# Patient Record
Sex: Male | Born: 1945 | Race: White | Hispanic: No | Smoking: Former smoker
Health system: Southern US, Community
[De-identification: ages and names within clinical notes are randomized; demographics above are authoritative.]

## PROBLEM LIST (undated history)

## (undated) DIAGNOSIS — L570 Actinic keratosis: Secondary | ICD-10-CM

## (undated) DIAGNOSIS — H269 Unspecified cataract: Secondary | ICD-10-CM

## (undated) DIAGNOSIS — H409 Unspecified glaucoma: Secondary | ICD-10-CM

## (undated) DIAGNOSIS — G4731 Primary central sleep apnea: Secondary | ICD-10-CM

## (undated) DIAGNOSIS — G4733 Obstructive sleep apnea (adult) (pediatric): Secondary | ICD-10-CM

## (undated) DIAGNOSIS — I10 Essential (primary) hypertension: Secondary | ICD-10-CM

## (undated) DIAGNOSIS — E079 Disorder of thyroid, unspecified: Secondary | ICD-10-CM

## (undated) DIAGNOSIS — M1000 Idiopathic gout, unspecified site: Secondary | ICD-10-CM

## (undated) DIAGNOSIS — M1990 Unspecified osteoarthritis, unspecified site: Secondary | ICD-10-CM

## (undated) DIAGNOSIS — T7840XA Allergy, unspecified, initial encounter: Secondary | ICD-10-CM

## (undated) DIAGNOSIS — G4730 Sleep apnea, unspecified: Secondary | ICD-10-CM

## (undated) DIAGNOSIS — C801 Malignant (primary) neoplasm, unspecified: Secondary | ICD-10-CM

## (undated) DIAGNOSIS — F32A Depression, unspecified: Secondary | ICD-10-CM

## (undated) DIAGNOSIS — Z974 Presence of external hearing-aid: Secondary | ICD-10-CM

## (undated) HISTORY — DX: Depression, unspecified: F32.A

## (undated) HISTORY — DX: Essential (primary) hypertension: I10

## (undated) HISTORY — DX: Malignant (primary) neoplasm, unspecified: C80.1

## (undated) HISTORY — PX: VASECTOMY: SHX75

## (undated) HISTORY — PX: MASTOIDECTOMY: SHX711

## (undated) HISTORY — DX: Unspecified cataract: H26.9

## (undated) HISTORY — DX: Allergy, unspecified, initial encounter: T78.40XA

## (undated) HISTORY — DX: Disorder of thyroid, unspecified: E07.9

## (undated) HISTORY — DX: Unspecified osteoarthritis, unspecified site: M19.90

## (undated) HISTORY — PX: OTHER SURGICAL HISTORY: SHX169

## (undated) HISTORY — DX: Actinic keratosis: L57.0

## (undated) HISTORY — DX: Unspecified glaucoma: H40.9

## (undated) HISTORY — PX: COSMETIC SURGERY: SHX468

---

## 2001-09-12 ENCOUNTER — Encounter: Payer: Self-pay | Admitting: Neurosurgery

## 2001-09-12 ENCOUNTER — Encounter: Admission: RE | Admit: 2001-09-12 | Discharge: 2001-09-12 | Payer: Self-pay | Admitting: Neurosurgery

## 2001-09-26 ENCOUNTER — Encounter: Admission: RE | Admit: 2001-09-26 | Discharge: 2001-09-26 | Payer: Self-pay | Admitting: Neurosurgery

## 2001-09-26 ENCOUNTER — Encounter: Payer: Self-pay | Admitting: Neurosurgery

## 2001-10-10 ENCOUNTER — Encounter: Admission: RE | Admit: 2001-10-10 | Discharge: 2001-10-10 | Payer: Self-pay | Admitting: Neurosurgery

## 2001-10-10 ENCOUNTER — Encounter: Payer: Self-pay | Admitting: Neurosurgery

## 2003-10-09 ENCOUNTER — Ambulatory Visit (HOSPITAL_BASED_OUTPATIENT_CLINIC_OR_DEPARTMENT_OTHER): Admission: RE | Admit: 2003-10-09 | Discharge: 2003-10-09 | Payer: Self-pay | Admitting: Family Medicine

## 2004-08-25 ENCOUNTER — Ambulatory Visit: Payer: Self-pay | Admitting: Unknown Physician Specialty

## 2008-09-02 ENCOUNTER — Ambulatory Visit: Payer: Self-pay

## 2009-12-21 ENCOUNTER — Ambulatory Visit: Payer: Self-pay | Admitting: Unknown Physician Specialty

## 2009-12-22 LAB — PATHOLOGY REPORT

## 2011-08-08 ENCOUNTER — Other Ambulatory Visit: Payer: Self-pay | Admitting: Family Medicine

## 2011-08-08 ENCOUNTER — Other Ambulatory Visit (INDEPENDENT_AMBULATORY_CARE_PROVIDER_SITE_OTHER): Payer: BC Managed Care – PPO

## 2011-08-08 DIAGNOSIS — Z0000 Encounter for general adult medical examination without abnormal findings: Secondary | ICD-10-CM

## 2011-08-08 DIAGNOSIS — Z136 Encounter for screening for cardiovascular disorders: Secondary | ICD-10-CM

## 2011-08-08 LAB — BASIC METABOLIC PANEL
BUN: 22 mg/dL (ref 6–23)
CO2: 33 mEq/L — ABNORMAL HIGH (ref 19–32)
Calcium: 9.3 mg/dL (ref 8.4–10.5)
Chloride: 103 mEq/L (ref 96–112)
Creatinine, Ser: 0.8 mg/dL (ref 0.4–1.5)
GFR: 103.19 mL/min (ref >60.00)
Glucose, Bld: 92 mg/dL (ref 70–99)
Potassium: 4.5 mEq/L (ref 3.5–5.1)
Sodium: 140 mEq/L (ref 135–145)

## 2011-08-08 LAB — LIPID PANEL
Cholesterol: 183 mg/dL (ref 0–200)
HDL: 38.8 mg/dL — ABNORMAL LOW (ref >39.00)
LDL Cholesterol: 120 mg/dL — ABNORMAL HIGH (ref 0–99)
Total CHOL/HDL Ratio: 5
Triglycerides: 119 mg/dL (ref 0.0–149.0)
VLDL: 23.8 mg/dL (ref 0.0–40.0)

## 2011-08-15 ENCOUNTER — Encounter: Payer: Self-pay | Admitting: Family Medicine

## 2011-08-15 ENCOUNTER — Ambulatory Visit (INDEPENDENT_AMBULATORY_CARE_PROVIDER_SITE_OTHER): Payer: BC Managed Care – PPO | Admitting: Family Medicine

## 2011-08-15 VITALS — BP 110/72 | HR 60 | Temp 98.4°F | Ht 69.75 in | Wt 194.0 lb

## 2011-08-15 DIAGNOSIS — Z0000 Encounter for general adult medical examination without abnormal findings: Secondary | ICD-10-CM

## 2011-08-15 DIAGNOSIS — E291 Testicular hypofunction: Secondary | ICD-10-CM

## 2011-08-15 DIAGNOSIS — E349 Endocrine disorder, unspecified: Secondary | ICD-10-CM | POA: Insufficient documentation

## 2011-08-15 DIAGNOSIS — I10 Essential (primary) hypertension: Secondary | ICD-10-CM

## 2011-08-15 NOTE — Progress Notes (Signed)
Subjective:    Patient ID: Austin Knapp, male    DOB: [birth year], 66 y.o.   MRN: [id number]  HPI  66 yo pt here to establish care and for CPX.  1.  HTN- Taking Prinizide-HCTZ 10-12.5 for past 4 or 5 years. 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 Has lost 30 pounds in last 3 years with diet and exercise. Has noticed at times that he is a little dizzy when he stands from a seated position. Has not taken his medication today and BP is 110/72.  Lab Results  Component Value Date   CREATININE 0.8 08/08/2011    2.  Low T- has been on Androgel for 3 years. Sex drive and energy have improved. Has not had labs drawn in over a year. No FH or personal h/o Prostate CA. Denies any symptoms of difficulty urinating.  Well man- UTD on all prevention, including Tdap which he thinks he had within the last 10 years.  Patient Active Problem List  Diagnoses  . Hypertension  . Low testosterone   Past Medical History  Diagnosis Date  . Hypertension   . Low testosterone    Past Surgical History  Procedure Date  . Vasectomy   . Mastoidectomy    History  Substance Use Topics  . Smoking status: Former Games developer  . Smokeless tobacco: Not on file  . Alcohol Use: Not on file   Family History  Problem Relation Age of Onset  . Macular degeneration Mother   . Cancer Father    Allergies  Allergen Reactions  . Clindamycin/Lincomycin Rash   Current outpatient prescriptions:aspirin 81 MG tablet, Take 81 mg by mouth daily., Disp: , Rfl: ;  calcium carbonate (OS-CAL) 600 MG TABS, Take 600 mg by mouth 2 (two) times daily with a meal., Disp: , Rfl: ;  fluticasone (FLONASE) 50 MCG/ACT nasal spray, One spray each nostril daily, Disp: , Rfl: ;  lisinopril-hydrochlorothiazide (PRINZIDE,ZESTORETIC) 10-12.5 MG per tablet, Take 1 tablet by mouth daily., Disp: , Rfl:  Multiple Vitamin (MULTIVITAMIN) tablet, Take 1 tablet by mouth daily., Disp: , Rfl: ;  Multiple Vitamins-Minerals (EYE  VITAMINS) CAPS, Take as directed, Disp: , Rfl: ;  Testosterone (ANDROGEL PUMP) 20.25 MG/ACT (1.62%) GEL, Use 2 pumps daily, Disp: , Rfl: ;  Zinc 50 MG CAPS, Take one by mouth daily, Disp: , Rfl:    The PMH, PSH, Social History, Family History, Medications, and allergies have been reviewed in [facility], and have been updated if relevant.   Review of Systems See HPI Patient reports no  vision/ hearing changes,anorexia, weight change, fever ,adenopathy, persistant / recurrent hoarseness, swallowing issues, chest pain, edema,persistant / recurrent cough, hemoptysis, dyspnea(rest, exertional, paroxysmal nocturnal), gastrointestinal  bleeding (melena, rectal bleeding), abdominal pain, excessive heart burn, GU symptoms(dysuria, hematuria, pyuria, voiding/incontinence  Issues) syncope, focal weakness, severe memory loss, concerning skin lesions, depression, anxiety, abnormal bruising/bleeding, major joint swelling.       Objective:   Physical Exam BP 110/72  Pulse 60  Temp(Src) 98.4 F (36.9 C) (Oral)  Ht 5' 9.75" (1.772 m)  Wt 194 lb (87.998 kg)  BMI 28.04 kg/m2 General:  overweght male in NAD Eyes:  PERRL Ears:  External ear exam shows no significant lesions or deformities.  Otoscopic examination reveals clear canals, tympanic membranes are intact bilaterally without bulging, retraction, inflammation or discharge. Hearing is grossly normal bilaterally. Nose:  External nasal examination shows no deformity or inflammation. Nasal mucosa are pink and moist without lesions or  exudates. Mouth:  Oral mucosa and oropharynx without lesions or exudates.  Teeth in good repair. Neck:  no carotid bruit or thyromegaly no cervical or supraclavicular lymphadenopathy  Lungs:  Normal respiratory effort, chest expands symmetrically. Lungs are clear to auscultation, no crackles or wheezes. Heart:  Normal rate and regular rhythm. S1 and S2 normal without gallop, murmur, click, rub or other extra sounds. Abdomen:  Bowel  sounds positive,abdomen soft and non-tender without masses, organomegaly or hernias noted. Pulses:  R and L posterior tibial pulses are full and equal bilaterally  Extremities:  no edema      Assessment & Plan:   1. Low testosterone  Recheck labs, continue current dose of androgel. CBC with Differential, PSA, Testosterone  2. Hypertension  Stable off meds. Will hold meds and advised to check BP at home daily for 2 weeks and call with a log. Likely can d/c now that he has lost weight and changed his diet. The patient indicates understanding of these issues and agrees with the plan.    3. Visit for well man health check  Reviewed preventive care protocols, scheduled due services, and updated immunizations Discussed nutrition, exercise, diet, and healthy lifestyle.

## 2011-08-15 NOTE — Patient Instructions (Signed)
It was wonderful to meet you. Please stop taking your blood pressure medication- call me in 2 weeks with an update of your daily blood pressure readings. We will call you with your lab results later this week. Please say hello to your daughter for me.

## 2011-08-16 ENCOUNTER — Other Ambulatory Visit (INDEPENDENT_AMBULATORY_CARE_PROVIDER_SITE_OTHER): Payer: BC Managed Care – PPO

## 2011-08-16 DIAGNOSIS — E291 Testicular hypofunction: Secondary | ICD-10-CM

## 2011-08-16 LAB — CBC WITH DIFFERENTIAL/PLATELET
Eosinophils Absolute: 0.3 10*3/uL (ref 0.0–0.7)
HCT: 48.5 % (ref 39.0–52.0)
Lymphs Abs: 1.6 10*3/uL (ref 0.7–4.0)
MCHC: 33.4 g/dL (ref 30.0–36.0)
MCV: 95.9 fl (ref 78.0–100.0)
Monocytes Absolute: 0.6 10*3/uL (ref 0.1–1.0)
Neutrophils Relative %: 60.3 % (ref 43.0–77.0)
Platelets: 166 10*3/uL (ref 150.0–400.0)

## 2011-08-16 LAB — PSA: PSA: 0.75 ng/mL (ref 0.10–4.00)

## 2011-08-16 LAB — TESTOSTERONE: Testosterone: 290.65 ng/dL — ABNORMAL LOW (ref 350.00–890.00)

## 2011-08-17 ENCOUNTER — Other Ambulatory Visit: Payer: Self-pay | Admitting: Family Medicine

## 2011-08-17 MED ORDER — TESTOSTERONE 40.5 MG/2.5GM (1.62%) TD GEL: 2.0000 "application " | Freq: Every day | TRANSDERMAL | Status: DC

## 2011-08-22 ENCOUNTER — Other Ambulatory Visit: Payer: Self-pay | Admitting: Family Medicine

## 2011-08-22 ENCOUNTER — Telehealth: Payer: Self-pay | Admitting: *Deleted

## 2011-08-22 MED ORDER — TESTOSTERONE 40.5 MG/2.5GM (1.62%) TD GEL: 4.0000 "application " | Freq: Every day | TRANSDERMAL | Status: DC

## 2011-08-22 NOTE — Telephone Encounter (Signed)
Would you mind calling his pharmacy to see what would be cheapest for him--4 pumps of current rx or different dose? Thanks!

## 2011-08-22 NOTE — Telephone Encounter (Signed)
Pharmacist says he can't answer that question, asks what you may be thinking about prescribing in place of the androgel.

## 2011-08-22 NOTE — Telephone Encounter (Signed)
It looks like other preparations are more expensive so I just increased from 2 pumps to 4 pumps daily. Please make sure we have a follow up lab visit scheduled for him in 8 to 12 weeks.

## 2011-08-22 NOTE — Telephone Encounter (Signed)
It looks like the script on 3/6 printed, and I dont know what happened to it, but pt says that is the same dose that he has been on- 1.62 % at two pumps daily- and he needs the dose increased.  Please advise.

## 2011-08-22 NOTE — Telephone Encounter (Signed)
I think we already sent in the higher dose. Can you verify this for me? Thanks.

## 2011-08-22 NOTE — Telephone Encounter (Signed)
Pt would like to increase his testosterone dose, since his las lab result was low.  Uses [facility] [address].

## 2011-08-22 NOTE — Telephone Encounter (Signed)
Script called to [facility], advised patient.  Follow up lab appt scheduled.  Quantity called in as 6 boxes, since that is what pharmacist said pt would need for a 3 month supply, one refill called in as well.

## 2011-08-26 ENCOUNTER — Other Ambulatory Visit: Payer: Self-pay | Admitting: Family Medicine

## 2011-08-26 MED ORDER — HYDROCHLOROTHIAZIDE 12.5 MG PO CAPS: 12.5000 mg | ORAL_CAPSULE | Freq: Every day | ORAL | Status: DC

## 2011-08-26 NOTE — Telephone Encounter (Signed)
Pt dropped off list of BP readings, ranging 120s-130s/70s-80s. Will d/c HCTZ-lisinopril combo and send in new rx for HCTZ for swelling.

## 2011-08-29 ENCOUNTER — Encounter: Payer: Self-pay | Admitting: Family Medicine

## 2011-11-03 ENCOUNTER — Other Ambulatory Visit: Payer: Self-pay | Admitting: *Deleted

## 2011-11-03 MED ORDER — TESTOSTERONE 40.5 MG/2.5GM (1.62%) TD GEL: 4.0000 "application " | Freq: Every day | TRANSDERMAL | Status: DC

## 2011-11-03 NOTE — Telephone Encounter (Signed)
Per pharmacy, this was not a refill request, it was a request for a prior auth, pharmacy will fax form again.

## 2011-11-04 ENCOUNTER — Telehealth: Payer: Self-pay | Admitting: *Deleted

## 2011-11-04 NOTE — Telephone Encounter (Signed)
Prior Austin Knapp is needed for androgel, form is on your desk.

## 2011-11-04 NOTE — Telephone Encounter (Signed)
In my desk.

## 2011-11-04 NOTE — Telephone Encounter (Signed)
[facility]

## 2011-11-08 NOTE — Telephone Encounter (Signed)
Prior auth given for androgel, advised pharmacy.  Approval letter placed on doctor's desk for signature and scanning. 

## 2011-11-16 ENCOUNTER — Ambulatory Visit (INDEPENDENT_AMBULATORY_CARE_PROVIDER_SITE_OTHER): Payer: BC Managed Care – PPO | Admitting: Family Medicine

## 2011-11-16 ENCOUNTER — Encounter: Payer: Self-pay | Admitting: Family Medicine

## 2011-11-16 VITALS — BP 118/80 | HR 64 | Temp 97.6°F | Wt 203.0 lb

## 2011-11-16 DIAGNOSIS — R590 Localized enlarged lymph nodes: Secondary | ICD-10-CM

## 2011-11-16 DIAGNOSIS — R599 Enlarged lymph nodes, unspecified: Secondary | ICD-10-CM

## 2011-11-16 NOTE — Progress Notes (Signed)
Subjective:    Patient ID: Austin Knapp, male    DOB: 11-May-1946, 66 y.o.   MRN: [id number]  HPI  66 yo here for swollen lymph node in neck x 1 month.  He feels it is getting larger.  Non tender.  Not warm. No fevers, chills. No cough.  Non smoker. No difficulty swallowing or recent infections.  Patient Active Problem List  Diagnoses  . Hypertension  . Low testosterone  . Visit for well man health check  . Lymphadenopathy of left cervical region   Past Medical History  Diagnosis Date  . Hypertension   . Low testosterone    Past Surgical History  Procedure Date  . Vasectomy   . Mastoidectomy    History  Substance Use Topics  . Smoking status: Former Games developer  . Smokeless tobacco: Not on file  . Alcohol Use: Not on file   Family History  Problem Relation Age of Onset  . Macular degeneration Mother   . Cancer Father    Allergies  Allergen Reactions  . Clindamycin/Lincomycin Rash   Current Outpatient Prescriptions on File Prior to Visit  Medication Sig Dispense Refill  . aspirin 81 MG tablet Take 81 mg by mouth daily.      . calcium carbonate (OS-CAL) 600 MG TABS Take 600 mg by mouth 2 (two) times daily with a meal.      . fluticasone (FLONASE) 50 MCG/ACT nasal spray One spray each nostril daily      . hydrochlorothiazide (MICROZIDE) 12.5 MG capsule Take 1 capsule (12.5 mg total) by mouth daily.  30 capsule  3  . Multiple Vitamin (MULTIVITAMIN) tablet Take 1 tablet by mouth daily.      . Multiple Vitamins-Minerals (EYE VITAMINS) CAPS Take as directed      . Testosterone (ANDROGEL) 40.5 MG/2.5GM (1.62%) GEL Place 4 application onto the skin daily.  2.5 g  3  . Zinc 50 MG CAPS Take one by mouth daily      . DISCONTD: lisinopril-hydrochlorothiazide (PRINZIDE,ZESTORETIC) 10-12.5 MG per tablet Take 1 tablet by mouth daily.       The PMH, PSH, Social History, Family History, Medications, and allergies have been reviewed in [facility], and have been updated if  relevant.    Review of Systems See HPI    Objective:   Physical Exam BP 118/80  Pulse 64  Temp 97.6 F (36.4 C)  Wt 203 lb (92.08 kg) General:  overweght male in NAD Eyes:  PERRL Ears:  External ear exam shows no significant lesions or deformities.  Otoscopic examination reveals clear canals, tympanic membranes are intact bilaterally without bulging, retraction, inflammation or discharge. Hearing is grossly normal bilaterally. Nose:  External nasal examination shows no deformity or inflammation. Nasal mucosa are pink and moist without lesions or exudates. Mouth:  Oral mucosa and oropharynx without lesions or exudates.  Teeth in good repair. Neck:  Right non matted large cervical node ( approx 2 cm), no other palpable lymphadenopathy Lungs:  Normal respiratory effort, chest expands symmetrically. Lungs are clear to auscultation, no crackles or wheezes. Heart:  Normal rate and regular rhythm. S1 and S2 normal without gallop, murmur, click, rub or other extra sounds.     Assessment & Plan:   1. Lymphadenopathy of left cervical region  Ambulatory referral to ENT   New- given duration of symptoms, will refer to ENT for immediate biopsy of node rather than blood work and xray.  If we can get an appointment with ENT immediately, will  order lab work and xray today. The patient indicates understanding of these issues and agrees with the plan.

## 2011-11-16 NOTE — Patient Instructions (Signed)
Please stop by to see Austin Knapp on your way out. 

## 2011-11-17 ENCOUNTER — Ambulatory Visit: Payer: BC Managed Care – PPO | Admitting: Family Medicine

## 2011-11-21 ENCOUNTER — Other Ambulatory Visit (INDEPENDENT_AMBULATORY_CARE_PROVIDER_SITE_OTHER): Payer: BC Managed Care – PPO

## 2011-11-21 ENCOUNTER — Other Ambulatory Visit: Payer: BC Managed Care – PPO

## 2011-11-21 DIAGNOSIS — E291 Testicular hypofunction: Secondary | ICD-10-CM

## 2011-11-21 LAB — CBC WITH DIFFERENTIAL/PLATELET
Basophils Absolute: 0 10*3/uL (ref 0.0–0.1)
Eosinophils Relative: 3.4 % (ref 0.0–5.0)
Monocytes Relative: 9.4 % (ref 3.0–12.0)
Neutrophils Relative %: 60.2 % (ref 43.0–77.0)
Platelets: 165 10*3/uL (ref 150.0–400.0)
RDW: 13.4 % (ref 11.5–14.6)
WBC: 6.9 10*3/uL (ref 4.5–10.5)

## 2011-11-21 LAB — HEPATIC FUNCTION PANEL
ALT: 29 U/L (ref 0–53)
AST: 23 U/L (ref 0–37)
Bilirubin, Direct: 0 mg/dL (ref 0.0–0.3)
Total Bilirubin: 0.3 mg/dL (ref 0.3–1.2)

## 2011-11-21 LAB — TESTOSTERONE: Testosterone: 219.65 ng/dL — ABNORMAL LOW (ref 350.00–890.00)

## 2011-11-22 ENCOUNTER — Ambulatory Visit: Payer: Self-pay | Admitting: Otolaryngology

## 2011-11-22 LAB — CREATININE, SERUM
Creatinine: 0.93 mg/dL (ref 0.60–1.30)
EGFR (African American): >60
EGFR (Non-African Amer.): >60

## 2011-11-23 ENCOUNTER — Encounter: Payer: Self-pay | Admitting: Family Medicine

## 2011-12-01 ENCOUNTER — Ambulatory Visit: Payer: Self-pay | Admitting: Anesthesiology

## 2011-12-01 DIAGNOSIS — Z01810 Encounter for preprocedural cardiovascular examination: Secondary | ICD-10-CM

## 2011-12-01 LAB — ELECTROLYTE PANEL
Anion Gap: 4 — ABNORMAL LOW (ref 7–16)
Chloride: 102 mmol/L (ref 98–107)
Co2: 33 mmol/L — ABNORMAL HIGH (ref 21–32)
Potassium: 4.6 mmol/L (ref 3.5–5.1)
Sodium: 139 mmol/L (ref 136–145)

## 2011-12-02 ENCOUNTER — Ambulatory Visit: Payer: Self-pay | Admitting: Otolaryngology

## 2011-12-07 ENCOUNTER — Ambulatory Visit: Payer: Self-pay | Admitting: Radiation Oncology

## 2011-12-07 LAB — PATHOLOGY REPORT

## 2011-12-12 ENCOUNTER — Ambulatory Visit: Payer: Self-pay | Admitting: Radiation Oncology

## 2011-12-29 LAB — COMPREHENSIVE METABOLIC PANEL
Albumin: 3.9 g/dL (ref 3.4–5.0)
Anion Gap: 9 (ref 7–16)
Bilirubin,Total: 0.7 mg/dL (ref 0.2–1.0)
Calcium, Total: 9 mg/dL (ref 8.5–10.1)
Chloride: 102 mmol/L (ref 98–107)
Co2: 29 mmol/L (ref 21–32)
EGFR (African American): >60
EGFR (Non-African Amer.): >60
Osmolality: 281 (ref 275–301)
Potassium: 4.4 mmol/L (ref 3.5–5.1)
Sodium: 140 mmol/L (ref 136–145)

## 2011-12-29 LAB — CBC CANCER CENTER
Basophil #: 0 x10 3/mm (ref 0.0–0.1)
Basophil %: 0.7 %
Eosinophil #: 0.2 x10 3/mm (ref 0.0–0.7)
HCT: 50.8 % (ref 40.0–52.0)
Lymphocyte %: 26.8 %
MCH: 31.3 pg (ref 26.0–34.0)
MCHC: 33.3 g/dL (ref 32.0–36.0)
Monocyte #: 0.6 x10 3/mm (ref 0.2–1.0)
Neutrophil #: 3.9 x10 3/mm (ref 1.4–6.5)
Neutrophil %: 60 %
Platelet: 181 x10 3/mm (ref 150–440)
RDW: 13.6 % (ref 11.5–14.5)

## 2012-01-05 LAB — CBC CANCER CENTER
Basophil %: 0.5 %
Eosinophil #: 0.1 x10 3/mm (ref 0.0–0.7)
Eosinophil %: 1.6 %
HCT: 49.5 % (ref 40.0–52.0)
HGB: 16.9 g/dL (ref 13.0–18.0)
Lymphocyte %: 20.5 %
MCH: 32.1 pg (ref 26.0–34.0)
MCV: 94 fL (ref 80–100)
Monocyte #: 0.6 x10 3/mm (ref 0.2–1.0)
Monocyte %: 8.3 %
Neutrophil #: 5.3 x10 3/mm (ref 1.4–6.5)
Neutrophil %: 69.1 %
Platelet: 167 x10 3/mm (ref 150–440)
RBC: 5.27 10*6/uL (ref 4.40–5.90)
WBC: 7.7 x10 3/mm (ref 3.8–10.6)

## 2012-01-12 ENCOUNTER — Ambulatory Visit: Payer: Self-pay | Admitting: Radiation Oncology

## 2012-01-12 LAB — CBC CANCER CENTER
Basophil %: 0.9 %
Eosinophil #: 0.1 x10 3/mm (ref 0.0–0.7)
HGB: 15.8 g/dL (ref 13.0–18.0)
Lymphocyte %: 22.9 %
MCHC: 33.7 g/dL (ref 32.0–36.0)
MCV: 95 fL (ref 80–100)
Monocyte #: 0.5 x10 3/mm (ref 0.2–1.0)
Neutrophil %: 64 %
RBC: 4.95 10*6/uL (ref 4.40–5.90)
WBC: 4.5 x10 3/mm (ref 3.8–10.6)

## 2012-01-14 ENCOUNTER — Other Ambulatory Visit: Payer: Self-pay | Admitting: Family Medicine

## 2012-01-19 LAB — CBC CANCER CENTER
Basophil %: 1.2 %
Eosinophil %: 3.7 %
HCT: 44.7 % (ref 40.0–52.0)
HGB: 15.3 g/dL (ref 13.0–18.0)
Lymphocyte %: 27.7 %
MCHC: 34.2 g/dL (ref 32.0–36.0)
Monocyte %: 12.9 %
Neutrophil %: 54.5 %
Platelet: 149 x10 3/mm — ABNORMAL LOW (ref 150–440)
RBC: 4.68 10*6/uL (ref 4.40–5.90)
WBC: 4.6 x10 3/mm (ref 3.8–10.6)

## 2012-01-26 LAB — CBC CANCER CENTER
Basophil %: 0.6 %
Eosinophil %: 1.7 %
HCT: 49.1 % (ref 40.0–52.0)
HGB: 16.2 g/dL (ref 13.0–18.0)
Lymphocyte #: 1.2 x10 3/mm (ref 1.0–3.6)
MCH: 31.3 pg (ref 26.0–34.0)
MCHC: 33 g/dL (ref 32.0–36.0)
MCV: 95 fL (ref 80–100)
Monocyte #: 0.6 x10 3/mm (ref 0.2–1.0)
Neutrophil #: 2.5 x10 3/mm (ref 1.4–6.5)
RBC: 5.17 10*6/uL (ref 4.40–5.90)
WBC: 4.4 x10 3/mm (ref 3.8–10.6)

## 2012-01-26 LAB — COMPREHENSIVE METABOLIC PANEL
Anion Gap: 7 (ref 7–16)
Bilirubin,Total: 0.6 mg/dL (ref 0.2–1.0)
Chloride: 100 mmol/L (ref 98–107)
Co2: 31 mmol/L (ref 21–32)
EGFR (African American): >60
Potassium: 4.3 mmol/L (ref 3.5–5.1)
SGOT(AST): 17 U/L (ref 15–37)
SGPT (ALT): 24 U/L (ref 12–78)

## 2012-02-02 LAB — CBC CANCER CENTER
Basophil #: 0 x10 3/mm (ref 0.0–0.1)
Eosinophil #: 0.1 x10 3/mm (ref 0.0–0.7)
Eosinophil %: 1.1 %
Lymphocyte %: 19.4 %
MCH: 31.4 pg (ref 26.0–34.0)
Monocyte %: 11.4 %
Neutrophil %: 67.6 %
Platelet: 155 x10 3/mm (ref 150–440)
RBC: 5.18 10*6/uL (ref 4.40–5.90)
RDW: 13.4 % (ref 11.5–14.5)
WBC: 4.6 x10 3/mm (ref 3.8–10.6)

## 2012-02-09 LAB — CBC CANCER CENTER
Basophil %: 0.9 %
Eosinophil %: 2.1 %
HCT: 44.3 % (ref 40.0–52.0)
HGB: 14.7 g/dL (ref 13.0–18.0)
Lymphocyte #: 0.6 x10 3/mm — ABNORMAL LOW (ref 1.0–3.6)
Lymphocyte %: 17.5 %
MCV: 95 fL (ref 80–100)
Monocyte %: 12.1 %
Neutrophil #: 2.5 x10 3/mm (ref 1.4–6.5)
RBC: 4.67 10*6/uL (ref 4.40–5.90)
RDW: 14.1 % (ref 11.5–14.5)
WBC: 3.6 x10 3/mm — ABNORMAL LOW (ref 3.8–10.6)

## 2012-02-12 ENCOUNTER — Ambulatory Visit: Payer: Self-pay | Admitting: Radiation Oncology

## 2012-02-16 LAB — CBC CANCER CENTER
Basophil %: 0.9 %
Eosinophil #: 0.1 x10 3/mm (ref 0.0–0.7)
Eosinophil %: 3.1 %
HCT: 41.2 % (ref 40.0–52.0)
HGB: 13.7 g/dL (ref 13.0–18.0)
Lymphocyte #: 0.6 x10 3/mm — ABNORMAL LOW (ref 1.0–3.6)
Lymphocyte %: 20.6 %
MCHC: 33.2 g/dL (ref 32.0–36.0)
MCV: 96 fL (ref 80–100)
Neutrophil #: 1.7 x10 3/mm (ref 1.4–6.5)
RBC: 4.27 10*6/uL — ABNORMAL LOW (ref 4.40–5.90)
RDW: 14.7 % — ABNORMAL HIGH (ref 11.5–14.5)

## 2012-03-08 LAB — CBC CANCER CENTER
Basophil #: 0 x10 3/mm (ref 0.0–0.1)
Basophil %: 1 %
Eosinophil #: 0.1 x10 3/mm (ref 0.0–0.7)
Eosinophil %: 1.3 %
HCT: 42.5 % (ref 40.0–52.0)
Lymphocyte #: 0.8 x10 3/mm — ABNORMAL LOW (ref 1.0–3.6)
MCH: 31.8 pg (ref 26.0–34.0)
MCV: 96 fL (ref 80–100)
Monocyte %: 16.7 %
Neutrophil #: 2.2 x10 3/mm (ref 1.4–6.5)
Neutrophil %: 58.9 %
RBC: 4.45 10*6/uL (ref 4.40–5.90)
WBC: 3.8 x10 3/mm (ref 3.8–10.6)

## 2012-03-08 LAB — COMPREHENSIVE METABOLIC PANEL
BUN: 17 mg/dL (ref 7–18)
Calcium, Total: 9.7 mg/dL (ref 8.5–10.1)
Co2: 30 mmol/L (ref 21–32)
EGFR (African American): >60
Glucose: 90 mg/dL (ref 65–99)
SGOT(AST): 21 U/L (ref 15–37)

## 2012-03-13 ENCOUNTER — Ambulatory Visit: Payer: Self-pay | Admitting: Radiation Oncology

## 2012-04-05 ENCOUNTER — Ambulatory Visit: Payer: Self-pay | Admitting: Otolaryngology

## 2012-05-08 ENCOUNTER — Other Ambulatory Visit: Payer: Self-pay | Admitting: Family Medicine

## 2012-05-08 ENCOUNTER — Telehealth: Payer: Self-pay | Admitting: *Deleted

## 2012-05-08 NOTE — Telephone Encounter (Signed)
Medicine called to [facility]. 

## 2012-05-08 NOTE — Telephone Encounter (Signed)
He is due for follow up labs in December (every 6 months).  Ok to refill 3 month supply but he needs to make sure he has lab appointment next month.  I know he has been through a lot lately- please let him know that I have been thinking about him.

## 2012-05-08 NOTE — Telephone Encounter (Signed)
Pt's androgel was called to [facility] this morning for 75 grams.  Pt's daughter states this is only a 15 day supply and is asking if quantity can be changed to 150 grams, or preferably wants a 3 month supply sent back to [facility].  Please advise.

## 2012-05-08 NOTE — Telephone Encounter (Signed)
Advised patient's daughter, she will change quantity at pharmacy and make sure that patient schedules lab appt for next month.

## 2012-06-11 ENCOUNTER — Ambulatory Visit (INDEPENDENT_AMBULATORY_CARE_PROVIDER_SITE_OTHER): Payer: BC Managed Care – PPO | Admitting: Family Medicine

## 2012-06-11 ENCOUNTER — Encounter: Payer: Self-pay | Admitting: Family Medicine

## 2012-06-11 VITALS — BP 134/82 | HR 69 | Temp 98.2°F | Ht 69.75 in | Wt 196.2 lb

## 2012-06-11 DIAGNOSIS — R1909 Other intra-abdominal and pelvic swelling, mass and lump: Secondary | ICD-10-CM

## 2012-06-11 DIAGNOSIS — R1900 Intra-abdominal and pelvic swelling, mass and lump, unspecified site: Secondary | ICD-10-CM

## 2012-06-11 NOTE — Progress Notes (Signed)
Subjective:    Patient ID: Austin Knapp, male    DOB: 12-30-1945, 66 y.o.   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 Hypertension  . Low testosterone  . Visit for well man health check  . Lymphadenopathy of left cervical region   Past Medical History  Diagnosis Date  . Hypertension   . Low testosterone    Past Surgical History  Procedure Date  . Vasectomy   . Mastoidectomy    History  Substance Use Topics  . Smoking status: Former Games developer  . Smokeless tobacco: Not on file  . Alcohol Use: Yes     Comment: rare   Family History  Problem Relation Age of Onset  . Macular degeneration Mother   . Cancer Father    Allergies  Allergen Reactions  . Clindamycin/Lincomycin Rash   Current Outpatient Prescriptions on File Prior to Visit  Medication Sig Dispense Refill  . ANDROGEL PUMP 20.25 MG/ACT (1.62%) GEL APPLY 4 PUMP DEPRESSIONS ONCE DAILY  75 g  0  . aspirin 81 MG tablet Take 81 mg by mouth daily.      . calcium carbonate (OS-CAL) 600 MG TABS Take 600 mg by mouth 2 (two) times daily with a meal.      . DOXAZOSIN MESYLATE PO Take 1 tablet by mouth at bedtime.      . fluticasone (FLONASE) 50 MCG/ACT nasal spray One spray each nostril daily      . hydrochlorothiazide (MICROZIDE) 12.5 MG capsule take 1 capsule by mouth once daily  30 capsule  11  . Multiple Vitamin  (MULTIVITAMIN) tablet Take 1 tablet by mouth daily.      . Zinc 50 MG CAPS Take one by mouth daily      . [DISCONTINUED] lisinopril-hydrochlorothiazide (PRINZIDE,ZESTORETIC) 10-12.5 MG per tablet Take 1 tablet by mouth daily.          Review of Systems Review of Systems  Constitutional: Negative for fever, appetite change, fatigue and unexpected weight change.  Eyes: Negative for pain and visual disturbance.  Respiratory: Negative for cough and shortness of breath.   Cardiovascular: Negative for cp or palpitations    Gastrointestinal: Negative for nausea, diarrhea and constipation.  Genitourinary: Negative for urgency and frequency.  Skin: Negative for pallor or rash   Neurological: Negative for weakness, light-headedness, numbness and headaches.  Hematological: Negative for adenopathy. Does not bruise/bleed easily.  Psychiatric/Behavioral: Negative for dysphoric mood. The patient is not nervous/anxious.         Objective:   Physical Exam  Constitutional: He appears well-developed and well-nourished. No distress.  Eyes: Conjunctivae normal and EOM are normal. Pupils are equal, round, and reactive to light. Right eye exhibits no discharge. Left eye exhibits no discharge. No scleral icterus.  Neck: Normal range of motion. Neck supple. No JVD present.       Some fullness of the neck at area of recent cancer treatment   Cardiovascular: Normal rate and regular rhythm.   Pulmonary/Chest: Effort normal and breath sounds normal.  Abdominal: Soft. Bowel sounds are normal. There is no tenderness. There is no rebound and no guarding. Hernia confirmed negative in the right inguinal area and confirmed negative in the left inguinal area.       Fullness in R groin area above testicle - is reducible and only tender on deep palpation  Unremarkable testicular exam   Genitourinary: Testes normal and penis normal.  Musculoskeletal: He exhibits no edema.  Lymphadenopathy:       Right: No inguinal  adenopathy present.       Left: No inguinal adenopathy present.  Skin: Skin is warm and dry. No rash noted. No erythema.  Psychiatric: He has a normal mood and affect.          Assessment & Plan:

## 2012-06-11 NOTE — Assessment & Plan Note (Signed)
On R side acutely after stooping/ crouching yesterday- acute in onset and now improved  Suspect hernia  Does not appear to be incarcerated  Ref to gen surgeon for further eval

## 2012-06-11 NOTE — Patient Instructions (Addendum)
We will do surgeon referral at check out  Do not strain at all  If pain suddenly worsens or lump gets bigger - alert [place] and go to the ER

## 2012-08-02 ENCOUNTER — Ambulatory Visit: Payer: Self-pay | Admitting: Oncology

## 2012-08-08 ENCOUNTER — Ambulatory Visit: Payer: Self-pay | Admitting: General Surgery

## 2012-08-08 LAB — POTASSIUM: Potassium: 3.7 mmol/L (ref 3.5–5.1)

## 2012-08-11 HISTORY — PX: HERNIA REPAIR: SHX51

## 2012-08-17 ENCOUNTER — Ambulatory Visit: Payer: Self-pay | Admitting: General Surgery

## 2012-09-27 ENCOUNTER — Other Ambulatory Visit: Payer: Self-pay | Admitting: Family Medicine

## 2012-10-29 ENCOUNTER — Other Ambulatory Visit: Payer: Self-pay | Admitting: Family Medicine

## 2012-10-29 DIAGNOSIS — Z136 Encounter for screening for cardiovascular disorders: Secondary | ICD-10-CM

## 2012-10-29 DIAGNOSIS — Z125 Encounter for screening for malignant neoplasm of prostate: Secondary | ICD-10-CM

## 2012-10-29 DIAGNOSIS — I10 Essential (primary) hypertension: Secondary | ICD-10-CM

## 2012-11-06 ENCOUNTER — Other Ambulatory Visit (INDEPENDENT_AMBULATORY_CARE_PROVIDER_SITE_OTHER): Payer: BC Managed Care – PPO

## 2012-11-06 DIAGNOSIS — I10 Essential (primary) hypertension: Secondary | ICD-10-CM

## 2012-11-06 DIAGNOSIS — Z125 Encounter for screening for malignant neoplasm of prostate: Secondary | ICD-10-CM

## 2012-11-06 DIAGNOSIS — Z136 Encounter for screening for cardiovascular disorders: Secondary | ICD-10-CM

## 2012-11-06 LAB — PSA, MEDICARE: PSA: 0.6 ng/ml (ref 0.10–4.00)

## 2012-11-06 LAB — COMPREHENSIVE METABOLIC PANEL
Alkaline Phosphatase: 46 U/L (ref 39–117)
BUN: 20 mg/dL (ref 6–23)
Creatinine, Ser: 0.9 mg/dL (ref 0.4–1.5)
Glucose, Bld: 82 mg/dL (ref 70–99)
Sodium: 139 mEq/L (ref 135–145)
Total Bilirubin: 0.9 mg/dL (ref 0.3–1.2)

## 2012-11-06 LAB — LIPID PANEL
Cholesterol: 162 mg/dL (ref 0–200)
HDL: 40.6 mg/dL (ref >39.00)
VLDL: 13.6 mg/dL (ref 0.0–40.0)

## 2012-11-12 ENCOUNTER — Other Ambulatory Visit: Payer: Medicare Other

## 2012-11-19 ENCOUNTER — Encounter: Payer: Self-pay | Admitting: *Deleted

## 2012-11-19 ENCOUNTER — Ambulatory Visit (INDEPENDENT_AMBULATORY_CARE_PROVIDER_SITE_OTHER): Payer: BC Managed Care – PPO | Admitting: Family Medicine

## 2012-11-19 ENCOUNTER — Encounter: Payer: Self-pay | Admitting: Family Medicine

## 2012-11-19 VITALS — BP 110/80 | HR 68 | Temp 97.9°F | Ht 69.75 in | Wt 197.0 lb

## 2012-11-19 DIAGNOSIS — E291 Testicular hypofunction: Secondary | ICD-10-CM

## 2012-11-19 DIAGNOSIS — Z0000 Encounter for general adult medical examination without abnormal findings: Secondary | ICD-10-CM

## 2012-11-19 DIAGNOSIS — C029 Malignant neoplasm of tongue, unspecified: Secondary | ICD-10-CM

## 2012-11-19 DIAGNOSIS — E349 Endocrine disorder, unspecified: Secondary | ICD-10-CM

## 2012-11-19 DIAGNOSIS — I10 Essential (primary) hypertension: Secondary | ICD-10-CM

## 2012-11-19 DIAGNOSIS — Z85810 Personal history of malignant neoplasm of tongue: Secondary | ICD-10-CM | POA: Insufficient documentation

## 2012-11-19 LAB — CBC WITH DIFFERENTIAL/PLATELET
Basophils Absolute: 0 10*3/uL (ref 0.0–0.1)
Eosinophils Absolute: 0.1 10*3/uL (ref 0.0–0.7)
Lymphocytes Relative: 17.9 % (ref 12.0–46.0)
MCHC: 33.6 g/dL (ref 30.0–36.0)
MCV: 95.4 fl (ref 78.0–100.0)
Monocytes Absolute: 0.5 10*3/uL (ref 0.1–1.0)
Neutrophils Relative %: 68.6 % (ref 43.0–77.0)
RDW: 13.6 % (ref 11.5–14.6)

## 2012-11-19 NOTE — Progress Notes (Signed)
Subjective:    Patient ID: Austin Knapp, male    DOB: 1945-10-04, 67 y.o.   MRN: [id number]  HPI  68 yo pleasant male here for Welcome to Medicare Visit.  I have personally reviewed the Medicare Annual Wellness questionnaire and have noted 1. The patient's medical and social history 2. Their use of alcohol, tobacco or illicit drugs 3. Their current medications and supplements 4. The patient's functional ability including ADL's, fall risks, home safety risks and hearing or visual             impairment. 5. Diet and physical activities 6. Evidence for depression or mood disorders  End of life wishes discussed and updated in Social History.    1.  HTN- on HCTZ 12. 5 mg daily. Denies any HA, blurred vision, LE edema or orthostasis.   Lab Results  Component Value Date   CREATININE 0.9 11/06/2012    2.  Low T- has been on Androgel for several years. Sex drive and energy have improved.  No FH or personal h/o Prostate CA. Denies any symptoms of difficulty urinating. Lab Results  Component Value Date   PSA 0.60 11/06/2012   PSA 0.75 08/16/2011   Lab Results  Component Value Date   TESTOSTERONE 219.65* 11/21/2011   3. Tongue Cancer-  Followed by [facility].  Doing well s/p chemo, radiation and excision. Last appt with [facility] in 09/2012- note reviewed.  No evidence of local or regional recurrence.  Has follow up with them next month.   S/p right inguinal hernia repair in 08/2012.  He is doing well.  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 Patient Active Problem List   Diagnosis Date Noted  . Tongue cancer 11/19/2012  . Lump in the groin 06/11/2012  . Visit for well man health check 08/15/2011  . Hypertension   . Testosterone deficiency    Past Medical History  Diagnosis Date  . Hypertension   . Low testosterone    Past Surgical History  Procedure Laterality Date  . Vasectomy    . Mastoidectomy    . Tongue excision      04/2012   History  Substance Use  Topics  . Smoking status: Former Games developer  . Smokeless tobacco: Not on file  . Alcohol Use: Yes     Comment: rare   Family History  Problem Relation Age of Onset  . Macular degeneration Mother   . Cancer Father    Allergies  Allergen Reactions  . Clindamycin/Lincomycin Rash   Current outpatient prescriptions:ANDROGEL PUMP 20.25 MG/ACT (1.62%) GEL, APPLY 4 PUMP DEPRESSIONS ONCE DAILY, Disp: 75 g, Rfl: 0;  aspirin 81 MG tablet, Take 81 mg by mouth daily., Disp: , Rfl: ;  calcium carbonate (OS-CAL) 600 MG TABS, Take 600 mg by mouth 2 (two) times daily with a meal., Disp: , Rfl: ;  DOXAZOSIN MESYLATE PO, Take 1 tablet by mouth at bedtime., Disp: , Rfl:  fluticasone (FLONASE) 50 MCG/ACT nasal spray, instill 1 spray into each nostril once daily, Disp: 16 g, Rfl: 1;  hydrochlorothiazide (MICROZIDE) 12.5 MG capsule, take 1 capsule by mouth once daily, Disp: 30 capsule, Rfl: 11;  Multiple Vitamin (MULTIVITAMIN) tablet, Take 1 tablet by mouth daily., Disp: , Rfl: ;  Zinc 50 MG CAPS, Take one by mouth daily, Disp: , Rfl:  [DISCONTINUED] lisinopril-hydrochlorothiazide (PRINZIDE,ZESTORETIC) 10-12.5 MG per tablet, Take 1 tablet by mouth daily., Disp: , Rfl:    The PMH, PSH, Social History, Family History, Medications, and allergies  have been reviewed in [facility], and have been updated if relevant.   Review of Systems See HPI Patient reports no  vision/ hearing changes,anorexia, weight change, fever ,adenopathy, persistant / recurrent hoarseness, swallowing issues, chest pain, edema,persistant / recurrent cough, hemoptysis, dyspnea(rest, exertional, paroxysmal nocturnal), gastrointestinal  bleeding (melena, rectal bleeding), abdominal pain, excessive heart burn, GU symptoms(dysuria, hematuria, pyuria, voiding/incontinence  Issues) syncope, focal weakness, severe memory loss, concerning skin lesions, depression, anxiety, abnormal bruising/bleeding, major joint swelling.       Objective:   Physical Exam BP  110/80  Pulse 68  Temp(Src) 97.9 F (36.6 C)  Ht 5' 9.75" (1.772 m)  Wt 197 lb (89.359 kg)  BMI 28.46 kg/m2 Wt Readings from Last 3 Encounters:  11/19/12 197 lb (89.359 kg)  06/11/12 196 lb 4 oz (89.018 kg)  11/16/11 203 lb (92.08 kg)    General:  overweght male in NAD Eyes:  PERRL Ears:  External ear exam shows no significant lesions or deformities.  Otoscopic examination reveals clear canals, tympanic membranes are intact bilaterally without bulging, retraction, inflammation or discharge. Hearing is grossly normal bilaterally. Nose:  External nasal examination shows no deformity or inflammation. Nasal mucosa are pink and moist without lesions or exudates. Mouth:  Oral mucosa and oropharynx without lesions or exudates.  Teeth in good repair. Neck:  no carotid bruit or thyromegaly no cervical or supraclavicular lymphadenopathy  Lungs:  Normal respiratory effort, chest expands symmetrically. Lungs are clear to auscultation, no crackles or wheezes. Heart:  Normal rate and regular rhythm. S1 and S2 normal without gallop, murmur, click, rub or other extra sounds. Abdomen:  Bowel sounds positive,abdomen soft and non-tender without masses, organomegaly or hernias noted. Pulses:  R and L posterior tibial pulses are full and equal bilaterally  Extremities:  no edema      Assessment & Plan:    1. Tongue cancer No signs of recurrence.  Followed by [facility].  2. Hypertension Well controlled on HCTZ.  3. Testosterone deficiency  - Testosterone  4 . Welcome to Medicare preventive visit The patients weight, height, BMI and visual acuity have been recorded in the chart I have made referrals, counseling and provided education to the patient based review of the above and I have provided the pt with a written personalized care plan for preventive services. EKG reassuring- sinus bradycardia. - EKG 12-Lead

## 2012-11-19 NOTE — Patient Instructions (Addendum)
Good to see you. Please give your family my love and keep me posted with Lauris Poag Grace's progress.  We will call you with your lab results.

## 2012-11-29 IMAGING — PT NM PET TUM IMG INITIAL (PI) SKULL BASE T - THIGH
1 series · 1 of 1 positions shown · non-contrast
Comparison: none

REASON FOR EXAM: possible tongue cancer w lymph node involvement
COMMENTS:

[Series 1047: results mm oncology reading · 1.31mm/px · 1 of 1 slices shown]
[im 1/1]
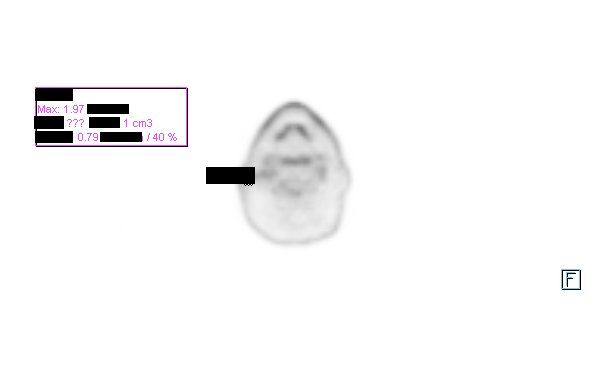

[1 of 1 positions shown; findings below may reference images not displayed]

PROCEDURE:     PET - PET/CT INIT STAG HEAD/NECK CA  - December 12, 2011  [DATE]

RESULT:

Procedure: Total body PET was performed in conjunction with a nonattenuation
CT status post left antecubital injection of 12.43 mCi of F-18 labeled
fluorodeoxyglucose. The patient's injection time is at [DATE] p.m. with an
initial scan start time [DATE] p.m. and a scan end time at [DATE] p.m. Delayed
imaging was obtained with a scan start time at [DATE] p.m. and a scan end time
at [DATE] p.m. The patient's fasting blood glucose was measured at 76 mg/dL.
FINDINGS: Head and neck: An amorphous area of intermediate hypermetabolic activity
projects within the inferior aspect of the right parotid space region. This
area demonstrates an SUV of 1.97 and projects in the region of amorphous
nodular attenuation in the subcutaneous fat.

No further regions of abnormal FDG avid hypermetabolic activity is
identified within the head or neck region, or within the chest, abdomen or
pelvis.

Appropriate biodistribution is identified in the region of the base of the
brain, heart, liver, spleen, bowel, urinary bladder and kidneys.
IMPRESSION: Indeterminate area of hypermetabolic activity within an amorphous region of
soft tissue attenuation in the inferior aspect of the right parotid space.
Clinical correlation is recommended.

## 2012-12-31 ENCOUNTER — Other Ambulatory Visit: Payer: Self-pay | Admitting: Family Medicine

## 2012-12-31 NOTE — Telephone Encounter (Signed)
androgel refill called to [facility].

## 2013-01-03 ENCOUNTER — Ambulatory Visit: Payer: Self-pay | Admitting: Otolaryngology

## 2013-01-15 ENCOUNTER — Other Ambulatory Visit: Payer: Self-pay | Admitting: Family Medicine

## 2013-01-15 ENCOUNTER — Encounter: Payer: Self-pay | Admitting: Family Medicine

## 2013-01-16 ENCOUNTER — Other Ambulatory Visit: Payer: Self-pay

## 2013-01-16 MED ORDER — TESTOSTERONE 20.25 MG/ACT (1.62%) TD GEL: 4.0000 "application " | Freq: Every day | TRANSDERMAL | Status: DC

## 2013-01-16 NOTE — Telephone Encounter (Signed)
Refill called to [facility]. 

## 2013-01-16 NOTE — Telephone Encounter (Signed)
plz phone in. 

## 2013-01-17 ENCOUNTER — Other Ambulatory Visit (INDEPENDENT_AMBULATORY_CARE_PROVIDER_SITE_OTHER): Payer: Self-pay

## 2013-02-19 ENCOUNTER — Other Ambulatory Visit: Payer: Self-pay | Admitting: *Deleted

## 2013-02-19 MED ORDER — HYDROCHLOROTHIAZIDE 12.5 MG PO CAPS: ORAL_CAPSULE | ORAL | Status: DC

## 2013-03-18 ENCOUNTER — Other Ambulatory Visit: Payer: Self-pay | Admitting: *Deleted

## 2013-03-18 MED ORDER — FLUTICASONE PROPIONATE 50 MCG/ACT NA SUSP: NASAL | Status: DC

## 2013-04-18 ENCOUNTER — Other Ambulatory Visit: Payer: Self-pay

## 2013-05-05 ENCOUNTER — Other Ambulatory Visit: Payer: Self-pay | Admitting: Family Medicine

## 2013-05-06 MED ORDER — TESTOSTERONE 20.25 MG/ACT (1.62%) TD GEL: 4.0000 "application " | Freq: Every day | TRANSDERMAL | Status: DC

## 2013-05-06 NOTE — Telephone Encounter (Signed)
Ok to phone in.

## 2013-05-06 NOTE — Telephone Encounter (Signed)
rx called into pharmacy

## 2013-05-06 NOTE — Telephone Encounter (Signed)
Ok to fill 

## 2013-08-16 ENCOUNTER — Other Ambulatory Visit: Payer: Self-pay | Admitting: Family Medicine

## 2013-08-26 ENCOUNTER — Other Ambulatory Visit: Payer: Self-pay | Admitting: *Deleted

## 2013-08-26 DIAGNOSIS — Z5181 Encounter for therapeutic drug level monitoring: Secondary | ICD-10-CM

## 2013-08-26 DIAGNOSIS — E349 Endocrine disorder, unspecified: Secondary | ICD-10-CM

## 2013-08-26 MED ORDER — TESTOSTERONE 20.25 MG/ACT (1.62%) TD GEL: 4.0000 "application " | Freq: Every day | TRANSDERMAL | Status: DC

## 2013-08-26 NOTE — Telephone Encounter (Signed)
Spoke to pt and informed him Rx has been called in to requested pharmacy; informed to RTO to have labs completed this week and pt verbally expressed understanding.

## 2013-08-26 NOTE — Telephone Encounter (Signed)
Ok to refill one time only but needs to come in for am labs (preferable as soon as we open on day this week) ASAP.  Lab orders entered.

## 2013-08-26 NOTE — Telephone Encounter (Signed)
Received refill request from pharmacy. Last filled 07/29/13, Last ov mcr cpx on 11/19/12.

## 2013-08-30 ENCOUNTER — Other Ambulatory Visit (INDEPENDENT_AMBULATORY_CARE_PROVIDER_SITE_OTHER): Payer: BC Managed Care – PPO

## 2013-08-30 ENCOUNTER — Ambulatory Visit: Payer: BC Managed Care – PPO

## 2013-08-30 DIAGNOSIS — Z5181 Encounter for therapeutic drug level monitoring: Secondary | ICD-10-CM

## 2013-08-30 DIAGNOSIS — C029 Malignant neoplasm of tongue, unspecified: Secondary | ICD-10-CM

## 2013-08-30 DIAGNOSIS — E291 Testicular hypofunction: Secondary | ICD-10-CM

## 2013-08-30 DIAGNOSIS — E349 Endocrine disorder, unspecified: Secondary | ICD-10-CM

## 2013-08-30 LAB — HEPATIC FUNCTION PANEL
ALBUMIN: 4.3 g/dL (ref 3.5–5.2)
ALT: 26 U/L (ref 0–53)
AST: 20 U/L (ref 0–37)
Alkaline Phosphatase: 51 U/L (ref 39–117)
BILIRUBIN TOTAL: 1 mg/dL (ref 0.3–1.2)
Bilirubin, Direct: 0.1 mg/dL (ref 0.0–0.3)
Total Protein: 6.5 g/dL (ref 6.0–8.3)

## 2013-08-30 LAB — CBC WITH DIFFERENTIAL/PLATELET
Basophils Absolute: 0 10*3/uL (ref 0.0–0.1)
Basophils Relative: 0.6 % (ref 0.0–3.0)
Eosinophils Absolute: 0.2 10*3/uL (ref 0.0–0.7)
Eosinophils Relative: 2.7 % (ref 0.0–5.0)
HCT: 47.8 % (ref 39.0–52.0)
Hemoglobin: 16 g/dL (ref 13.0–17.0)
LYMPHS ABS: 1.5 10*3/uL (ref 0.7–4.0)
Lymphocytes Relative: 25.8 % (ref 12.0–46.0)
MCHC: 33.6 g/dL (ref 30.0–36.0)
MCV: 95.7 fl (ref 78.0–100.0)
MONO ABS: 0.4 10*3/uL (ref 0.1–1.0)
Monocytes Relative: 7.5 % (ref 3.0–12.0)
NEUTROS PCT: 63.4 % (ref 43.0–77.0)
Neutro Abs: 3.8 10*3/uL (ref 1.4–7.7)
Platelets: 163 10*3/uL (ref 150.0–400.0)
RBC: 5 Mil/uL (ref 4.22–5.81)
RDW: 13.4 % (ref 11.5–14.6)
WBC: 5.9 10*3/uL (ref 4.5–10.5)

## 2013-08-30 LAB — TSH: TSH: 6.5 u[IU]/mL — ABNORMAL HIGH (ref 0.35–5.50)

## 2013-08-30 LAB — PSA: PSA: 0.76 ng/mL (ref 0.10–4.00)

## 2013-08-30 LAB — TESTOSTERONE: Testosterone: 401.53 ng/dL (ref 350.00–890.00)

## 2013-08-30 LAB — T4, FREE: Free T4: 0.67 ng/dL (ref 0.60–1.60)

## 2013-09-02 ENCOUNTER — Other Ambulatory Visit: Payer: Self-pay | Admitting: *Deleted

## 2013-09-02 MED ORDER — FLUTICASONE PROPIONATE 50 MCG/ACT NA SUSP: NASAL | Status: DC

## 2013-10-17 ENCOUNTER — Telehealth: Payer: Self-pay | Admitting: Family Medicine

## 2013-10-17 ENCOUNTER — Encounter: Payer: Self-pay | Admitting: Family Medicine

## 2013-10-17 ENCOUNTER — Ambulatory Visit (INDEPENDENT_AMBULATORY_CARE_PROVIDER_SITE_OTHER): Payer: BC Managed Care – PPO | Admitting: Family Medicine

## 2013-10-17 VITALS — BP 128/88 | HR 51 | Temp 98.0°F | Wt 203.5 lb

## 2013-10-17 DIAGNOSIS — I10 Essential (primary) hypertension: Secondary | ICD-10-CM

## 2013-10-17 DIAGNOSIS — E291 Testicular hypofunction: Secondary | ICD-10-CM

## 2013-10-17 DIAGNOSIS — H6120 Impacted cerumen, unspecified ear: Secondary | ICD-10-CM

## 2013-10-17 DIAGNOSIS — H918X9 Other specified hearing loss, unspecified ear: Secondary | ICD-10-CM

## 2013-10-17 DIAGNOSIS — E349 Endocrine disorder, unspecified: Secondary | ICD-10-CM

## 2013-10-17 DIAGNOSIS — R7989 Other specified abnormal findings of blood chemistry: Secondary | ICD-10-CM | POA: Insufficient documentation

## 2013-10-17 DIAGNOSIS — R946 Abnormal results of thyroid function studies: Secondary | ICD-10-CM

## 2013-10-17 MED ORDER — HYDROCHLOROTHIAZIDE 12.5 MG PO CAPS: ORAL_CAPSULE | ORAL | Status: DC

## 2013-10-17 MED ORDER — TESTOSTERONE 20.25 MG/ACT (1.62%) TD GEL: 4.0000 "application " | Freq: Every day | TRANSDERMAL | Status: DC

## 2013-10-17 NOTE — Assessment & Plan Note (Signed)
Continue current rx.

## 2013-10-17 NOTE — Progress Notes (Signed)
Subjective:    Patient ID: Austin Knapp, male    DOB: [birth year], 68 y.o.   MRN: [id number]  HPI  68 yo pleasant male here med refills.    1.  HTN- on HCTZ 12. 5 mg daily. Denies any HA, blurred vision, LE edema or orthostasis.   Lab Results  Component Value Date   CREATININE 0.9 11/06/2012    2.  Low T- has been on Androgel for several years. Sex drive and energy have improved.  No FH or personal h/o Prostate CA. Denies any symptoms of difficulty urinating. Lab Results  Component Value Date   TESTOSTERONE 401.53 08/30/2013   Lab Results  Component Value Date   WBC 5.9 08/30/2013   HGB 16.0 08/30/2013   HCT 47.8 08/30/2013   MCV 95.7 08/30/2013   PLT 163.0 08/30/2013    Lab Results  Component Value Date   PSA 0.76 08/30/2013   PSA 0.60 11/06/2012   PSA 0.75 08/16/2011   Lab Results  Component Value Date   TESTOSTERONE 401.53 08/30/2013   3. Tongue Cancer-  Followed by [facility].  Doing well s/p chemo, radiation and excision. Last appt with [facility] in 10/02/13- note reviewed.  No evidence of local or regional recurrence. Follow up in 3-4 months.  He did recommend checking thyroid function due to his radiation treatment which we did do here.  4.  Elevated TSH- Lab Results  Component Value Date   TSH 6.50* 08/30/2013   Normal (low normal) Ft4.  Denies any symptoms of hypothyroidism other than a little weight gain. Wt Readings from Last 3 Encounters:  10/17/13 203 lb 8 oz (92.307 kg)  11/19/12 197 lb (89.359 kg)  06/11/12 196 lb 4 oz (89.018 kg)     4.  Hearing loss left ear- past few months. No ear pain or drainage. Patient Active Problem List   Diagnosis Date Noted  . Tongue cancer 11/19/2012  . Lump in the groin 06/11/2012  . Hypertension   . Testosterone deficiency    Past Medical History  Diagnosis Date  . Hypertension   . Low testosterone    Past Surgical History  Procedure Laterality Date  . Vasectomy    . Mastoidectomy    . Tongue excision      04/2012   . Hernia repair Right 08/2012   History  Substance Use Topics  . Smoking status: Former Research scientist (life sciences)  . Smokeless tobacco: Not on file  . Alcohol Use: Yes     Comment: rare   Family History  Problem Relation Age of Onset  . Macular degeneration Mother   . Cancer Father    Allergies  Allergen Reactions  . Clindamycin/Lincomycin Rash   Current outpatient prescriptions:aspirin 81 MG tablet, Take 81 mg by mouth daily., Disp: , Rfl: ;  calcium carbonate (OS-CAL) 600 MG TABS, Take 600 mg by mouth 2 (two) times daily with a meal., Disp: , Rfl: ;  DOXAZOSIN MESYLATE PO, Take 1 tablet by mouth at bedtime., Disp: , Rfl: ;  fluticasone (FLONASE) 50 MCG/ACT nasal spray, instill 1 spray into each nostril once daily, Disp: 16 g, Rfl: 1 hydrochlorothiazide (MICROZIDE) 12.5 MG capsule, take 1 capsule by mouth once daily, Disp: 30 capsule, Rfl: 0;  LUTEIN PO, Take 1 tablet by mouth daily., Disp: , Rfl: ;  Multiple Vitamin (MULTIVITAMIN) tablet, Take 1 tablet by mouth daily., Disp: , Rfl: ;  Testosterone (ANDROGEL PUMP) 20.25 MG/ACT (1.62%) GEL, Apply 4 application topically daily., Disp: 450 g, Rfl: 0;  Zinc 50 MG CAPS, Take one by mouth daily, Disp: , Rfl:  [DISCONTINUED] lisinopril-hydrochlorothiazide (PRINZIDE,ZESTORETIC) 10-12.5 MG per tablet, Take 1 tablet by mouth daily., Disp: , Rfl:    The PMH, PSH, Social History, Family History, Medications, and allergies have been reviewed in [facility], and have been updated if relevant.   Review of Systems See HPI     Objective:   Physical Exam BP 128/88  Pulse 51  Temp(Src) 98 F (36.7 C) (Oral)  Wt 203 lb 8 oz (92.307 kg)  SpO2 96% Wt Readings from Last 3 Encounters:  10/17/13 203 lb 8 oz (92.307 kg)  11/19/12 197 lb (89.359 kg)  06/11/12 196 lb 4 oz (89.018 kg)    General:  overweght male in NAD Eyes:  PERRL Ears:   Left ear:  Cerumen impaction Right TM clear Extremities:  no edema      Assessment & Plan:

## 2013-10-17 NOTE — Progress Notes (Signed)
Pre visit review using our clinic review tool, if applicable. No additional management support is needed unless otherwise documented below in the visit note. 

## 2013-10-17 NOTE — Telephone Encounter (Signed)
Relevant patient education assigned to patient using Emmi. ° °

## 2013-10-17 NOTE — Assessment & Plan Note (Signed)
Well controlled on current rx.

## 2013-10-17 NOTE — Assessment & Plan Note (Signed)
He would like to try debrox OTC before irrigation. Call or return to clinic prn if these symptoms worsen or fail to improve as anticipated.

## 2013-10-17 NOTE — Patient Instructions (Signed)
Great to see you. Please return in 2 months for blood work- we will recheck your thyroid at this time.

## 2013-10-17 NOTE — Assessment & Plan Note (Signed)
With low normal FT4.  Asymptomatic.  Recheck thyroid function in 2 months. The patient indicates understanding of these issues and agrees with the plan.

## 2013-11-13 ENCOUNTER — Telehealth: Payer: Self-pay

## 2013-11-13 NOTE — Telephone Encounter (Signed)
Pt left v/m returning [facility] call; pt request cb ASAP.

## 2013-11-13 NOTE — Telephone Encounter (Signed)
Spoke to pt and informed him that i did not attempt to contact him. Looked through pts chart and cannot see where anyone is in this office attempted to contact him. No labs, or referrals noted

## 2013-11-18 ENCOUNTER — Other Ambulatory Visit: Payer: Self-pay | Admitting: Family Medicine

## 2013-11-18 NOTE — Telephone Encounter (Signed)
Spoke to pt and informed him Rx has been called in to requested pharmacy 

## 2013-11-18 NOTE — Telephone Encounter (Signed)
Pt requesting medication refill. Last f/u ov 10/2013 with no future appts scheduled. pls advise

## 2013-12-10 ENCOUNTER — Other Ambulatory Visit: Payer: Self-pay | Admitting: Family Medicine

## 2013-12-10 DIAGNOSIS — Z136 Encounter for screening for cardiovascular disorders: Secondary | ICD-10-CM

## 2013-12-10 DIAGNOSIS — R7989 Other specified abnormal findings of blood chemistry: Secondary | ICD-10-CM

## 2013-12-10 DIAGNOSIS — E349 Endocrine disorder, unspecified: Secondary | ICD-10-CM

## 2013-12-17 ENCOUNTER — Other Ambulatory Visit (INDEPENDENT_AMBULATORY_CARE_PROVIDER_SITE_OTHER): Payer: BC Managed Care – PPO

## 2013-12-17 DIAGNOSIS — R7989 Other specified abnormal findings of blood chemistry: Secondary | ICD-10-CM

## 2013-12-17 DIAGNOSIS — R946 Abnormal results of thyroid function studies: Secondary | ICD-10-CM

## 2013-12-17 DIAGNOSIS — Z136 Encounter for screening for cardiovascular disorders: Secondary | ICD-10-CM

## 2013-12-17 LAB — COMPREHENSIVE METABOLIC PANEL
ALBUMIN: 4.1 g/dL (ref 3.5–5.2)
ALT: 19 U/L (ref 0–53)
AST: 23 U/L (ref 0–37)
Alkaline Phosphatase: 40 U/L (ref 39–117)
BUN: 21 mg/dL (ref 6–23)
CALCIUM: 9.2 mg/dL (ref 8.4–10.5)
CHLORIDE: 104 meq/L (ref 96–112)
CO2: 28 mEq/L (ref 19–32)
Creatinine, Ser: 0.8 mg/dL (ref 0.4–1.5)
GFR: 98.18 mL/min (ref >60.00)
GLUCOSE: 100 mg/dL — AB (ref 70–99)
Potassium: 4.4 mEq/L (ref 3.5–5.1)
Sodium: 138 mEq/L (ref 135–145)
Total Bilirubin: 1.2 mg/dL (ref 0.2–1.2)
Total Protein: 6.5 g/dL (ref 6.0–8.3)

## 2013-12-17 LAB — T4, FREE: Free T4: 0.74 ng/dL (ref 0.60–1.60)

## 2013-12-17 LAB — T3, FREE: T3, Free: 2.7 pg/mL (ref 2.3–4.2)

## 2013-12-17 LAB — LIPID PANEL
Cholesterol: 180 mg/dL (ref 0–200)
HDL: 39.8 mg/dL (ref >39.00)
LDL Cholesterol: 122 mg/dL — ABNORMAL HIGH (ref 0–99)
NONHDL: 140.2
TRIGLYCERIDES: 90 mg/dL (ref 0.0–149.0)
Total CHOL/HDL Ratio: 5
VLDL: 18 mg/dL (ref 0.0–40.0)

## 2013-12-17 LAB — TSH: TSH: 5.06 u[IU]/mL — ABNORMAL HIGH (ref 0.35–4.50)

## 2013-12-23 ENCOUNTER — Encounter: Payer: Self-pay | Admitting: Family Medicine

## 2014-04-25 ENCOUNTER — Other Ambulatory Visit: Payer: Self-pay | Admitting: *Deleted

## 2014-04-25 MED ORDER — FLUTICASONE PROPIONATE 50 MCG/ACT NA SUSP: NASAL | Status: AC

## 2014-08-06 ENCOUNTER — Other Ambulatory Visit: Payer: Self-pay | Admitting: Family Medicine

## 2014-08-06 DIAGNOSIS — R7989 Other specified abnormal findings of blood chemistry: Secondary | ICD-10-CM

## 2014-08-06 DIAGNOSIS — Z0000 Encounter for general adult medical examination without abnormal findings: Secondary | ICD-10-CM

## 2014-08-11 ENCOUNTER — Other Ambulatory Visit (INDEPENDENT_AMBULATORY_CARE_PROVIDER_SITE_OTHER): Payer: Medicare Other

## 2014-08-11 DIAGNOSIS — Z0000 Encounter for general adult medical examination without abnormal findings: Secondary | ICD-10-CM | POA: Diagnosis not present

## 2014-08-11 DIAGNOSIS — R946 Abnormal results of thyroid function studies: Secondary | ICD-10-CM

## 2014-08-11 DIAGNOSIS — Z125 Encounter for screening for malignant neoplasm of prostate: Secondary | ICD-10-CM | POA: Diagnosis not present

## 2014-08-11 DIAGNOSIS — R7989 Other specified abnormal findings of blood chemistry: Secondary | ICD-10-CM

## 2014-08-11 DIAGNOSIS — I10 Essential (primary) hypertension: Secondary | ICD-10-CM

## 2014-08-11 LAB — COMPREHENSIVE METABOLIC PANEL
ALT: 22 U/L (ref 0–53)
AST: 19 U/L (ref 0–37)
Albumin: 4.4 g/dL (ref 3.5–5.2)
Alkaline Phosphatase: 53 U/L (ref 39–117)
BILIRUBIN TOTAL: 0.8 mg/dL (ref 0.2–1.2)
BUN: 24 mg/dL — ABNORMAL HIGH (ref 6–23)
CHLORIDE: 104 meq/L (ref 96–112)
CO2: 31 mEq/L (ref 19–32)
Calcium: 10 mg/dL (ref 8.4–10.5)
Creatinine, Ser: 1.05 mg/dL (ref 0.40–1.50)
GFR: 74.7 mL/min (ref >60.00)
Glucose, Bld: 95 mg/dL (ref 70–99)
Potassium: 4.7 mEq/L (ref 3.5–5.1)
Sodium: 141 mEq/L (ref 135–145)
TOTAL PROTEIN: 6.8 g/dL (ref 6.0–8.3)

## 2014-08-11 LAB — CBC WITH DIFFERENTIAL/PLATELET
Basophils Absolute: 0 K/uL (ref 0.0–0.1)
Basophils Relative: 0.6 % (ref 0.0–3.0)
Eosinophils Absolute: 0.2 K/uL (ref 0.0–0.7)
Eosinophils Relative: 3.1 % (ref 0.0–5.0)
HCT: 49.1 % (ref 39.0–52.0)
Hemoglobin: 16.9 g/dL (ref 13.0–17.0)
Lymphocytes Relative: 20.6 % (ref 12.0–46.0)
Lymphs Abs: 1.2 K/uL (ref 0.7–4.0)
MCHC: 34.4 g/dL (ref 30.0–36.0)
MCV: 93.6 fl (ref 78.0–100.0)
Monocytes Absolute: 0.5 K/uL (ref 0.1–1.0)
Monocytes Relative: 9.5 % (ref 3.0–12.0)
Neutro Abs: 3.8 K/uL (ref 1.4–7.7)
Neutrophils Relative %: 66.2 % (ref 43.0–77.0)
Platelets: 176 K/uL (ref 150.0–400.0)
RBC: 5.24 Mil/uL (ref 4.22–5.81)
RDW: 13.8 % (ref 11.5–15.5)
WBC: 5.7 K/uL (ref 4.0–10.5)

## 2014-08-11 LAB — LIPID PANEL
Cholesterol: 194 mg/dL (ref 0–200)
HDL: 43.3 mg/dL (ref >39.00)
LDL CALC: 129 mg/dL — AB (ref 0–99)
NONHDL: 150.7
Total CHOL/HDL Ratio: 4
Triglycerides: 107 mg/dL (ref 0.0–149.0)
VLDL: 21.4 mg/dL (ref 0.0–40.0)

## 2014-08-11 LAB — T4, FREE: Free T4: 0.69 ng/dL (ref 0.60–1.60)

## 2014-08-11 LAB — TSH: TSH: 5.57 u[IU]/mL — ABNORMAL HIGH (ref 0.35–4.50)

## 2014-08-11 LAB — PSA: PSA: 0.94 ng/mL (ref 0.10–4.00)

## 2014-08-14 ENCOUNTER — Encounter: Payer: Self-pay | Admitting: Family Medicine

## 2014-08-14 ENCOUNTER — Ambulatory Visit (INDEPENDENT_AMBULATORY_CARE_PROVIDER_SITE_OTHER): Payer: Medicare Other | Admitting: Family Medicine

## 2014-08-14 VITALS — BP 132/80 | HR 56 | Temp 97.7°F | Ht 69.5 in | Wt 197.0 lb

## 2014-08-14 DIAGNOSIS — G4733 Obstructive sleep apnea (adult) (pediatric): Secondary | ICD-10-CM

## 2014-08-14 DIAGNOSIS — E349 Endocrine disorder, unspecified: Secondary | ICD-10-CM

## 2014-08-14 DIAGNOSIS — M79642 Pain in left hand: Secondary | ICD-10-CM

## 2014-08-14 DIAGNOSIS — Z0000 Encounter for general adult medical examination without abnormal findings: Secondary | ICD-10-CM

## 2014-08-14 DIAGNOSIS — C029 Malignant neoplasm of tongue, unspecified: Secondary | ICD-10-CM

## 2014-08-14 DIAGNOSIS — Z9989 Dependence on other enabling machines and devices: Secondary | ICD-10-CM

## 2014-08-14 DIAGNOSIS — R7989 Other specified abnormal findings of blood chemistry: Secondary | ICD-10-CM

## 2014-08-14 DIAGNOSIS — I10 Essential (primary) hypertension: Secondary | ICD-10-CM

## 2014-08-14 DIAGNOSIS — R946 Abnormal results of thyroid function studies: Secondary | ICD-10-CM

## 2014-08-14 NOTE — Addendum Note (Signed)
Addended by: Lucille Passy on: 08/14/2014 11:53 AM   Modules accepted: Level of Service, SmartSet

## 2014-08-14 NOTE — Assessment & Plan Note (Signed)
Consistent with tendonitis. Will give exercises to do.  Advised ICE, NSAIDs sparingly prn with food. The patient indicates understanding of these issues and agrees with the plan.

## 2014-08-14 NOTE — Progress Notes (Signed)
Subjective:   Patient ID: Austin Knapp, male    DOB: 01-09-1946, 69 y.o.   MRN: [id number]  Austin Knapp is a pleasant 69 y.o. year old male who presents to clinic today with Annual Exam and Hand Pain  on 08/14/2014  HPI: I have personally reviewed the Medicare Annual Wellness questionnaire and have noted 1. The patient's medical and social history 2. Their use of alcohol, tobacco or illicit drugs 3. Their current medications and supplements 4. The patient's functional ability including ADL's, fall risks, home safety risks and hearing or visual             impairment. 5. Diet and physical activities 6. Evidence for depression or mood disorders  End of life wishes discussed and updated in Social History.  Doing well.  Just retired.  Austin Knapp, his daughter, is having a baby in 2 weeks.  He will be the baby's primary caregiver and so excited.  The roster of all physicians providing medical care to patient - is listed in the Snapshot section of the chart. Eye exam- Austin Knapp- 10/2013 Colonoscopy 12/21/09- Austin Knapp  Per pt, had pneumovax 03/25/13, Tdap, Influenza and zoster done at [facility] (daughter is a Software engineer)- he will send [place] this documentation.  High TSH- has been hypothyroid on lab work for last several visits.  Oncologist aware due to his h/o radiation.  Austin Knapp is not having any symptoms and prefers to just watch this and fax results to his ENT.  Left hand pain- has noticed more pain in the base of his left thumb.  No swelling.  Only notices it when he has been very active with his hands, particularly when he turns doorknobs or opens jars.  HTN- has been taking HCTZ 12.5 mg daily for years without issue. Denies HA, blurred vision, CP or SOB.  Lab Results  Component Value Date   CREATININE 1.05 08/11/2014   Low T- has been taking Androgel for years. No personal or family history prostate CA. Lab Results  Component Value Date   TESTOSTERONE 401.53 08/30/2013   Lab  Results  Component Value Date   WBC 5.7 08/11/2014   HGB 16.9 08/11/2014   HCT 49.1 08/11/2014   MCV 93.6 08/11/2014   PLT 176.0 08/11/2014   Lab Results  Component Value Date   PSA 0.94 08/11/2014   PSA 0.76 08/30/2013   PSA 0.60 11/06/2012    Tongue cancer- followed by [facility].  Doing well. Last appointment was on 05/3014- Austin Knapp.  Note reviewed.  Extended follow up to every 4-5 months now- gets laryngoscopy at every visit.  CPAP- on OSA, followed by [place]. Current Outpatient Prescriptions on File Prior to Visit  Medication Sig Dispense Refill  . ANDROGEL PUMP 20.25 MG/ACT (1.62%) GEL APPLY 4 PUMPS ONCE DAILY AS DIRECTED. 450 g 0  . aspirin 81 MG tablet Take 81 mg by mouth daily.    . calcium carbonate (OS-CAL) 600 MG TABS Take 600 mg by mouth 2 (two) times daily with a meal.    . DOXAZOSIN MESYLATE PO Take 1 tablet by mouth at bedtime.    . fluticasone (FLONASE) 50 MCG/ACT nasal spray instill 1 spray into each nostril once daily 16 g 1  . hydrochlorothiazide (MICROZIDE) 12.5 MG capsule take 1 capsule by mouth once daily 30 capsule 6  . LUTEIN PO Take 1 tablet by mouth daily.    . Multiple Vitamin (MULTIVITAMIN) tablet Take 1 tablet by mouth daily.    Marland Kitchen  Zinc 50 MG CAPS Take one by mouth daily    . [DISCONTINUED] lisinopril-hydrochlorothiazide (PRINZIDE,ZESTORETIC) 10-12.5 MG per tablet Take 1 tablet by mouth daily.     No current facility-administered medications on file prior to visit.    Allergies  Allergen Reactions  . Clindamycin/Lincomycin Rash    Past Medical History  Diagnosis Date  . Hypertension   . Low testosterone     Past Surgical History  Procedure Laterality Date  . Vasectomy    . Mastoidectomy    . Tongue excision      04/2012  . Hernia repair Right 08/2012    Family History  Problem Relation Age of Onset  . Macular degeneration Mother   . Cancer Father     History   Social History  . Marital Status: Married    Spouse Name: N/A  .  Number of Children: N/A  . Years of Education: N/A   Occupational History  . Not on file.   Social History Main Topics  . Smoking status: Former Research scientist (life sciences)  . Smokeless tobacco: Not on file  . Alcohol Use: Yes     Comment: rare  . Drug Use: No  . Sexual Activity: Not on file   Other Topics Concern  . Not on file   Social History Narrative   Patients desires CPR.   Has HPOA.   The PMH, PSH, Social History, Family History, Medications, and allergies have been reviewed in [facility], and have been updated if relevant.  Review of Systems  Constitutional: Negative.   HENT: Negative.   Eyes: Negative.   Respiratory: Negative.   Cardiovascular: Negative.   Gastrointestinal: Negative.   Endocrine: Negative.   Genitourinary: Negative.   Musculoskeletal: Positive for arthralgias.  Allergic/Immunologic: Negative.   Neurological: Negative.   Hematological: Negative.   Psychiatric/Behavioral: Negative.        Objective:    BP 132/80 mmHg  Pulse 56  Temp(Src) 97.7 F (36.5 C) (Oral)  Ht 5' 9.5" (1.765 m)  Wt 197 lb (89.359 kg)  BMI 28.68 kg/m2  SpO2 97%  Wt Readings from Last 3 Encounters:  08/14/14 197 lb (89.359 kg)  10/17/13 203 lb 8 oz (92.307 kg)  11/19/12 197 lb (89.359 kg)    Physical Exam  Constitutional: He is oriented to person, place, and time. He appears well-developed and well-nourished. No distress.  HENT:  Head: Atraumatic.  Eyes: Conjunctivae are normal.  Neck: Normal range of motion. Neck supple.  Cardiovascular: Normal rate, regular rhythm and normal heart sounds.   Pulmonary/Chest: Effort normal and breath sounds normal.  Abdominal: Soft. Bowel sounds are normal. He exhibits no distension and no mass. There is no tenderness. There is no rebound and no guarding.  Musculoskeletal: Normal range of motion. He exhibits no edema.       Left hand: He exhibits normal range of motion and no tenderness. Normal strength noted. He exhibits no finger abduction and no  thumb/finger opposition.  Neurological: He is alert and oriented to person, place, and time. No cranial nerve deficit.  Skin: Skin is warm and dry.  Psychiatric: He has a normal mood and affect. His behavior is normal. Judgment and thought content normal.  Nursing note and vitals reviewed.         Assessment & Plan:   Medicare annual wellness visit, subsequent  Left hand pain  Essential hypertension  Testosterone deficiency  Tongue cancer  High serum thyroid stimulating hormone (TSH)  OSA on CPAP No Follow-up on file.

## 2014-08-14 NOTE — Assessment & Plan Note (Signed)
The patients weight, height, BMI and visual acuity have been recorded in the chart I have made referrals, counseling and provided education to the patient based review of the above and I have provided the pt with a written personalized care plan for preventive services.  He will send me records [facility] with his immunizations.

## 2014-08-14 NOTE — Assessment & Plan Note (Signed)
Well controlled. No changes made to rx today. 

## 2014-08-14 NOTE — Addendum Note (Signed)
Addended by: Lucille Passy on: 08/14/2014 10:33 AM   Modules accepted: Miquel Dunn

## 2014-08-14 NOTE — Assessment & Plan Note (Signed)
Followed by [facility], Dr. Romie Levee- will fax his lab results to his office today.

## 2014-08-14 NOTE — Patient Instructions (Addendum)
Please ask Austin Knapp about whether you have received Prevnar 13.  Think about Silver Sneaker or Tai Chi.  Please tell Austin Knapp that I said congratulations.

## 2014-08-14 NOTE — Assessment & Plan Note (Signed)
Hypothyroid but he feels clinically euthyroid. Discussed starting low dose thyroid replacement today but he continues to defer this and wants to await his ENT's recommendations.

## 2014-08-14 NOTE — Progress Notes (Signed)
Pre visit review using our clinic review tool, if applicable. No additional management support is needed unless otherwise documented below in the visit note. 

## 2014-08-26 NOTE — Telephone Encounter (Signed)
[employer] definitely pays for one [employer] and one physical per year.  If you want to call them to confirm, the number should be listed on the back of your card.  Thanks so much and have a great day!

## 2014-10-03 NOTE — Op Note (Signed)
PATIENT NAME:  Austin Knapp, Austin Knapp MR#:  [id number] DATE OF BIRTH:  1945-10-10  DATE OF PROCEDURE:  08/17/2012  PREOPERATIVE DIAGNOSIS: Right inguinal hernia.   POSTOPERATIVE DIAGNOSIS: Right inguinal hernia, indirect, with lipoma of the cord.   OPERATIVE PROCEDURE: Right inguinal hernia repair with medium UltraPro mesh and excision of lipoma of the cord.   SURGEON: Hervey Ard, MD.   ANESTHESIA: General by LMA under Dr. Boston Service, Marcaine 0.5% with 1:200,000 units of epinephrine, 30 mL local infiltration, Toradol 30 mg.   ESTIMATED BLOOD LOSS: Less than 5 mL.   CLINICAL NOTE: This 70 year old male has developed a symptomatic right inguinal hernia. He is admitted for elective repair.   OPERATIVE NOTE: With the patient under adequate general anesthesia and hair previously removed with clippers, the area was prepped with ChloraPrep and draped. A 5 cm incision along the anticipated course of the inguinal canal was carried down through the skin and subcutaneous tissue after the instillation of Marcaine for postoperative analgesia. The skin was incised sharply and the remaining dissection completed with electrocautery. The external oblique was opened in the direction of its fibers. The cord was mobilized. A large lipoma of the cord was excised and hemostasis achieved with 3-0 Vicryl tie. The indirect sac was mobilized free from the cord and returned to the preperitoneal space. There was no evidence of a direct defect. A medium UltraPro mesh was smoothed into position in the preperitoneal space and the external component laid along the floor of the inguinal canal. It was anchored in place with interrupted 0 Surgilon sutures. A lateral slit was made for cord passage. Toradol was placed into the wound. The external oblique was closed with a running 2-0 Vicryl. Scarpa's fascia was closed with a running 3-0 Vicryl and the skin closed with an running 4-0 Vicryl subcuticular suture. Benzoin and Steri-Strips  followed by Telfa and Tegaderm dressing was applied. The patient tolerated the procedure well and was taken to the recovery room in stable condition.    ____________________________ Robert Bellow, MD jwb:aw D: 08/17/2012 08:51:22 ET T: 08/17/2012 09:12:30 ET JOB#: [id number]  cc: Robert Bellow, MD, <Dictator> Lucky Rathke, MD JEFFREY Amedeo Kinsman MD ELECTRONICALLY SIGNED 08/18/2012 11:20

## 2014-10-05 NOTE — Consult Note (Signed)
Reason for Visit: This 69 year old Male patient presents to the clinic for initial evaluation of  Neck cancer of unknown origin .   Referred by Dr. Ladene Artist.  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 Remi Deter noticed a right neck mass. According to the patient probably have been present for several months. Initial fine-needle aspirate was negative although Dr. 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 I discussed the case personally with Dr. Ladene Artist he went back and did an upper endoscopy and noticed a lesion on the left tongue base. Did not biopsy that lesion. Patient is healed well from his surgery. He is otherwise in excellent medical condition. I been asked to evaluate the patient for further treatment strategy. He is having no head and neck pain no dysphasia no overt symptoms. He was a smoker although stop smoking approximately 30 years prior  Past Hx:    tinitis:    Sleep Apnea:    mastoidectomy: 1995  Past, Family and Social History:   Past Medical History positive    Respiratory Sleep apnea    Past Surgical History Mastoidectomy    Past Medical History Comments History of tinnitus, herniated disc L4-L5 in 2013, history of osteoporosis    Family History positive    Family History Comments Father with lung cancer squamous cell carcinoma, "bone cancer, sister with parotid gland cancer, mother with hypertension and adult onset  diabetes    Social History positive    Social History Comments Patient was a smoker proximally 20 years prior social EtOH use history    Additional Past Medical and Surgical History Accompanied by his wife today   Allergies:   Clindamycin: Rash  Home Meds:  Home Medications: Medication Instructions Status  AndroGel  1.62%, 2 pumps applied to each side of chest 2 times a day Active  hydrochlorothiazide 12.5 mg oral capsule 1 cap(s) orally once a day Active  Flonase 50 mcg/inh nasal spray 1 spray(s) nasal once a day Active  multivitamin 1 tab(s) orally once a day Active  Caltrate 600 mg oral tablet 2 tab(s) orally 3 times a day Active  Baby Asprin 81mg  1 tab(s) orally once a day Active   Review of Systems:   General negative    Performance Status (ECOG) 0    Skin negative    Breast negative    Ophthalmologic negative    ENMT see HPI    Respiratory and Thorax negative    Cardiovascular negative    Gastrointestinal negative    Genitourinary negative    Musculoskeletal negative    Neurological negative    Psychiatric negative    Hematology/Lymphatics negative    Endocrine negative    Allergic/Immunologic negative   Physical Exam:  General/Skin/HEENT:   General normal    Skin normal    Eyes normal    Additional PE Well-developed male in NAD. Oral cavity is clear. Teeth are in a good state of repair. No oral mucosal lesions are identified. He status post excisional biopsy  right neck which is bandaged although appears to be healing well. No other evidence of adenopathy is noted in the subdigastric cervical or supraclavicular region. Indirect mirror examination is difficult secondary to gag reflex. Base of tongue is not visualized well. Lungs are clear to A&P cardiac examination shows regular rate and rhythm.   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 Ladene Artist and Dr. Oliva Bustard. Do not believe debulking his ltongue base will offer any further treatment benefit. I have ordered a PET/CT scan to evaluate and totally stage his disease. After his PET/CT I have set him up for initial consultation with medical oncology and have asked to see him back for followup. If this is the base of tongue tumor with extension to metastatic disease in the neck nodes would offer concurrent chemotherapy and radiation therapy with curative intent. We will make a final determination data review of his PET/CT scan. Risks and benefits of radiation therapy and concurrent chemotherapy were discussed in detail with the patient and his wife. Side effects including possible xerostomia, sore throat and dysphasia loss of taste and skin reaction were all discussed in detail with the patient. Patients appointments for PET/CT, followup with me and initial consultation with Dr. Oliva Bustard were all scheduled.  I would like to take this opportunity to thank you for allowing me to continue to participate in this patient's care.  CC Referral:   cc: Dr. Ladene Artist, Dr. Deborra Medina   Electronic Signatures: Baruch Gouty, Roda Shutters (MD)  (Signed 26-Jun-13 16:29)  Authored: HPI, Diagnosis, Past Hx, PFSH, Allergies, Home Meds, ROS, Physical Exam, Encounter Assessment and Plan, CC Referring Physician   Last Updated: 26-Jun-13 16:29 by Armstead Peaks (MD)

## 2014-10-18 ENCOUNTER — Other Ambulatory Visit: Payer: Self-pay | Admitting: Family Medicine

## 2014-11-27 ENCOUNTER — Other Ambulatory Visit: Payer: Self-pay | Admitting: *Deleted

## 2014-11-27 NOTE — Telephone Encounter (Signed)
Last f/u appt 08/2014-CPE 

## 2015-01-16 ENCOUNTER — Encounter: Payer: Self-pay | Admitting: *Deleted

## 2015-01-19 ENCOUNTER — Ambulatory Visit: Payer: Medicare Other | Admitting: Anesthesiology

## 2015-01-19 ENCOUNTER — Ambulatory Visit
Admission: RE | Admit: 2015-01-19 | Discharge: 2015-01-19 | Disposition: A | Discharge status: Home / Self Care | Payer: Medicare Other | Source: Ambulatory Visit | Attending: Unknown Physician Specialty | Admitting: Unknown Physician Specialty

## 2015-01-19 ENCOUNTER — Encounter: Payer: Self-pay | Admitting: Anesthesiology

## 2015-01-19 ENCOUNTER — Encounter
Admission: RE | Disposition: A | Discharge status: Home / Self Care | Payer: Self-pay | Source: Ambulatory Visit | Attending: Unknown Physician Specialty

## 2015-01-19 DIAGNOSIS — Z87891 Personal history of nicotine dependence: Secondary | ICD-10-CM | POA: Insufficient documentation

## 2015-01-19 DIAGNOSIS — Z1211 Encounter for screening for malignant neoplasm of colon: Secondary | ICD-10-CM | POA: Insufficient documentation

## 2015-01-19 DIAGNOSIS — K5730 Diverticulosis of large intestine without perforation or abscess without bleeding: Secondary | ICD-10-CM | POA: Diagnosis not present

## 2015-01-19 DIAGNOSIS — I10 Essential (primary) hypertension: Secondary | ICD-10-CM | POA: Insufficient documentation

## 2015-01-19 DIAGNOSIS — Z8371 Family history of colonic polyps: Secondary | ICD-10-CM | POA: Diagnosis not present

## 2015-01-19 DIAGNOSIS — K648 Other hemorrhoids: Secondary | ICD-10-CM | POA: Insufficient documentation

## 2015-01-19 DIAGNOSIS — D123 Benign neoplasm of transverse colon: Secondary | ICD-10-CM | POA: Diagnosis not present

## 2015-01-19 HISTORY — PX: COLONOSCOPY WITH PROPOFOL: SHX5780

## 2015-01-19 HISTORY — DX: Sleep apnea, unspecified: G47.30

## 2015-01-19 SURGERY — COLONOSCOPY WITH PROPOFOL
Anesthesia: General

## 2015-01-19 MED ORDER — PROPOFOL 10 MG/ML IV BOLUS
INTRAVENOUS | Status: DC | PRN
  Administered 2015-01-19: 30 mg via INTRAVENOUS

## 2015-01-19 MED ORDER — SODIUM CHLORIDE 0.9 % IV SOLN: INTRAVENOUS | Status: DC

## 2015-01-19 MED ORDER — LIDOCAINE HCL (PF) 2 % IJ SOLN
INTRAMUSCULAR | Status: DC | PRN
  Administered 2015-01-19: 50 mg

## 2015-01-19 MED ORDER — FENTANYL CITRATE (PF) 100 MCG/2ML IJ SOLN
INTRAMUSCULAR | Status: DC | PRN
  Administered 2015-01-19: 50 ug via INTRAVENOUS

## 2015-01-19 MED ORDER — SODIUM CHLORIDE 0.9 % IV SOLN
INTRAVENOUS | Status: DC
  Administered 2015-01-19: 1000 mL via INTRAVENOUS

## 2015-01-19 MED ORDER — PROPOFOL INFUSION 10 MG/ML OPTIME
INTRAVENOUS | Status: DC | PRN
  Administered 2015-01-19: 100 ug/kg/min via INTRAVENOUS

## 2015-01-19 NOTE — Op Note (Signed)
[facility] Patient Name: Austin Knapp Procedure Date: 01/19/2015 1:57 PM MRN: [id number] Account #: [id number] Date of Birth: 05/28/1946 Admit Type: Outpatient Age: 69 Room: [facility] ENDO ROOM 1 Gender: Male Note Status: Finalized Procedure:         Colonoscopy Indications:       Colon cancer screening in patient at increased risk:                     Family history of colon polyps Providers:         Manya Silvas, MD Referring MD:      Marciano Sequin. Deborra Medina, MD (Referring MD) Medicines:         Propofol per Anesthesia Complications:     No immediate complications. Procedure:         Pre-Anesthesia Assessment:                    - After reviewing the risks and benefits, the patient was                     deemed in satisfactory condition to undergo the procedure.                    After obtaining informed consent, the colonoscope was                     passed under direct vision. Throughout the procedure, the                     patient's blood pressure, pulse, and oxygen saturations                     were monitored continuously. The Colonoscope was                     introduced through the anus and advanced to the the cecum,                     identified by appendiceal orifice and ileocecal valve. The                     colonoscopy was somewhat difficult due to multiple                     diverticula in the colon. The patient tolerated the                     procedure well. The quality of the bowel preparation was                     good. Findings:      A diminutive polyp was found in the rectum. The polyp was sessile. The       polyp was removed with a jumbo cold forceps. Resection and retrieval       were complete.      Multiple small and large-mouthed diverticula were found in the sigmoid       colon, in the descending colon, in the transverse colon and in the       ascending colon.      Internal hemorrhoids were found during endoscopy.  The hemorrhoids were       small-medium and Grade I (internal hemorrhoids that do not prolapse).      The exam was otherwise without abnormality. Impression:        -  One diminutive polyp in the rectum. Resected and                     retrieved.                    - Diverticulosis in the sigmoid colon, in the descending                     colon, in the transverse colon and in the ascending colon.                    - Internal hemorrhoids.                    - The examination was otherwise normal. Recommendation:    - Await pathology results. Manya Silvas, MD 01/19/2015 2:34:12 PM This report has been signed electronically. Number of Addenda: 0 Note Initiated On: 01/19/2015 1:57 PM Scope Withdrawal Time: 0 hours 13 minutes 7 seconds  Total Procedure Duration: 0 hours 20 minutes 7 seconds       [facility]

## 2015-01-19 NOTE — H&P (Signed)
Primary Care Physician:  Arnette Norris, MD Primary Gastroenterologist:  Dr. Vira Agar  Pre-Procedure History & Physical: HPI:  Austin Knapp is a 69 y.o. male is here for an colonoscopy.   Past Medical History  Diagnosis Date  . Hypertension   . Low testosterone   . Sleep apnea     Past Surgical History  Procedure Laterality Date  . Vasectomy    . Mastoidectomy    . Tongue excision      04/2012  . Hernia repair Right 08/2012    Prior to Admission medications   Medication Sig Start Date End Date Taking? Authorizing Provider  ANDROGEL PUMP 20.25 MG/ACT (1.62%) GEL APPLY 4 PUMPS ONCE DAILY AS DIRECTED. 11/18/13  Yes Lucille Passy, MD  aspirin 81 MG tablet Take 81 mg by mouth daily.   Yes Historical Provider, MD  calcium carbonate (OS-CAL) 600 MG TABS Take 600 mg by mouth 2 (two) times daily with a meal.   Yes Historical Provider, MD  DOXAZOSIN MESYLATE PO Take 1 tablet by mouth at bedtime.   Yes Historical Provider, MD  fluticasone (FLONASE) 50 MCG/ACT nasal spray instill 1 spray into each nostril once daily 04/25/14  Yes Lucille Passy, MD  hydrochlorothiazide (MICROZIDE) 12.5 MG capsule take 1 capsule by mouth once daily 10/20/14  Yes Lucille Passy, MD  LUTEIN PO Take 1 tablet by mouth daily.   Yes Historical Provider, MD  Multiple Vitamin (MULTIVITAMIN) tablet Take 1 tablet by mouth daily.   Yes Historical Provider, MD  Zinc 50 MG CAPS Take one by mouth daily   Yes Historical Provider, MD    Allergies as of 12/16/2014 - Review Complete 08/14/2014  Allergen Reaction Noted  . Clindamycin/lincomycin Rash 08/15/2011    Family History  Problem Relation Age of Onset  . Macular degeneration Mother   . Cancer Father     History   Social History  . Marital Status: Married    Spouse Name: N/A  . Number of Children: N/A  . Years of Education: N/A   Occupational History  . Not on file.   Social History Main Topics  . Smoking status: Former Research scientist (life sciences)  . Smokeless tobacco: Not on file   . Alcohol Use: Yes     Comment: rare  . Drug Use: No  . Sexual Activity: Not on file   Other Topics Concern  . Not on file   Social History Narrative   Patients desires CPR.   Has HPOA.    Review of Systems: See HPI, otherwise negative ROS  Physical Exam: BP 140/96 mmHg  Pulse 82  Temp(Src) 97.1 F (36.2 C) (Oral)  Resp 20  Ht 5\' 10"  (1.778 m)  Wt 86.183 kg (190 lb)  BMI 27.26 kg/m2  SpO2 100% General:   Alert,  pleasant and cooperative in NAD Head:  Normocephalic and atraumatic. Neck:  Supple; no masses or thyromegaly. Lungs:  Clear throughout to auscultation.    Heart:  Regular rate and rhythm. Abdomen:  Soft, nontender and nondistended. Normal bowel sounds, without guarding, and without rebound.   Neurologic:  Alert and  oriented x4;  grossly normal neurologically.  Impression/Plan: Austin Knapp is here for an colonoscopy to be performed for FH colon polyps  Risks, benefits, limitations, and alternatives regarding  colonoscopy have been reviewed with the patient.  Questions have been answered.  All parties agreeable.   Gaylyn Cheers, MD  01/19/2015, 2:00 PM   Primary Care Physician:  Arnette Norris, MD  Primary Gastroenterologist:  Dr. Vira Agar  Pre-Procedure History & Physical: HPI:  Austin Knapp is a 69 y.o. male is here for an colonoscopy.   Past Medical History  Diagnosis Date  . Hypertension   . Low testosterone   . Sleep apnea     Past Surgical History  Procedure Laterality Date  . Vasectomy    . Mastoidectomy    . Tongue excision      04/2012  . Hernia repair Right 08/2012    Prior to Admission medications   Medication Sig Start Date End Date Taking? Authorizing Provider  ANDROGEL PUMP 20.25 MG/ACT (1.62%) GEL APPLY 4 PUMPS ONCE DAILY AS DIRECTED. 11/18/13  Yes Lucille Passy, MD  aspirin 81 MG tablet Take 81 mg by mouth daily.   Yes Historical Provider, MD  calcium carbonate (OS-CAL) 600 MG TABS Take 600 mg by mouth 2 (two) times daily with a  meal.   Yes Historical Provider, MD  DOXAZOSIN MESYLATE PO Take 1 tablet by mouth at bedtime.   Yes Historical Provider, MD  fluticasone (FLONASE) 50 MCG/ACT nasal spray instill 1 spray into each nostril once daily 04/25/14  Yes Lucille Passy, MD  hydrochlorothiazide (MICROZIDE) 12.5 MG capsule take 1 capsule by mouth once daily 10/20/14  Yes Lucille Passy, MD  LUTEIN PO Take 1 tablet by mouth daily.   Yes Historical Provider, MD  Multiple Vitamin (MULTIVITAMIN) tablet Take 1 tablet by mouth daily.   Yes Historical Provider, MD  Zinc 50 MG CAPS Take one by mouth daily   Yes Historical Provider, MD    Allergies as of 12/16/2014 - Review Complete 08/14/2014  Allergen Reaction Noted  . Clindamycin/lincomycin Rash 08/15/2011    Family History  Problem Relation Age of Onset  . Macular degeneration Mother   . Cancer Father     History   Social History  . Marital Status: Married    Spouse Name: N/A  . Number of Children: N/A  . Years of Education: N/A   Occupational History  . Not on file.   Social History Main Topics  . Smoking status: Former Research scientist (life sciences)  . Smokeless tobacco: Not on file  . Alcohol Use: Yes     Comment: rare  . Drug Use: No  . Sexual Activity: Not on file   Other Topics Concern  . Not on file   Social History Narrative   Patients desires CPR.   Has HPOA.    Review of Systems: See HPI, otherwise negative ROS  Physical Exam: BP 140/96 mmHg  Pulse 82  Temp(Src) 97.1 F (36.2 C) (Oral)  Resp 20  Ht 5\' 10"  (1.778 m)  Wt 86.183 kg (190 lb)  BMI 27.26 kg/m2  SpO2 100% General:   Alert,  pleasant and cooperative in NAD Head:  Normocephalic and atraumatic. Neck:  Supple; no masses or thyromegaly. Lungs:  Clear throughout to auscultation.    Heart:  Regular rate and rhythm. Abdomen:  Soft, nontender and nondistended. Normal bowel sounds, without guarding, and without rebound.   Neurologic:  Alert and  oriented x4;  grossly normal  neurologically.  Impression/Plan: Austin Knapp is here for an colonoscopy to be performed for FH colon polyps  Risks, benefits, limitations, and alternatives regarding  colonoscopy have been reviewed with the patient.  Questions have been answered.  All parties agreeable.   Gaylyn Cheers, MD  01/19/2015, 2:00 PM

## 2015-01-19 NOTE — Transfer of Care (Signed)
Immediate Anesthesia Transfer of Care Note  Patient: Austin Knapp  Procedure(s) Performed: Procedure(s): COLONOSCOPY WITH PROPOFOL (N/A)  Patient Location: PACU  Anesthesia Type:General  Level of Consciousness: sedated  Airway & Oxygen Therapy: Patient Spontanous Breathing and Patient connected to nasal cannula oxygen  Post-op Assessment: Report given to RN and Post -op Vital signs reviewed and stable  Post vital signs: Reviewed and stable  Last Vitals:  Filed Vitals:   01/19/15 1313  BP: 140/96  Pulse: 82  Temp: 36.2 C  Resp: 20    Complications: No apparent anesthesia complications

## 2015-01-19 NOTE — Anesthesia Preprocedure Evaluation (Signed)
Anesthesia Evaluation  Patient identified by MRN, date of birth, ID band Patient awake    Reviewed: Allergy & Precautions, H&P , NPO status , Patient's Chart, lab work & pertinent test results, reviewed documented beta blocker date and time   History of Anesthesia Complications Negative for: history of anesthetic complications  Airway Mallampati: I  TM Distance: >3 FB Neck ROM: full    Dental no notable dental hx. (+) Missing   Pulmonary sleep apnea (home bipap) , neg COPDformer smoker,  breath sounds clear to auscultation  Pulmonary exam normal       Cardiovascular Exercise Tolerance: Good hypertension, - angina- CAD, - Past MI and - CABG Normal cardiovascular exam- dysrhythmias - Valvular Problems/MurmursRhythm:regular Rate:Normal     Neuro/Psych negative neurological ROS  negative psych ROS   GI/Hepatic negative GI ROS, Neg liver ROS,   Endo/Other  negative endocrine ROS  Renal/GU negative Renal ROS  negative genitourinary   Musculoskeletal   Abdominal   Peds  Hematology negative hematology ROS (+)   Anesthesia Other Findings Past Medical History:   Hypertension                                                 Low testosterone                                             Sleep apnea                                                  Reproductive/Obstetrics negative OB ROS                             Anesthesia Physical Anesthesia Plan  ASA: II  Anesthesia Plan: General   Post-op Pain Management:    Induction:   Airway Management Planned:   Additional Equipment:   Intra-op Plan:   Post-operative Plan:   Informed Consent: I have reviewed the patients History and Physical, chart, labs and discussed the procedure including the risks, benefits and alternatives for the proposed anesthesia with the patient or authorized representative who has indicated his/her understanding and  acceptance.   Dental Advisory Given  Plan Discussed with: Anesthesiologist, CRNA and Surgeon  Anesthesia Plan Comments:         Anesthesia Quick Evaluation

## 2015-01-20 ENCOUNTER — Encounter: Payer: Self-pay | Admitting: Unknown Physician Specialty

## 2015-01-21 LAB — SURGICAL PATHOLOGY

## 2015-01-21 NOTE — Anesthesia Postprocedure Evaluation (Signed)
  Anesthesia Post-op Note  Patient: Austin Knapp  Procedure(s) Performed: Procedure(s): COLONOSCOPY WITH PROPOFOL (N/A)  Anesthesia type:General  Patient location: PACU  Post pain: Pain level controlled  Post assessment: Post-op Vital signs reviewed, Patient's Cardiovascular Status Stable, Respiratory Function Stable, Patent Airway and No signs of Nausea or vomiting  Post vital signs: Reviewed and stable  Last Vitals:  Filed Vitals:   01/19/15 1510  BP: 123/96  Pulse: 59  Temp:   Resp: 9    Level of consciousness: awake, alert  and patient cooperative  Complications: No apparent anesthesia complications

## 2015-05-20 ENCOUNTER — Encounter: Payer: Self-pay | Admitting: Internal Medicine

## 2015-05-20 ENCOUNTER — Ambulatory Visit (INDEPENDENT_AMBULATORY_CARE_PROVIDER_SITE_OTHER): Payer: Medicare Other | Admitting: Internal Medicine

## 2015-05-20 VITALS — BP 122/86 | HR 83 | Temp 98.4°F | Wt 196.0 lb

## 2015-05-20 DIAGNOSIS — J069 Acute upper respiratory infection, unspecified: Secondary | ICD-10-CM | POA: Diagnosis not present

## 2015-05-20 MED ORDER — AZITHROMYCIN 250 MG PO TABS: ORAL_TABLET | ORAL | Status: DC

## 2015-05-20 NOTE — Progress Notes (Signed)
HPI  Pt presents to the clinic today with c/o headache, facial pressure, ear fullness, cough and chest congestion. This started 8 days ago. His symptoms seem worse on the right than the left. The cough is productive but he is not sure of the color. He denies shortness of breath. He denies fever, chills or body aches. He has tried Delsym, Tylenol and Nyquil with some relief. He has no history of allergies or breathing problems. He has not had sick contacts that he is aware of. He did get his flu shot.  Review of Systems      Past Medical History  Diagnosis Date  . Hypertension   . Low testosterone   . Sleep apnea     Family History  Problem Relation Age of Onset  . Macular degeneration Mother   . Cancer Father     Social History   Social History  . Marital Status: Married    Spouse Name: N/A  . Number of Children: N/A  . Years of Education: N/A   Occupational History  . Not on file.   Social History Main Topics  . Smoking status: Former Research scientist (life sciences)  . Smokeless tobacco: Not on file  . Alcohol Use: Yes     Comment: rare  . Drug Use: No  . Sexual Activity: Not on file   Other Topics Concern  . Not on file   Social History Narrative   Patients desires CPR.   Has HPOA.    Allergies  Allergen Reactions  . Clindamycin/Lincomycin Rash  . Latex      Constitutional: Positive headache, fatigue. Denies fever or abrupt weight changes.  HEENT:  Positive ear fullness. Denies eye redness, eye pain, pressure behind the eyes, facial pain, nasal congestion, ear pain, ringing in the ears, wax buildup, runny nose or sore throat. Respiratory: Positive cough. Denies difficulty breathing or shortness of breath.  Cardiovascular: Denies chest pain, chest tightness, palpitations or swelling in the hands or feet.   No other specific complaints in a complete review of systems (except as listed in HPI above).  Objective:   BP 122/86 mmHg  Pulse 83  Temp(Src) 98.4 F (36.9 C) (Oral)   Wt 196 lb (88.905 kg)  SpO2 98%  Wt Readings from Last 3 Encounters:  05/20/15 196 lb (88.905 kg)  01/19/15 190 lb (86.183 kg)  08/14/14 197 lb (89.359 kg)     General: Appears his stated age,  in NAD. HEENT: Head: normal shape and size, no sinus tenderness noted; Eyes: sclera white, no icterus, conjunctiva pink; Ears: Tm's gray and intact, normal light reflex; Nose: mucosa pink and moist, septum midline; Throat/Mouth: + PND. Teeth present, mucosa erythematous and moist, no exudate noted, no lesions or ulcerations noted.  Neck: No cervical lymphadenopathy.  Cardiovascular: Normal rate and rhythm. S1,S2 noted.  No murmur, rubs or gallops noted.  Pulmonary/Chest: Normal effort and positive vesicular breath sounds. No respiratory distress. No wheezes, rales or ronchi noted.      Assessment & Plan:   Upper Respiratory Infection:  Get some rest and drink plenty of water Do salt water gargles for the sore throat eRx for Azithromax x 5 days Continue Delsym/Nyquil for cough  RTC as needed or if symptoms persist.

## 2015-05-20 NOTE — Progress Notes (Signed)
Pre visit review using our clinic review tool, if applicable. No additional management support is needed unless otherwise documented below in the visit note. 

## 2015-05-20 NOTE — Patient Instructions (Signed)
Upper Respiratory Infection, Adult Most upper respiratory infections (URIs) are a viral infection of the air passages leading to the lungs. A URI affects the nose, throat, and upper air passages. The most common type of URI is nasopharyngitis and is typically referred to as "the common cold." URIs run their course and usually go away on their own. Most of the time, a URI does not require medical attention, but sometimes a bacterial infection in the upper airways can follow a viral infection. This is called a secondary infection. Sinus and middle ear infections are common types of secondary upper respiratory infections. Bacterial pneumonia can also complicate a URI. A URI can worsen asthma and chronic obstructive pulmonary disease (COPD). Sometimes, these complications can require emergency medical care and may be life threatening.  CAUSES Almost all URIs are caused by viruses. A virus is a type of germ and can spread from one person to another.  RISKS FACTORS You may be at risk for a URI if:   You smoke.   You have chronic heart or lung disease.  You have a weakened defense (immune) system.   You are very young or very old.   You have nasal allergies or asthma.  You work in crowded or poorly ventilated areas.  You work in health care facilities or schools. SIGNS AND SYMPTOMS  Symptoms typically develop 2-3 days after you come in contact with a cold virus. Most viral URIs last 7-10 days. However, viral URIs from the influenza virus (flu virus) can last 14-18 days and are typically more severe. Symptoms may include:   Runny or stuffy (congested) nose.   Sneezing.   Cough.   Sore throat.   Headache.   Fatigue.   Fever.   Loss of appetite.   Pain in your forehead, behind your eyes, and over your cheekbones (sinus pain).  Muscle aches.  DIAGNOSIS  Your health care provider may diagnose a URI by:  Physical exam.  Tests to check that your symptoms are not due to  another condition such as:  Strep throat.  Sinusitis.  Pneumonia.  Asthma. TREATMENT  A URI goes away on its own with time. It cannot be cured with medicines, but medicines may be prescribed or recommended to relieve symptoms. Medicines may help:  Reduce your fever.  Reduce your cough.  Relieve nasal congestion. HOME CARE INSTRUCTIONS   Take medicines only as directed by your health care provider.   Gargle warm saltwater or take cough drops to comfort your throat as directed by your health care provider.  Use a warm mist humidifier or inhale steam from a shower to increase air moisture. This may make it easier to breathe.  Drink enough fluid to keep your urine clear or pale yellow.   Eat soups and other clear broths and maintain good nutrition.   Rest as needed.   Return to work when your temperature has returned to normal or as your health care provider advises. You may need to stay home longer to avoid infecting others. You can also use a face mask and careful hand washing to prevent spread of the virus.  Increase the usage of your inhaler if you have asthma.   Do not use any tobacco products, including cigarettes, chewing tobacco, or electronic cigarettes. If you need help quitting, ask your health care provider. PREVENTION  The best way to protect yourself from getting a cold is to practice good hygiene.   Avoid oral or hand contact with people with cold   symptoms.   Wash your hands often if contact occurs.  There is no clear evidence that vitamin C, vitamin E, echinacea, or exercise reduces the chance of developing a cold. However, it is always recommended to get plenty of rest, exercise, and practice good nutrition.  SEEK MEDICAL CARE IF:   You are getting worse rather than better.   Your symptoms are not controlled by medicine.   You have chills.  You have worsening shortness of breath.  You have brown or red mucus.  You have yellow or brown nasal  discharge.  You have pain in your face, especially when you bend forward.  You have a fever.  You have swollen neck glands.  You have pain while swallowing.  You have white areas in the back of your throat. SEEK IMMEDIATE MEDICAL CARE IF:   You have severe or persistent:  Headache.  Ear pain.  Sinus pain.  Chest pain.  You have chronic lung disease and any of the following:  Wheezing.  Prolonged cough.  Coughing up blood.  A change in your usual mucus.  You have a stiff neck.  You have changes in your:  Vision.  Hearing.  Thinking.  Mood. MAKE SURE YOU:   Understand these instructions.  Will watch your condition.  Will get help right away if you are not doing well or get worse.   This information is not intended to replace advice given to you by your health care provider. Make sure you discuss any questions you have with your health care provider.   Document Released: 11/23/2000 Document Revised: 10/14/2014 Document Reviewed: 09/04/2013 Elsevier Interactive Patient Education 2016 [employer]  

## 2015-08-19 ENCOUNTER — Ambulatory Visit (INDEPENDENT_AMBULATORY_CARE_PROVIDER_SITE_OTHER): Payer: Medicare Other | Admitting: Internal Medicine

## 2015-08-19 ENCOUNTER — Encounter: Payer: Self-pay | Admitting: Internal Medicine

## 2015-08-19 VITALS — BP 120/76 | HR 68 | Temp 98.7°F | Wt 196.0 lb

## 2015-08-19 DIAGNOSIS — J309 Allergic rhinitis, unspecified: Secondary | ICD-10-CM

## 2015-08-19 MED ORDER — HYDROCODONE-HOMATROPINE 5-1.5 MG/5ML PO SYRP: 5.0000 mL | ORAL_SOLUTION | Freq: Three times a day (TID) | ORAL | Status: DC | PRN

## 2015-08-19 MED ORDER — METHYLPREDNISOLONE ACETATE 80 MG/ML IJ SUSP
80.0000 mg | Freq: Once | INTRAMUSCULAR | Status: AC
  Administered 2015-08-19: 80 mg via INTRAMUSCULAR

## 2015-08-19 NOTE — Progress Notes (Signed)
Pre visit review using our clinic review tool, if applicable. No additional management support is needed unless otherwise documented below in the visit note. 

## 2015-08-19 NOTE — Addendum Note (Signed)
Addended by: Lurlean Nanny on: 08/19/2015 04:36 PM   Modules accepted: Orders

## 2015-08-19 NOTE — Progress Notes (Signed)
HPI  Pt presents to the clinic today with c/o headache, facial pain and pressure, nasal congestion and cough. This started 1 week ago. He is blowing blood tinged clear mucous out of his nose. The cough is non productive. He feels slightly short of breath. He denies fever, chills or body aches. He has tried Tylenol, Sudafed, Flonase and Benadryl with minimal relief. He has had sick contacts. He did get his flu shot.  Review of Systems    Past Medical History  Diagnosis Date  . Hypertension   . Low testosterone   . Sleep apnea     Family History  Problem Relation Age of Onset  . Macular degeneration Mother   . Cancer Father     Social History   Social History  . Marital Status: Married    Spouse Name: N/A  . Number of Children: N/A  . Years of Education: N/A   Occupational History  . Not on file.   Social History Main Topics  . Smoking status: Former Research scientist (life sciences)  . Smokeless tobacco: Not on file  . Alcohol Use: Yes     Comment: rare  . Drug Use: No  . Sexual Activity: Not on file   Other Topics Concern  . Not on file   Social History Narrative   Patients desires CPR.   Has HPOA.    Allergies  Allergen Reactions  . Clindamycin/Lincomycin Rash  . Latex      Constitutional: Positive headache. Denies fatigue, fever or abrupt weight changes.  HEENT:  Positive facial pain, nasal congestion. Denies eye redness, ear pain, ringing in the ears, wax buildup, runny nose or sore throat. Respiratory: Positive cough and shortness of breath. Denies difficulty breathing.  Cardiovascular: Denies chest pain, chest tightness, palpitations or swelling in the hands or feet.   No other specific complaints in a complete review of systems (except as listed in HPI above).  Objective:   BP 120/76 mmHg  Pulse 68  Temp(Src) 98.7 F (37.1 C) (Oral)  Wt 196 lb (88.905 kg)  SpO2 98%  General: Appears his stated age, in NAD. HEENT: Head: normal shape and size, frontal sinus tenderness  noted; Eyes: sclera white, no icterus, conjunctiva pink; Ears: Tm's gray and intact, normal light reflex; Nose: mucosa dry and moist, septum midline; Throat/Mouth: + PND. Teeth present, mucosa erythematous and moist, no exudate noted, no lesions or ulcerations noted.  Neck:  No adenopathy noted.  Cardiovascular: Normal rate and rhythm. S1,S2 noted.  No murmur, rubs or gallops noted.  Pulmonary/Chest: Normal effort and positive vesicular breath sounds. No respiratory distress. No wheezes, rales or ronchi noted.      Assessment & Plan:   Allergic Rhinitis  Can use a Neti Pot which can be purchased from your local drug store. Flonase 2 sprays each nostril for 3 days and then as needed. Start Zyrtec daily x 2 weeks 80 mg Depo IM today If persists, can call in RX for Amoxil 500 mg TID prn  RTC as needed or if symptoms persist.

## 2015-08-19 NOTE — Patient Instructions (Signed)
Allergic Rhinitis Allergic rhinitis is when the mucous membranes in the nose respond to allergens. Allergens are particles in the air that cause your body to have an allergic reaction. This causes you to release allergic antibodies. Through a chain of events, these eventually cause you to release histamine into the blood stream. Although meant to protect the body, it is this release of histamine that causes your discomfort, such as frequent sneezing, congestion, and an itchy, runny nose.  CAUSES Seasonal allergic rhinitis (hay fever) is caused by pollen allergens that may come from grasses, trees, and weeds. Year-round allergic rhinitis (perennial allergic rhinitis) is caused by allergens such as house dust mites, pet dander, and mold spores. SYMPTOMS  Nasal stuffiness (congestion).  Itchy, runny nose with sneezing and tearing of the eyes. DIAGNOSIS Your health care provider can help you determine the allergen or allergens that trigger your symptoms. If you and your health care provider are unable to determine the allergen, skin or blood testing may be used. Your health care provider will diagnose your condition after taking your health history and performing a physical exam. Your health care provider may assess you for other related conditions, such as asthma, pink eye, or an ear infection. TREATMENT Allergic rhinitis does not have a cure, but it can be controlled by:  Medicines that block allergy symptoms. These may include allergy shots, nasal sprays, and oral antihistamines.  Avoiding the allergen. Hay fever may often be treated with antihistamines in pill or nasal spray forms. Antihistamines block the effects of histamine. There are over-the-counter medicines that may help with nasal congestion and swelling around the eyes. Check with your health care provider before taking or giving this medicine. If avoiding the allergen or the medicine prescribed do not work, there are many new medicines  your health care provider can prescribe. Stronger medicine may be used if initial measures are ineffective. Desensitizing injections can be used if medicine and avoidance does not work. Desensitization is when a patient is given ongoing shots until the body becomes less sensitive to the allergen. Make sure you follow up with your health care provider if problems continue. HOME CARE INSTRUCTIONS It is not possible to completely avoid allergens, but you can reduce your symptoms by taking steps to limit your exposure to them. It helps to know exactly what you are allergic to so that you can avoid your specific triggers. SEEK MEDICAL CARE IF:  You have a fever.  You develop a cough that does not stop easily (persistent).  You have shortness of breath.  You start wheezing.  Symptoms interfere with normal daily activities.   This information is not intended to replace advice given to you by your health care provider. Make sure you discuss any questions you have with your health care provider.   Document Released: 02/22/2001 Document Revised: 06/20/2014 Document Reviewed: 02/04/2013 Elsevier Interactive Patient Education 2016 Elsevier Inc.  

## 2015-08-21 ENCOUNTER — Other Ambulatory Visit: Payer: Self-pay | Admitting: Internal Medicine

## 2015-08-21 ENCOUNTER — Telehealth: Payer: Self-pay | Admitting: Family Medicine

## 2015-08-21 MED ORDER — AMOXICILLIN 500 MG PO CAPS: 500.0000 mg | ORAL_CAPSULE | Freq: Three times a day (TID) | ORAL | Status: DC

## 2015-08-21 NOTE — Telephone Encounter (Signed)
Pt is aware as instructed 

## 2015-08-21 NOTE — Telephone Encounter (Signed)
Pt called he saw regina on 3/8 he wanted to let you know that he is not doing any better He wanted to know what he needs to do next Please advise  [facility] [address] [place]

## 2015-08-21 NOTE — Telephone Encounter (Signed)
I will send ABX to his pharmacy

## 2015-09-09 ENCOUNTER — Encounter: Payer: Self-pay | Admitting: Family Medicine

## 2015-09-19 ENCOUNTER — Telehealth: Payer: Self-pay | Admitting: Family Medicine

## 2015-09-19 NOTE — Telephone Encounter (Signed)
LM for pt to sch AWV, mn ° °

## 2015-09-21 NOTE — Telephone Encounter (Signed)
Patient returned Maegen's call.  Patient scheduled appointment with Katha Cabal on 10/06/15 and Dr.Aron on 10/13/15.  I mailed medicare ppw to patient.

## 2015-10-06 ENCOUNTER — Ambulatory Visit (INDEPENDENT_AMBULATORY_CARE_PROVIDER_SITE_OTHER): Payer: Medicare Other

## 2015-10-06 VITALS — BP 110/74 | HR 61 | Temp 97.9°F | Ht 69.25 in | Wt 195.2 lb

## 2015-10-06 DIAGNOSIS — Z125 Encounter for screening for malignant neoplasm of prostate: Secondary | ICD-10-CM

## 2015-10-06 DIAGNOSIS — E7800 Pure hypercholesterolemia, unspecified: Secondary | ICD-10-CM | POA: Diagnosis not present

## 2015-10-06 DIAGNOSIS — Z1159 Encounter for screening for other viral diseases: Secondary | ICD-10-CM

## 2015-10-06 DIAGNOSIS — Z0000 Encounter for general adult medical examination without abnormal findings: Secondary | ICD-10-CM | POA: Diagnosis not present

## 2015-10-06 DIAGNOSIS — I10 Essential (primary) hypertension: Secondary | ICD-10-CM | POA: Diagnosis not present

## 2015-10-06 LAB — COMPREHENSIVE METABOLIC PANEL
ALBUMIN: 4.3 g/dL (ref 3.5–5.2)
ALK PHOS: 46 U/L (ref 39–117)
ALT: 20 U/L (ref 0–53)
AST: 20 U/L (ref 0–37)
BUN: 15 mg/dL (ref 6–23)
CALCIUM: 10.1 mg/dL (ref 8.4–10.5)
CO2: 32 mEq/L (ref 19–32)
Chloride: 101 mEq/L (ref 96–112)
Creatinine, Ser: 0.88 mg/dL (ref 0.40–1.50)
GFR: 91.28 mL/min (ref >60.00)
Glucose, Bld: 91 mg/dL (ref 70–99)
Potassium: 5 mEq/L (ref 3.5–5.1)
Sodium: 139 mEq/L (ref 135–145)
Total Bilirubin: 1 mg/dL (ref 0.2–1.2)
Total Protein: 6.6 g/dL (ref 6.0–8.3)

## 2015-10-06 LAB — LIPID PANEL
Cholesterol: 182 mg/dL (ref 0–200)
HDL: 39.5 mg/dL (ref >39.00)
LDL Cholesterol: 117 mg/dL — ABNORMAL HIGH (ref 0–99)
NonHDL: 142.48
TRIGLYCERIDES: 125 mg/dL (ref 0.0–149.0)
Total CHOL/HDL Ratio: 5
VLDL: 25 mg/dL (ref 0.0–40.0)

## 2015-10-06 LAB — PSA, MEDICARE: PSA: 0.69 ng/mL (ref 0.10–4.00)

## 2015-10-06 LAB — TSH: TSH: 6.6 u[IU]/mL — ABNORMAL HIGH (ref 0.35–4.50)

## 2015-10-06 NOTE — Progress Notes (Signed)
Subjective:   Austin Knapp is a 70 y.o. male who presents for Medicare Annual/Subsequent preventive examination.  Cardiac Risk Factors include: advanced age (>76men, >23 women);hypertension;male gender     Objective:    Vitals: BP 110/74 mmHg  Pulse 61  Temp(Src) 97.9 F (36.6 C) (Oral)  Ht 5' 9.25" (1.759 m)  Wt 195 lb 4 oz (88.565 kg)  BMI 28.62 kg/m2  SpO2 96%  Body mass index is 28.62 kg/(m^2).  Tobacco History  Smoking status  . Former Smoker  Smokeless tobacco  . Not on file     Counseling given: No   Past Medical History  Diagnosis Date  . Hypertension   . Low testosterone   . Sleep apnea    Past Surgical History  Procedure Laterality Date  . Vasectomy    . Mastoidectomy    . Tongue excision      04/2012  . Hernia repair Right 08/2012  . Colonoscopy with propofol N/A 01/19/2015    Procedure: COLONOSCOPY WITH PROPOFOL;  Surgeon: Manya Silvas, MD;  Location: [facility] ENDOSCOPY;  Service: Endoscopy;  Laterality: N/A;   Family History  Problem Relation Age of Onset  . Macular degeneration Mother   . Cancer Father    History  Sexual Activity  . Sexual Activity: Yes    Outpatient Encounter Prescriptions as of 10/06/2015  Medication Sig  . ANDROGEL PUMP 20.25 MG/ACT (1.62%) GEL APPLY 4 PUMPS ONCE DAILY AS DIRECTED.  Marland Kitchen aspirin 81 MG tablet Take 81 mg by mouth daily.  . calcium carbonate (OS-CAL) 600 MG TABS Take 600 mg by mouth 2 (two) times daily with a meal.  . Cholecalciferol (VITAMIN D3) 2000 units TABS Take 1 capsule by mouth.  Marland Kitchen DOXAZOSIN MESYLATE PO Take 1 tablet by mouth at bedtime.   . fluticasone (FLONASE) 50 MCG/ACT nasal spray instill 1 spray into each nostril once daily  . hydrochlorothiazide (MICROZIDE) 12.5 MG capsule take 1 capsule by mouth once daily  . LUTEIN PO Take 1 tablet by mouth daily.  . Multiple Vitamin (MULTIVITAMIN) tablet Take 1 tablet by mouth daily.  . Zinc 50 MG CAPS Take one by mouth daily  . [DISCONTINUED] amoxicillin  (AMOXIL) 500 MG capsule Take 1 capsule (500 mg total) by mouth 3 (three) times daily.  . [DISCONTINUED] HYDROcodone-homatropine (HYCODAN) 5-1.5 MG/5ML syrup Take 5 mLs by mouth every 8 (eight) hours as needed for cough.   No facility-administered encounter medications on file as of 10/06/2015.    Activities of Daily Living In your present state of health, do you have any difficulty performing the following activities: 10/06/2015  Hearing? Y  Vision? N  Difficulty concentrating or making decisions? N  Walking or climbing stairs? N  Dressing or bathing? N  Doing errands, shopping? N  Preparing Food and eating ? N  Using the Toilet? N  In the past six months, have you accidently leaked urine? N  Do you have problems with loss of bowel control? N  Managing your Medications? N  Managing your Finances? N  Housekeeping or managing your Housekeeping? N    Patient Care Team: Lucille Passy, MD as PCP - General (Family Medicine) Loletta Parish, MD as Referring Physician (Hematology and Oncology) Merry Proud, PA-C as Consulting Physician (Physician Assistant) Ronnell Freshwater, MD as Referring Physician (Ophthalmology)   Assessment:     Hearing Screening   125Hz  250Hz  500Hz  1000Hz  2000Hz  4000Hz  8000Hz   Right ear:   40 40 40 0  Left ear:   0 0 40 40   Vision Screening Comments: Last eye exam in March 2017   Exercise Activities and Dietary recommendations Current Exercise Habits: The patient does not participate in regular exercise at present, Exercise limited by: None identified  Goals    . Weight < 181 lb (82.101 kg)     Target weight loss is 15 lbs. Starting 10/06/2015, I will limit intake of simple carbohydrates to 1 serving daily.       Fall Risk Fall Risk  10/06/2015 08/14/2014 08/14/2014 11/19/2012  Falls in the past year? No Yes Yes No  Number falls in past yr: - 1 1 -  Injury with Fall? - Yes Yes -   Depression Screen PHQ 2/9 Scores 10/06/2015 08/14/2014 11/19/2012  PHQ -  2 Score 0 0 0    Cognitive Testing MMSE - Mini Mental State Exam 10/06/2015  Orientation to time 5  Orientation to Place 5  Registration 3  Attention/ Calculation 0  Recall 3  Language- name 2 objects 0  Language- repeat 1  Language- follow 3 step command 3  Language- read & follow direction 0  Write a sentence 0  Copy design 0  Total score 20   PLEASE NOTE: A Mini-Cog screen was completed. Maximum score is 20. A value of 0 denotes this part of Folstein MMSE was not completed.  Orientation to Time - Max 5 Orientation to Place - Max 5 Registration - Max 3 Recall - Max 3 Language Repeat - Max 1 Language Follow 3 Step Command - Max 3  Immunization History  Administered Date(s) Administered  . Influenza-Unspecified 03/13/2014   Screening Tests Health Maintenance  Topic Date Due  . TETANUS/TDAP  10/05/2016 (Originally 10/31/1964)  . PNA vac Low Risk Adult (2 of 2 - PPSV23) 10/05/2016 (Originally 12/18/2014)  . INFLUENZA VACCINE  01/12/2016  . COLONOSCOPY  01/18/2025  . ZOSTAVAX  Addressed  . Hepatitis C Screening  Completed      Plan:     I have personally reviewed and addressed the Medicare Annual Wellness questionnaire and have noted the following in the patient's chart:  A. Medical and social history B. Use of alcohol, tobacco or illicit drugs  C. Current medications and supplements D. Functional ability and status E.  Nutritional status F.  Physical activity G. Advance directives H. List of other physicians I.  Hospitalizations, surgeries, and ER visits in previous 12 months J.  [place] to include hearing, vision, cognitive, depression L. Referrals and appointments - none  In addition, I have reviewed and discussed with patient certain preventive protocols, quality metrics, and best practice recommendations. A written personalized care plan for preventive services as well as general preventive health recommendations were provided to patient.  See  attached scanned questionnaire for additional information.   Signed,   Lindell Noe, MHA, BS, LPN Health Advisor [id number]

## 2015-10-06 NOTE — Patient Instructions (Signed)
Mr. Westerman , Thank you for taking time to come for your Medicare Wellness Visit. I appreciate your ongoing commitment to your health goals. Please review the following plan we discussed and let me know if I can assist you in the future.   These are the goals we discussed: Goals    . Weight < 181 lb (82.101 kg)     Target weight loss is 15 lbs. Starting 10/06/2015, I will limit intake of simple carbohydrates to 1 serving daily.        This is a list of the screening recommended for you and due dates:  Health Maintenance  Topic Date Due  . Tetanus Vaccine  10/05/2016*  . Pneumonia vaccines (2 of 2 - PPSV23) 10/05/2016*  . Flu Shot  01/12/2016  . Colon Cancer Screening  01/18/2025  . Shingles Vaccine  Addressed  .  Hepatitis C: One time screening is recommended by Center for Disease Control  (CDC) for  adults born from [birth year] through 1964.   Completed  *Topic was postponed. The date shown is not the original due date.   Preventive Care for Adults  A healthy lifestyle and preventive care can promote health and wellness. Preventive health guidelines for adults include the following key practices.  . A routine yearly physical is a good way to check with your health care provider about your health and preventive screening. It is a chance to share any concerns and updates on your health and to receive a thorough exam.  . Visit your dentist for a routine exam and preventive care every 6 months. Brush your teeth twice a day and floss once a day. Good oral hygiene prevents tooth decay and gum disease.  . The frequency of eye exams is based on your age, health, family medical history, use  of contact lenses, and other factors. Follow your health care provider's ecommendations for frequency of eye exams.  . Eat a healthy diet. Foods like vegetables, fruits, whole grains, low-fat dairy products, and lean protein foods contain the nutrients you need without too many calories. Decrease your intake of  foods high in solid fats, added sugars, and salt. Eat the right amount of calories for you. Get information about a proper diet from your health care provider, if necessary.  . Regular physical exercise is one of the most important things you can do for your health. Most adults should get at least 150 minutes of moderate-intensity exercise (any activity that increases your heart rate and causes you to sweat) each week. In addition, most adults need muscle-strengthening exercises on 2 or more days a week.  Silver Sneakers may be a benefit available to you. To determine eligibility, you may visit the website: www.silversneakers.com or contact program at (908) 738-0772 Mon-Fri between 8AM-8PM.   . Maintain a healthy weight. The body mass index (BMI) is a screening tool to identify possible weight problems. It provides an estimate of body fat based on height and weight. Your health care provider can find your BMI and can help you achieve or maintain a healthy weight.   For adults 20 years and older: ? A BMI below 18.5 is considered underweight. ? A BMI of 18.5 to 24.9 is normal. ? A BMI of 25 to 29.9 is considered overweight. ? A BMI of 30 and above is considered obese.   . Maintain normal blood lipids and cholesterol levels by exercising and minimizing your intake of saturated fat. Eat a balanced diet with plenty of fruit and vegetables.  Blood tests for lipids and cholesterol should begin at age 55 and be repeated every 5 years. If your lipid or cholesterol levels are high, you are over 50, or you are at high risk for heart disease, you may need your cholesterol levels checked more frequently. Ongoing high lipid and cholesterol levels should be treated with medicines if diet and exercise are not working.  . If you smoke, find out from your health care provider how to quit. If you do not use tobacco, please do not start.  . If you choose to drink alcohol, please do not consume more than 2 drinks per  day. One drink is considered to be 12 ounces (355 mL) of beer, 5 ounces (148 mL) of wine, or 1.5 ounces (44 mL) of liquor.  . If you are 75-97 years old, ask your health care provider if you should take aspirin to prevent strokes.  . Use sunscreen. Apply sunscreen liberally and repeatedly throughout the day. You should seek shade when your shadow is shorter than you. Protect yourself by wearing long sleeves, pants, a wide-brimmed hat, and sunglasses year round, whenever you are outdoors.  . Once a month, do a whole body skin exam, using a mirror to look at the skin on your back. Tell your health care provider of new moles, moles that have irregular borders, moles that are larger than a pencil eraser, or moles that have changed in shape or color.

## 2015-10-06 NOTE — Progress Notes (Signed)
I reviewed health advisor's note, was available for consultation, and agree with documentation and plan.  

## 2015-10-06 NOTE — Progress Notes (Signed)
Pre visit review using our clinic review tool, if applicable. No additional management support is needed unless otherwise documented below in the visit note. 

## 2015-10-07 LAB — HEPATITIS C ANTIBODY: HCV AB: NEGATIVE

## 2015-10-13 ENCOUNTER — Ambulatory Visit (INDEPENDENT_AMBULATORY_CARE_PROVIDER_SITE_OTHER): Payer: Medicare Other | Admitting: Family Medicine

## 2015-10-13 ENCOUNTER — Encounter: Payer: Self-pay | Admitting: Family Medicine

## 2015-10-13 VITALS — BP 126/72 | HR 50 | Temp 97.5°F | Ht 69.5 in | Wt 193.8 lb

## 2015-10-13 DIAGNOSIS — R7989 Other specified abnormal findings of blood chemistry: Secondary | ICD-10-CM

## 2015-10-13 DIAGNOSIS — E349 Endocrine disorder, unspecified: Secondary | ICD-10-CM

## 2015-10-13 DIAGNOSIS — Z0000 Encounter for general adult medical examination without abnormal findings: Secondary | ICD-10-CM | POA: Diagnosis not present

## 2015-10-13 DIAGNOSIS — E291 Testicular hypofunction: Secondary | ICD-10-CM | POA: Diagnosis not present

## 2015-10-13 DIAGNOSIS — C029 Malignant neoplasm of tongue, unspecified: Secondary | ICD-10-CM

## 2015-10-13 DIAGNOSIS — I10 Essential (primary) hypertension: Secondary | ICD-10-CM

## 2015-10-13 DIAGNOSIS — Z9989 Dependence on other enabling machines and devices: Secondary | ICD-10-CM

## 2015-10-13 DIAGNOSIS — G4733 Obstructive sleep apnea (adult) (pediatric): Secondary | ICD-10-CM

## 2015-10-13 DIAGNOSIS — Z136 Encounter for screening for cardiovascular disorders: Secondary | ICD-10-CM

## 2015-10-13 NOTE — Progress Notes (Signed)
Subjective:   Patient ID: Austin Knapp, male    DOB: [birth year], 70 y.o.   MRN: [id number]  Austin Knapp is a pleasant 70 y.o. year old male who presents to clinic today with Annual Exam  on 10/13/2015  HPI:  Annual medicare wellness visit last week by Candis Musa, RN. Notes reviewed.  High TSH- Lab Results  Component Value Date   TSH 6.60* 10/06/2015    Has been hypothyroid on lab work for last several visits.  Oncologist aware due to his h/o radiation.  Austin Knapp is not having any symptoms and prefers to just watch this and fax results to his ENT.  HTN- has been taking HCTZ 12.5 mg daily for years without issue. Denies HA, blurred vision, CP or SOB.  Lab Results  Component Value Date   CREATININE 0.88 10/06/2015   Lab Results  Component Value Date   CHOL 182 10/06/2015   HDL 39.50 10/06/2015   LDLCALC 117* 10/06/2015   TRIG 125.0 10/06/2015   CHOLHDL 5 10/06/2015    Low T- has been taking Androgel for years. No personal or family history prostate CA. 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 Last appointment was on 04/20/15- Dr. Romie Levee.  Note reviewed.  CPAP- on OSA, followed by [place]. Current Outpatient Prescriptions on File Prior to Visit  Medication Sig Dispense Refill  . ANDROGEL PUMP 20.25 MG/ACT (1.62%) GEL APPLY 4 PUMPS ONCE DAILY AS DIRECTED. 450 g 0  . aspirin 81 MG tablet Take 81 mg by mouth daily.    . calcium carbonate (OS-CAL) 600 MG TABS Take 600 mg by mouth 2 (two) times daily with a meal.    . Cholecalciferol (VITAMIN D3) 2000 units TABS Take 1 capsule by mouth.    Marland Kitchen DOXAZOSIN MESYLATE PO Take 1 tablet by mouth at  bedtime.     . fluticasone (FLONASE) 50 MCG/ACT nasal spray instill 1 spray into each nostril once daily 16 g 1  . hydrochlorothiazide (MICROZIDE) 12.5 MG capsule take 1 capsule by mouth once daily 30 capsule 10  . LUTEIN PO Take 1 tablet by mouth daily.    . Multiple Vitamin (MULTIVITAMIN) tablet Take 1 tablet by mouth daily.    . Zinc 50 MG CAPS Take one by mouth daily    . [DISCONTINUED] lisinopril-hydrochlorothiazide (PRINZIDE,ZESTORETIC) 10-12.5 MG per tablet Take 1 tablet by mouth daily.     No current facility-administered medications on file prior to visit.    Allergies  Allergen Reactions  . Clindamycin/Lincomycin Rash  . Latex     Past Medical History  Diagnosis Date  . Hypertension   . Low testosterone   . Sleep apnea     Past Surgical History  Procedure Laterality Date  . Vasectomy    . Mastoidectomy    . Tongue excision      04/2012  . Hernia repair Right 08/2012  . Colonoscopy with propofol N/A 01/19/2015    Procedure: COLONOSCOPY WITH PROPOFOL;  Surgeon: Manya Silvas, MD;  Location: [facility] ENDOSCOPY;  Service: Endoscopy;  Laterality: N/A;  Family History  Problem Relation Age of Onset  . Macular degeneration Mother   . Cancer Father     Social History   Social History  . Marital Status: Married    Spouse Name: N/A  . Number of Children: N/A  . Years of Education: N/A   Occupational History  . Not on file.   Social History Main Topics  . Smoking status: Former Research scientist (life sciences)  . Smokeless tobacco: Not on file  . Alcohol Use: Yes     Comment: rare  . Drug Use: No  . Sexual Activity: Yes   Other Topics Concern  . Not on file   Social History Narrative   Patients desires CPR.   Has HPOA.   The PMH, PSH, Social History, Family History, Medications, and allergies have been reviewed in [facility], and have been updated if relevant.  Review of Systems  Constitutional: Negative.   HENT: Negative.   Eyes: Negative.   Respiratory: Negative.     Cardiovascular: Negative.   Gastrointestinal: Negative.   Endocrine: Negative.   Genitourinary: Negative.   Musculoskeletal: Negative for arthralgias.  Allergic/Immunologic: Negative.   Neurological: Negative.   Hematological: Negative.   Psychiatric/Behavioral: Negative.        Objective:    BP 126/72 mmHg  Pulse 50  Temp(Src) 97.5 F (36.4 C) (Oral)  Ht 5' 9.5" (1.765 m)  Wt 193 lb 12 oz (87.884 kg)  BMI 28.21 kg/m2  SpO2 98%  Wt Readings from Last 3 Encounters:  10/13/15 193 lb 12 oz (87.884 kg)  10/06/15 195 lb 4 oz (88.565 kg)  08/19/15 196 lb (88.905 kg)    Physical Exam  Constitutional: He is oriented to person, place, and time. He appears well-developed and well-nourished. No distress.  HENT:  Head: Atraumatic.  Eyes: Conjunctivae are normal.  Neck: Normal range of motion. Neck supple.  Cardiovascular: Normal rate, regular rhythm and normal heart sounds.   Pulmonary/Chest: Effort normal and breath sounds normal.  Abdominal: Soft. Bowel sounds are normal. He exhibits no distension and no mass. There is no tenderness. There is no rebound and no guarding.  Musculoskeletal: Normal range of motion. He exhibits no edema.       Left hand: He exhibits normal range of motion and no tenderness. Normal strength noted. He exhibits no finger abduction and no thumb/finger opposition.  Neurological: He is alert and oriented to person, place, and time. No cranial nerve deficit.  Skin: Skin is warm and dry.  Psychiatric: He has a normal mood and affect. His behavior is normal. Judgment and thought content normal.  Nursing note and vitals reviewed.         Assessment & Plan:   Visit for well man health check  Tongue cancer ([place])  Essential hypertension  Testosterone deficiency  OSA on CPAP  Screening for AAA (abdominal aortic aneurysm) - Plan: US Aorta No Follow-up on file.

## 2015-10-13 NOTE — Assessment & Plan Note (Signed)
Doing well. Followed by onc, ENT. Most recent notes reviewed.

## 2015-10-13 NOTE — Progress Notes (Signed)
Pre visit review using our clinic review tool, if applicable. No additional management support is needed unless otherwise documented below in the visit note. 

## 2015-10-13 NOTE — Assessment & Plan Note (Signed)
Well controlled on current rx. No changes made today. 

## 2015-10-13 NOTE — Assessment & Plan Note (Signed)
Not on rx. Asymptomatic. Being monitored by onc.

## 2015-10-13 NOTE — Patient Instructions (Signed)
Great to see you. Please stop by to see Marion on your way out.   

## 2015-10-13 NOTE — Addendum Note (Signed)
Addended by: Lucille Passy on: 10/13/2015 09:17 AM   Modules accepted: Orders, SmartSet

## 2015-10-13 NOTE — Assessment & Plan Note (Addendum)
Reviewed preventive care protocols, scheduled due services, and updated immunizations Discussed nutrition, exercise, diet, and healthy lifestyle.  Order placed for screening abdominal US for aortic aneurysm.

## 2015-10-14 ENCOUNTER — Encounter: Payer: Self-pay | Admitting: Family Medicine

## 2015-10-23 ENCOUNTER — Ambulatory Visit: Payer: Medicare Other

## 2015-10-23 ENCOUNTER — Other Ambulatory Visit: Payer: Self-pay | Admitting: Family Medicine

## 2015-10-23 DIAGNOSIS — Z136 Encounter for screening for cardiovascular disorders: Secondary | ICD-10-CM | POA: Diagnosis not present

## 2015-10-23 DIAGNOSIS — Z8249 Family history of ischemic heart disease and other diseases of the circulatory system: Secondary | ICD-10-CM

## 2015-10-25 ENCOUNTER — Other Ambulatory Visit: Payer: Self-pay | Admitting: Family Medicine

## 2015-10-26 ENCOUNTER — Encounter: Payer: Self-pay | Admitting: Family Medicine

## 2016-07-28 ENCOUNTER — Telehealth: Payer: Self-pay

## 2016-07-28 MED ORDER — NYSTATIN 100000 UNIT/ML MT SUSP: 5.0000 mL | Freq: Four times a day (QID) | OROMUCOSAL | 0 refills | Status: DC

## 2016-07-28 NOTE — Telephone Encounter (Signed)
Pt left v/m; pt request rx for nystatin sent to [facility] [place]; pt's wife has been taking chemotherapy and got a Nystatin rx for thrush. Pt has same symptoms as his wife with S/T and white patch on throat. Today throat feels slightly swollen. Pt wants to know if can get rx of Nystatin without being seen. Pt request b.

## 2016-07-28 NOTE — Telephone Encounter (Signed)
Given the circumstances, it is appropriate for me to send in nystatin rinse without being seen.  I hope everyone feels better and please make sure he makes an appointment if symptoms are not improving as expected.

## 2016-07-29 ENCOUNTER — Ambulatory Visit
Admission: EM | Admit: 2016-07-29 | Discharge: 2016-07-29 | Disposition: A | Discharge status: Home / Self Care | Payer: Medicare Other | Attending: Family Medicine | Admitting: Family Medicine

## 2016-07-29 ENCOUNTER — Telehealth: Payer: Self-pay | Admitting: Family Medicine

## 2016-07-29 ENCOUNTER — Encounter: Payer: Self-pay | Admitting: Emergency Medicine

## 2016-07-29 DIAGNOSIS — J0100 Acute maxillary sinusitis, unspecified: Secondary | ICD-10-CM | POA: Diagnosis not present

## 2016-07-29 LAB — RAPID STREP SCREEN (MED CTR MEBANE ONLY): STREPTOCOCCUS, GROUP A SCREEN (DIRECT): NEGATIVE

## 2016-07-29 MED ORDER — AMOXICILLIN 875 MG PO TABS: 875.0000 mg | ORAL_TABLET | Freq: Two times a day (BID) | ORAL | 0 refills | Status: DC

## 2016-07-29 NOTE — ED Triage Notes (Signed)
Patient c/o sore throat for 3 days.  Patient denies fevers.

## 2016-07-29 NOTE — Telephone Encounter (Signed)
Please check on pt - history of cancer and throat surgery/radition.  If he is having difficulty swallowing, needs to go to the ED.

## 2016-07-29 NOTE — Telephone Encounter (Signed)
Per 07/28/16 phone note Earl Lagos CMA spoke with pt 11:15 and pt was advised Dr Deborra Medina recommended pt go to ED. Earl Lagos said pt acknowledged OK.FYI Dr Deborra Medina.

## 2016-07-29 NOTE — ED Provider Notes (Signed)
[facility]    CSN: [id number] Arrival date & time: 07/29/16  1243     History   Chief Complaint Chief Complaint  Patient presents with  . Sore Throat    HPI Austin Knapp is a 70 y.o. male.   The history is provided by the patient.  URI  Presenting symptoms: congestion (feels post nasal drainage), cough (mild; minimal), rhinorrhea and sore throat   Presenting symptoms: no fever   Severity:  Moderate Onset quality:  Sudden Duration:  4 days Timing:  Constant Progression:  Worsening Chronicity:  New Relieved by:  Nothing Ineffective treatments:  OTC medications and prescription medications Associated symptoms: no headaches, no sinus pain and no wheezing   Risk factors: sick contacts   Risk factors: not elderly, no chronic cardiac disease, no chronic kidney disease, no chronic respiratory disease, no diabetes mellitus, no immunosuppression, no recent illness and no recent travel     Past Medical History:  Diagnosis Date  . Hypertension   . Low testosterone   . Sleep apnea     Patient Active Problem List   Diagnosis Date Noted  . Visit for well man health check 10/13/2015  . Medicare annual wellness visit, subsequent 08/14/2014  . High serum thyroid stimulating hormone (TSH) 08/14/2014  . OSA on CPAP 08/14/2014  . 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 Hypertension   . Testosterone deficiency     Past Surgical History:  Procedure Laterality Date  . COLONOSCOPY WITH PROPOFOL N/A 01/19/2015   Procedure: COLONOSCOPY WITH PROPOFOL;  Surgeon: Manya Silvas, MD;  Location: [facility] ENDOSCOPY;  Service: Endoscopy;  Laterality: N/A;  . HERNIA REPAIR Right 08/2012  . MASTOIDECTOMY    . tongue excision     04/2012  . VASECTOMY         Home Medications    Prior to Admission medications   Medication Sig Start Date End Date Taking? Authorizing Provider  amoxicillin (AMOXIL) 875 MG tablet Take 1 tablet (875 mg total) by mouth 2 (two) times daily. 07/29/16    Norval Gable, MD  ANDROGEL PUMP 20.25 MG/ACT (1.62%) GEL APPLY 4 PUMPS ONCE DAILY AS DIRECTED. 11/18/13   Lucille Passy, MD  aspirin 81 MG tablet Take 81 mg by mouth daily.    Historical Provider, MD  calcium carbonate (OS-CAL) 600 MG TABS Take 600 mg by mouth 2 (two) times daily with a meal.    Historical Provider, MD  Cholecalciferol (VITAMIN D3) 2000 units TABS Take 1 capsule by mouth.    Historical Provider, MD  DOXAZOSIN MESYLATE PO Take 1 tablet by mouth at bedtime.     Historical Provider, MD  fluticasone Asencion Islam) 50 MCG/ACT nasal spray instill 1 spray into each nostril once daily 04/25/14   Lucille Passy, MD  hydrochlorothiazide (MICROZIDE) 12.5 MG capsule take 1 capsule by mouth once daily 10/26/15   Lucille Passy, MD  LUTEIN PO Take 1 tablet by mouth daily.    Historical Provider, MD  Multiple Vitamin (MULTIVITAMIN) tablet Take 1 tablet by mouth daily.    Historical Provider, MD  nystatin (MYCOSTATIN) 100000 UNIT/ML suspension Take 5 mLs (500,000 Units total) by mouth 4 (four) times daily. 07/28/16   Lucille Passy, MD  Zinc 50 MG CAPS Take one by mouth daily    Historical Provider, MD    Family History Family History  Problem Relation Age of Onset  . Macular degeneration Mother   . Cancer Father     Social History Social  History  Substance Use Topics  . Smoking status: Former Research scientist (life sciences)  . Smokeless tobacco: Never Used  . Alcohol use Yes     Comment: rare     Allergies   Clindamycin/lincomycin and Latex   Review of Systems Review of Systems  Constitutional: Negative for fever.  HENT: Positive for congestion (feels post nasal drainage), rhinorrhea and sore throat. Negative for sinus pain.   Respiratory: Positive for cough (mild; minimal). Negative for wheezing.   Neurological: Negative for headaches.     Physical Exam Triage Vital Signs ED Triage Vitals  Enc Vitals Group     BP 07/29/16 1314 (!) 135/91     Pulse Rate 07/29/16 1314 74     Resp 07/29/16 1314 16      Temp 07/29/16 1314 98.3 F (36.8 C)     Temp Source 07/29/16 1314 Oral     SpO2 07/29/16 1314 99 %     Weight 07/29/16 1313 195 lb (88.5 kg)     Height 07/29/16 1313 5\' 9"  (1.753 m)     Head Circumference --      Peak Flow --      Pain Score 07/29/16 1314 6     Pain Loc --      Pain Edu? --      Excl. in [place]? --    No data found.   Updated Vital Signs BP (!) 135/91 (BP Location: Right Arm)   Pulse 74   Temp 98.3 F (36.8 C) (Oral)   Resp 16   Ht 5\' 9"  (1.753 m)   Wt 195 lb (88.5 kg)   SpO2 99%   BMI 28.80 kg/m   Visual Acuity Right Eye Distance:   Left Eye Distance:   Bilateral Distance:    Right Eye Near:   Left Eye Near:    Bilateral Near:     Physical Exam  Constitutional: He appears well-developed and well-nourished. No distress.  HENT:  Head: Normocephalic and atraumatic.  Right Ear: Tympanic membrane, external ear and ear canal normal.  Left Ear: Tympanic membrane, external ear and ear canal normal.  Nose: Mucosal edema present. Right sinus exhibits no frontal sinus tenderness. Left sinus exhibits maxillary sinus tenderness. Left sinus exhibits no frontal sinus tenderness.  Mouth/Throat: Uvula is midline and mucous membranes are normal. Posterior oropharyngeal erythema present. No oropharyngeal exudate, posterior oropharyngeal edema or tonsillar abscesses. No tonsillar exudate.  Thick post-nasal pharyngeal drainage noted  Eyes: Conjunctivae and EOM are normal. Pupils are equal, round, and reactive to light. Right eye exhibits no discharge. Left eye exhibits no discharge. No scleral icterus.  Neck: Normal range of motion. Neck supple. No tracheal deviation present. No thyromegaly present.  Cardiovascular: Normal rate, regular rhythm and normal heart sounds.   Pulmonary/Chest: Effort normal and breath sounds normal. No stridor. No respiratory distress. He has no wheezes. He has no rales. He exhibits no tenderness.  Lymphadenopathy:    He has no cervical  adenopathy.  Neurological: He is alert.  Skin: Skin is warm and dry. No rash noted. He is not diaphoretic.  Nursing note and vitals reviewed.    UC Treatments / Results  Labs (all labs ordered are listed, but only abnormal results are displayed) Labs Reviewed  RAPID STREP SCREEN (NOT AT [place])  CULTURE, GROUP A STREP [facility])    EKG  EKG Interpretation None       Radiology No results found.  Procedures Procedures (including critical care time)  Medications Ordered in UC Medications -  No data to display   Initial Impression / Assessment and Plan / UC Course  I have reviewed the triage vital signs and the nursing notes.  Pertinent labs & imaging results that were available during my care of the patient were reviewed by me and considered in my medical decision making (see chart for details).      Final Clinical Impressions(s) / UC Diagnoses   Final diagnoses:  Acute maxillary sinusitis, recurrence not specified    New Prescriptions New Prescriptions   AMOXICILLIN (AMOXIL) 875 MG TABLET    Take 1 tablet (875 mg total) by mouth 2 (two) times daily.   1. Lab result and diagnosis reviewed with patient 2. rx as per orders above; reviewed possible side effects, interactions, risks and benefits  3. Recommend supportive treatment with flonase nasal spray; increased fluids 4. Follow-up prn if symptoms worsen or don't improve   Norval Gable, MD 07/29/16 1416

## 2016-07-29 NOTE — Telephone Encounter (Signed)
Pt called back checking rx he requested yesterday.  Pt stated he felt like his throat was swelling shut.  I transferred him to [facility].  I made sure I got someone on the phone @ [facility] before transfering call.

## 2016-07-29 NOTE — Telephone Encounter (Signed)
Spoke to pt who states he has already picked up medicine and has began taking it, but has seen no improvement. Pt advised per Dr Deborra Medina to proceed to the ED

## 2016-07-29 NOTE — Telephone Encounter (Signed)
PLEASE NOTE: All timestamps contained within this report are represented as [place] Standard Time. CONFIDENTIALTY NOTICE: This fax transmission is intended only for the addressee. It contains information that is legally privileged, confidential or otherwise protected from use or disclosure. If you are not the intended recipient, you are strictly prohibited from reviewing, disclosing, copying using or disseminating any of this information or taking any action in reliance on or regarding this information. If you have received this fax in error, please notify us immediately by telephone so that we can arrange for its return to [place]. Phone: [phone], Toll-Free: [phone], Fax: [phone] Page: 1 of 1 Call Id: [id number] [place] Patient Name: Austin Knapp DOB: 1946/04/21 Initial Comment Caller says, throat infection , wit is closing up on him, feels like he is having trouble swallowing now. Wife is chemo patient, she has thrush now. Nurse Assessment Nurse: Marcelline Deist, RN, Lynda Date/Time (Eastern Time): 07/29/2016 10:52:13 AM Confirm and document reason for call. If symptomatic, describe symptoms. ---Caller says he thinks he has a throat infection, feels like it is closing up on him, feels like he is having trouble swallowing now. Has had symptoms for 72 hours. Wife is chemo patient, she has thrush now. Called his Dr. Wilburn Mylar, thought it was thrush initially, prescribed Nystatin, has taken 4 doses. No fever. Does the patient have any new or worsening symptoms? ---Yes Will a triage be completed? ---Yes Related visit to physician within the last 2 weeks? ---No Does the PT have any chronic conditions? (i.e. diabetes, asthma, etc.) ---Yes List chronic conditions. ---on BP rx -HCTZ, testosterone gel Is this a behavioral health or substance abuse call? ---No Guidelines Guideline Title Affirmed  Question Affirmed Notes Sore Throat [1] Sore throat is the only symptom AND [2] present > 48 hours Final Disposition User See PCP When Office is Open (within 3 days) Marcelline Deist, Therapist, sports, Kermit Balo Comments No availability for pt. to be seen today at [facility] office. States no fever, can swallow saliva and liquids, but has to concentrate to do that. Does not feel Nystatin is helping. Rates his sore throat a 6 on pain scale. Has some tenderness in lymph node area. Caller frustrated that he has been on phone straining his voice and can't get appt. If there are any cancellations, please contact him at this #. He would like to be seen today if possible and would rather not sit in UC with lots of sick people. Referrals REFERRED TO PCP OFFICE Disagree/Comply: Comply

## 2016-08-01 LAB — CULTURE, GROUP A STREP (THRC)

## 2016-12-26 ENCOUNTER — Other Ambulatory Visit: Payer: Self-pay | Admitting: Family Medicine

## 2016-12-26 ENCOUNTER — Encounter: Payer: Self-pay | Admitting: Family Medicine

## 2016-12-26 ENCOUNTER — Ambulatory Visit (INDEPENDENT_AMBULATORY_CARE_PROVIDER_SITE_OTHER): Payer: Medicare Other | Admitting: Family Medicine

## 2016-12-26 ENCOUNTER — Telehealth: Payer: Self-pay | Admitting: *Deleted

## 2016-12-26 DIAGNOSIS — W57XXXA Bitten or stung by nonvenomous insect and other nonvenomous arthropods, initial encounter: Secondary | ICD-10-CM | POA: Diagnosis not present

## 2016-12-26 DIAGNOSIS — S90465A Insect bite (nonvenomous), left lesser toe(s), initial encounter: Secondary | ICD-10-CM | POA: Diagnosis not present

## 2016-12-26 MED ORDER — HYDROCHLOROTHIAZIDE 12.5 MG PO CAPS: 12.5000 mg | ORAL_CAPSULE | Freq: Every day | ORAL | 3 refills | Status: DC

## 2016-12-26 MED ORDER — SULFAMETHOXAZOLE-TRIMETHOPRIM 400-80 MG PO TABS: 1.0000 | ORAL_TABLET | Freq: Two times a day (BID) | ORAL | 0 refills | Status: DC

## 2016-12-26 MED ORDER — SULFAMETHOXAZOLE-TRIMETHOPRIM 800-160 MG PO TABS: 1.0000 | ORAL_TABLET | Freq: Two times a day (BID) | ORAL | 0 refills | Status: DC

## 2016-12-26 NOTE — Progress Notes (Signed)
Subjective:   Patient ID: Austin Knapp, male    DOB: Jun 15, 1945, 71 y.o.   MRN: [id number]  Austin Knapp is a pleasant 71 y.o. year old male who presents to clinic today with Foot Problem (itching and pain )  on 12/26/2016  HPI:  Bug bite-  Was bitten by something between his left 2nd and third toe over the weekend. He did not see it bite him but immediately noticed itching and swelling around what looked like a bite mark.  Over the past few days, area has been more itchy, more tender, more warm and red.  Has not tried anything for it.  No fever.  Current Outpatient Prescriptions on File Prior to Visit  Medication Sig Dispense Refill  . ANDROGEL PUMP 20.25 MG/ACT (1.62%) GEL APPLY 4 PUMPS ONCE DAILY AS DIRECTED. 450 g 0  . aspirin 81 MG tablet Take 81 mg by mouth daily.    . calcium carbonate (OS-CAL) 600 MG TABS Take 600 mg by mouth 2 (two) times daily with a meal.    . Cholecalciferol (VITAMIN D3) 2000 units TABS Take 1 capsule by mouth.    Marland Kitchen DOXAZOSIN MESYLATE PO Take 1 tablet by mouth at bedtime.     . fluticasone (FLONASE) 50 MCG/ACT nasal spray instill 1 spray into each nostril once daily 16 g 1  . LUTEIN PO Take 1 tablet by mouth daily.    . Multiple Vitamin (MULTIVITAMIN) tablet Take 1 tablet by mouth daily.    Marland Kitchen nystatin (MYCOSTATIN) 100000 UNIT/ML suspension Take 5 mLs (500,000 Units total) by mouth 4 (four) times daily. 60 mL 0  . Zinc 50 MG CAPS Take one by mouth daily    . [DISCONTINUED] lisinopril-hydrochlorothiazide (PRINZIDE,ZESTORETIC) 10-12.5 MG per tablet Take 1 tablet by mouth daily.     No current facility-administered medications on file prior to visit.     Allergies  Allergen Reactions  . Clindamycin/Lincomycin Rash  . Latex     Past Medical History:  Diagnosis Date  . Hypertension   . Low testosterone   . Sleep apnea     Past Surgical History:  Procedure Laterality Date  . COLONOSCOPY WITH PROPOFOL N/A 01/19/2015   Procedure: COLONOSCOPY WITH  PROPOFOL;  Surgeon: Manya Silvas, MD;  Location: [facility] ENDOSCOPY;  Service: Endoscopy;  Laterality: N/A;  . HERNIA REPAIR Right 08/2012  . MASTOIDECTOMY    . tongue excision     04/2012  . VASECTOMY      Family History  Problem Relation Age of Onset  . Macular degeneration Mother   . Cancer Father     Social History   Social History  . Marital status: Married    Spouse name: N/A  . Number of children: N/A  . Years of education: N/A   Occupational History  . Not on file.   Social History Main Topics  . Smoking status: Former Research scientist (life sciences)  . Smokeless tobacco: Never Used  . Alcohol use Yes     Comment: rare  . Drug use: No  . Sexual activity: Yes   Other Topics Concern  . Not on file   Social History Narrative   Patients desires CPR.   Has HPOA.   The PMH, PSH, Social History, Family History, Medications, and allergies have been reviewed in [facility], and have been updated if relevant.   Review of Systems  Constitutional: Negative.   Gastrointestinal: Negative.   Musculoskeletal: Negative.   Skin: Positive for wound.  All other systems  reviewed and are negative.      Objective:    BP 110/80   Pulse 72   Ht 5' 9.5" (1.765 m)   Wt 195 lb (88.5 kg)   SpO2 98%   BMI 28.38 kg/m    Physical Exam  Constitutional: He is oriented to person, place, and time. He appears well-developed and well-nourished. No distress.  HENT:  Head: Normocephalic and atraumatic.  Eyes: Conjunctivae are normal.  Cardiovascular: Normal rate.   Pulmonary/Chest: Effort normal.  Musculoskeletal: Normal range of motion.  Neurological: He is alert and oriented to person, place, and time. No cranial nerve deficit.  Skin: He is not diaphoretic.     Psychiatric: He has a normal mood and affect. His behavior is normal. Judgment and thought content normal.  Nursing note and vitals reviewed.         Assessment & Plan:   Bug bite with infection, initial encounter No Follow-up on  file.

## 2016-12-26 NOTE — Progress Notes (Signed)
Pre visit review using our clinic review tool, if applicable. No additional management support is needed unless otherwise documented below in the visit note. 

## 2016-12-26 NOTE — Assessment & Plan Note (Signed)
New- treat with 7 day course of oral bactrim, advised topical hydrocortisone and oral benadryl as needed for itching. Call or return to clinic prn if these symptoms worsen or fail to improve as anticipated. The patient indicates understanding of these issues and agrees with the plan.

## 2016-12-26 NOTE — Telephone Encounter (Signed)
Noted.  Bactrim DS sent to pharmacy on fine.

## 2016-12-26 NOTE — Telephone Encounter (Signed)
Almyra Free at pharmacy left a voicemail to make sure that the script for Bactrim is single strength and should not be the double? Almyra Free stated that patient has a pretty nasty infection and wants to verify this to make sure before filling the script. Almyra Free stated that this is her dad and to forgive her for questioning this, but she just lost her mom and is looking out after her dad. Almyra Free requested a call back.

## 2016-12-26 NOTE — Patient Instructions (Signed)
Good to see you.  Please take Bactrim as directed- 1 tablet twice daily for 7 days.  Oral Benadryl as needed for itching. Topical hydrocortisone twice daily.

## 2017-08-11 ENCOUNTER — Telehealth: Payer: Self-pay | Admitting: Family Medicine

## 2017-08-11 NOTE — Telephone Encounter (Signed)
Left message asking pt to call office  °

## 2017-08-11 NOTE — Telephone Encounter (Signed)
I can take him back  Any 30 min visit is fine

## 2017-08-11 NOTE — Telephone Encounter (Signed)
See below [place] to schedule  Copied from [place] [id number]. Topic: Appointment Scheduling - Scheduling Inquiry for Clinic >> Aug 11, 2017  1:37 PM Scherrie Gerlach wrote: Reason for CRM:  pt was on the phone with [facility] to ask if Dr Glori Bickers would accept him back as a pt.  Pt states he used to see last 2013. Pt was seeing Dr Deborra Medina and does not want to travel to [place] to see her. Advised Dr Glori Bickers not accepting, and made appt with Dr Silvio Pate for Sept (which was the soonest)  Pt then asked if I would ask if Dr will accept him back, leaving the appt with dr Silvio Pate just in case.

## 2017-09-12 ENCOUNTER — Encounter: Payer: Self-pay | Admitting: Family Medicine

## 2017-09-12 ENCOUNTER — Ambulatory Visit: Payer: Medicare Other | Admitting: Family Medicine

## 2017-09-12 VITALS — BP 126/78 | HR 61 | Temp 97.9°F | Ht 69.0 in | Wt 188.2 lb

## 2017-09-12 DIAGNOSIS — Z125 Encounter for screening for malignant neoplasm of prostate: Secondary | ICD-10-CM | POA: Diagnosis not present

## 2017-09-12 DIAGNOSIS — E039 Hypothyroidism, unspecified: Secondary | ICD-10-CM | POA: Insufficient documentation

## 2017-09-12 DIAGNOSIS — E349 Endocrine disorder, unspecified: Secondary | ICD-10-CM | POA: Diagnosis not present

## 2017-09-12 DIAGNOSIS — Z9989 Dependence on other enabling machines and devices: Secondary | ICD-10-CM

## 2017-09-12 DIAGNOSIS — I10 Essential (primary) hypertension: Secondary | ICD-10-CM

## 2017-09-12 DIAGNOSIS — G4733 Obstructive sleep apnea (adult) (pediatric): Secondary | ICD-10-CM | POA: Diagnosis not present

## 2017-09-12 DIAGNOSIS — C029 Malignant neoplasm of tongue, unspecified: Secondary | ICD-10-CM | POA: Diagnosis not present

## 2017-09-12 DIAGNOSIS — E038 Other specified hypothyroidism: Secondary | ICD-10-CM

## 2017-09-12 DIAGNOSIS — Z8249 Family history of ischemic heart disease and other diseases of the circulatory system: Secondary | ICD-10-CM | POA: Insufficient documentation

## 2017-09-12 LAB — LIPID PANEL
CHOLESTEROL: 148 mg/dL (ref 0–200)
HDL: 41.4 mg/dL (ref >39.00)
LDL Cholesterol: 91 mg/dL (ref 0–99)
NonHDL: 106.57
Total CHOL/HDL Ratio: 4
Triglycerides: 78 mg/dL (ref 0.0–149.0)
VLDL: 15.6 mg/dL (ref 0.0–40.0)

## 2017-09-12 LAB — CBC WITH DIFFERENTIAL/PLATELET
Basophils Absolute: 0 10*3/uL (ref 0.0–0.1)
Basophils Relative: 0.9 % (ref 0.0–3.0)
EOS PCT: 3.7 % (ref 0.0–5.0)
Eosinophils Absolute: 0.2 10*3/uL (ref 0.0–0.7)
HEMATOCRIT: 49.3 % (ref 39.0–52.0)
HEMOGLOBIN: 16.9 g/dL (ref 13.0–17.0)
LYMPHS PCT: 18 % (ref 12.0–46.0)
Lymphs Abs: 1 10*3/uL (ref 0.7–4.0)
MCHC: 34.2 g/dL (ref 30.0–36.0)
MCV: 94.2 fl (ref 78.0–100.0)
MONOS PCT: 10.8 % (ref 3.0–12.0)
Monocytes Absolute: 0.6 10*3/uL (ref 0.1–1.0)
Neutro Abs: 3.8 10*3/uL (ref 1.4–7.7)
Neutrophils Relative %: 66.6 % (ref 43.0–77.0)
Platelets: 183 10*3/uL (ref 150.0–400.0)
RBC: 5.24 Mil/uL (ref 4.22–5.81)
RDW: 13.3 % (ref 11.5–15.5)
WBC: 5.8 10*3/uL (ref 4.0–10.5)

## 2017-09-12 LAB — COMPREHENSIVE METABOLIC PANEL
ALBUMIN: 4.3 g/dL (ref 3.5–5.2)
ALT: 29 U/L (ref 0–53)
AST: 24 U/L (ref 0–37)
Alkaline Phosphatase: 51 U/L (ref 39–117)
BILIRUBIN TOTAL: 0.7 mg/dL (ref 0.2–1.2)
BUN: 21 mg/dL (ref 6–23)
CALCIUM: 9.7 mg/dL (ref 8.4–10.5)
CO2: 32 mEq/L (ref 19–32)
Chloride: 101 mEq/L (ref 96–112)
Creatinine, Ser: 0.77 mg/dL (ref 0.40–1.50)
GFR: 105.89 mL/min (ref >60.00)
Glucose, Bld: 89 mg/dL (ref 70–99)
POTASSIUM: 4.5 meq/L (ref 3.5–5.1)
Sodium: 139 mEq/L (ref 135–145)
TOTAL PROTEIN: 6.8 g/dL (ref 6.0–8.3)

## 2017-09-12 LAB — PSA, MEDICARE: PSA: 1.11 ng/mL (ref 0.10–4.00)

## 2017-09-12 LAB — TSH: TSH: 6.48 u[IU]/mL — ABNORMAL HIGH (ref 0.35–4.50)

## 2017-09-12 LAB — T4, FREE: Free T4: 0.74 ng/dL (ref 0.60–1.60)

## 2017-09-12 NOTE — Assessment & Plan Note (Signed)
In setting of testosterone tx  No change in urinary habits  psa lab today

## 2017-09-12 NOTE — Patient Instructions (Addendum)
Please call or email me with your dates for  Last Tetanus shot /Tdap Last prevnar  Last pneumovax 23   Labs today   Keep taking good care of yourself

## 2017-09-12 NOTE — Assessment & Plan Note (Signed)
Pt continues androgel per past provider psa today

## 2017-09-12 NOTE — Progress Notes (Signed)
Subjective:    Patient ID: Austin Knapp, male    DOB: [birth year], 72 y.o.   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) = followed by [facility]  Lab Results  Component Value Date   TSH 6.60 (H) 10/06/2015   his ENT was watching this  Is overdue for labs  Not symptomatic    bp is stable today  No cp or palpitations or headaches or edema  No side effects to medicines  BP Readings from Last 3 Encounters:  09/12/17 126/78  12/26/16 110/80  07/29/16 (!) 135/91    hctz 12.5 mg   Lab Results  Component Value Date   CREATININE 0.88 10/06/2015   BUN 15 10/06/2015   NA 139 10/06/2015   K 5.0 10/06/2015   CL 101 10/06/2015   CO2 32 10/06/2015   Hx of low testosterone-it really helps  Taking androgel for years  Lab Results  Component Value Date   PSA 0.69 10/06/2015   PSA 0.94 08/11/2014   PSA 0.76 08/30/2013  no hx of BPH that he knows of  Due for psa  Nocturia - usually 1 time max    Hx of OSA on bipap - it is helpful  Air is humidified   Last tetanus shot ? -daughter knows    PNA vaccines  7/15  Flu shot 9/18  Colonoscopy 8/16 Dr Vira Agar - he is 5 year recall    Had the shingrix vaccine 8/18   Patient Active Problem List   Diagnosis Date Noted  . Prostate cancer screening 09/12/2017  . Family history of abdominal aortic aneurysm (AAA) 09/12/2017  . Subclinical  hypothyroidism 09/12/2017  . OSA on CPAP 08/14/2014  . Tongue cancer ([place]) 11/19/2012  . Hypertension   . Testosterone deficiency    Past Medical History:  Diagnosis Date  . Hypertension   . Low testosterone   . Sleep apnea    Past Surgical History:  Procedure Laterality Date  . COLONOSCOPY WITH PROPOFOL N/A 01/19/2015   Procedure: COLONOSCOPY WITH PROPOFOL;  Surgeon: Manya Silvas, MD;  Location: [facility] ENDOSCOPY;  Service: Endoscopy;  Laterality: N/A;  . HERNIA REPAIR Right 08/2012  . MASTOIDECTOMY    . tongue excision     04/2012  . VASECTOMY     Social History   Tobacco Use  . Smoking status: Former Research scientist (life sciences)  . Smokeless tobacco: Never Used  Substance Use Topics  . Alcohol use: Yes    Comment: rare  . Drug use: No   Family History  Problem Relation Age of Onset  . Macular degeneration Mother   . Cancer Father   . AAA (abdominal aortic aneurysm) Father   . AAA (abdominal aortic aneurysm) Sister    Allergies  Allergen Reactions  . Clindamycin/Lincomycin Rash  . Latex    Current Outpatient Medications on File Prior to Visit  Medication Sig Dispense Refill  . ANDROGEL PUMP 20.25 MG/ACT (1.62%) GEL APPLY 4 PUMPS ONCE DAILY AS DIRECTED. 450 g 0  . aspirin 81 MG tablet Take 81 mg by mouth daily.    . calcium carbonate (OS-CAL) 600 MG TABS Take 600 mg by mouth 2 (two) times daily with a meal.    . Cholecalciferol (VITAMIN D3) 2000 units TABS Take 1 capsule by mouth.    Marland Kitchen DOXAZOSIN MESYLATE PO Take 1 tablet by mouth at bedtime.     . fluticasone (FLONASE) 50 MCG/ACT nasal spray instill 1 spray into each nostril once daily 16 g 1  . hydrochlorothiazide (MICROZIDE) 12.5 MG capsule Take 1 capsule (12.5 mg total) by mouth daily. 90 capsule 3  . Multiple Vitamin (MULTIVITAMIN) tablet Take 1 tablet by mouth daily.    . [DISCONTINUED] lisinopril-hydrochlorothiazide (PRINZIDE,ZESTORETIC) 10-12.5 MG per tablet Take 1 tablet by mouth daily.     No current facility-administered  medications on file prior to visit.      Review of Systems  Constitutional: Negative for activity change, appetite change, fatigue, fever and unexpected weight change.  HENT: Negative for congestion, rhinorrhea, sore throat and trouble swallowing.   Eyes: Negative for pain, redness, itching and visual disturbance.  Respiratory: Negative for cough, chest tightness, shortness of breath and wheezing.   Cardiovascular: Negative for chest pain and palpitations.  Gastrointestinal: Negative for abdominal pain, blood in stool, constipation, diarrhea and nausea.  Endocrine: Negative for cold intolerance, heat intolerance, polydipsia and polyuria.  Genitourinary: Negative for difficulty urinating, dysuria, frequency and urgency.  Musculoskeletal: Negative for arthralgias, joint swelling and myalgias.  Skin: Negative for pallor and rash.  Neurological: Negative for dizziness, tremors, weakness, numbness and headaches.  Hematological: Negative for adenopathy. Does not bruise/bleed easily.  Psychiatric/Behavioral: Negative for decreased concentration and dysphoric mood. The patient is not nervous/anxious.        Objective:   Physical Exam  Constitutional: He appears well-developed and well-nourished. No distress.  Well appearing   HENT:  Head: Normocephalic and atraumatic.  Mouth/Throat: Oropharynx is clear and moist.  Eyes: Pupils are equal, round, and reactive to light. Conjunctivae and EOM are normal.  Neck: Normal range of motion. Neck supple. No JVD present. Carotid bruit is not present. No thyromegaly present.  No goiter   Cardiovascular: Normal rate, regular rhythm, normal heart sounds and intact distal pulses. Exam reveals no gallop.  Pulmonary/Chest: Effort normal and breath sounds normal. No respiratory distress. He has no wheezes. He has no rales.  No crackles  Abdominal: Soft. Bowel sounds are normal. He exhibits no distension, no abdominal bruit and no mass. There is no tenderness.    Musculoskeletal: He exhibits no edema.  Lymphadenopathy:    He has no cervical adenopathy.  Neurological: He is alert. He has normal reflexes. He displays no tremor. No cranial nerve deficit. He exhibits normal muscle tone. Coordination normal.  Skin: Skin is warm and dry. No rash noted. No pallor.  Psychiatric: He has a normal mood and affect.          Assessment & Plan:   Problem List Items Addressed This Visit      Cardiovascular and Mediastinum   Hypertension - Primary    bp in fair control at this time  BP Readings from Last 1 Encounters:  09/12/17 126/78   No changes needed Disc lifstyle change with  low sodium diet and exercise  Labs today  Good habits       Relevant Orders   CBC with Differential/Platelet (Completed)   Comprehensive metabolic panel (Completed)   Lipid panel (Completed)   TSH (Completed)     Respiratory   OSA on CPAP    Pt states he is now on Bipap  Causes dry mouth-but tolerates it most of the time         Digestive   Tongue cancer ([place])    Continues routine ENT f/u and doing well  Watching thyroid fxn s/p radio tx         Endocrine   Subclinical hypothyroidism    Due for TSH and free T4 Watched by ENT S/p rad tx for tongue cancer No symptoms  No goiter       Relevant Orders   TSH (Completed)     Other   Family history of abdominal aortic aneurysm (AAA)    Father and sister Personal screening neg 5/17 Continue to watch       Relevant Orders   T4, Free (Completed)   Prostate cancer screening    In setting of testosterone tx  No change in urinary habits  psa lab today      Relevant Orders   PSA, Medicare (Completed)   Testosterone deficiency    Pt continues androgel per past provider psa today

## 2017-09-12 NOTE — Assessment & Plan Note (Signed)
Pt states he is now on Bipap  Causes dry mouth-but tolerates it most of the time

## 2017-09-12 NOTE — Assessment & Plan Note (Signed)
bp in fair control at this time  BP Readings from Last 1 Encounters:  09/12/17 126/78   No changes needed Disc lifstyle change with low sodium diet and exercise  Labs today  Good habits

## 2017-09-12 NOTE — Assessment & Plan Note (Signed)
Continues routine ENT f/u and doing well  Watching thyroid fxn s/p radio tx

## 2017-09-12 NOTE — Assessment & Plan Note (Signed)
Father and sister Personal screening neg 5/17 Continue to watch

## 2017-09-12 NOTE — Assessment & Plan Note (Signed)
Due for TSH and free T4 Watched by ENT S/p rad tx for tongue cancer No symptoms  No goiter

## 2017-09-16 ENCOUNTER — Encounter: Payer: Self-pay | Admitting: Family Medicine

## 2017-10-25 ENCOUNTER — Other Ambulatory Visit: Payer: Self-pay | Admitting: *Deleted

## 2017-10-25 MED ORDER — HYDROCHLOROTHIAZIDE 12.5 MG PO CAPS: 12.5000 mg | ORAL_CAPSULE | Freq: Every day | ORAL | 2 refills | Status: DC

## 2018-02-20 ENCOUNTER — Ambulatory Visit: Payer: Medicare Other | Admitting: Internal Medicine

## 2018-06-25 ENCOUNTER — Encounter: Payer: Self-pay | Admitting: Family Medicine

## 2018-06-25 ENCOUNTER — Ambulatory Visit: Payer: Medicare Other | Admitting: Family Medicine

## 2018-06-25 VITALS — BP 128/76 | HR 65 | Temp 98.1°F | Ht 69.0 in | Wt 184.8 lb

## 2018-06-25 DIAGNOSIS — R197 Diarrhea, unspecified: Secondary | ICD-10-CM

## 2018-06-25 NOTE — Patient Instructions (Signed)

## 2018-06-25 NOTE — Progress Notes (Signed)
Subjective:     Austin Knapp is a 73 y.o. male presenting for Diarrhea (Symptoms started on thursday evening-06/21/2018, with severe vomiting and diarrhea. Vomiting stopped on 06/22/2018. Diarrhea is continuing. Not able to drink or eat due to having diarrhea right after. By patient's scales at home he has lost 11lbs since 06/21/2018. Feels weak. No fever that he knows of. Has taking Align, Immodium, Pepto)     Diarrhea   This is a new problem. The current episode started in the past 7 days. The problem occurs more than 10 times per day. The problem has been unchanged. The stool consistency is described as watery. The patient states that diarrhea awakens him from sleep. Associated symptoms include headaches and vomiting. Pertinent negatives include no abdominal pain, arthralgias, chills, fever or myalgias. Exacerbated by: eating or drinking. Risk factors include ill contacts. He has tried change of diet and anti-motility drug (peptobismal, probiotic) for the symptoms. The treatment provided no relief.     Review of Systems  Constitutional: Negative for chills and fever.  Gastrointestinal: Positive for diarrhea and vomiting. Negative for abdominal pain.  Musculoskeletal: Negative for arthralgias and myalgias.  Neurological: Positive for headaches.     Social History   Tobacco Use  Smoking Status Former Smoker  Smokeless Tobacco Never Used        Objective:    BP Readings from Last 3 Encounters:  06/25/18 128/76  09/12/17 126/78  12/26/16 110/80   Wt Readings from Last 3 Encounters:  06/25/18 184 lb 12 oz (83.8 kg)  09/12/17 188 lb 4 oz (85.4 kg)  12/26/16 195 lb (88.5 kg)    BP 128/76   Pulse 65   Temp 98.1 F (36.7 C)   Ht 5\' 9"  (1.753 m)   Wt 184 lb 12 oz (83.8 kg)   SpO2 99%   BMI 27.28 kg/m    Physical Exam Constitutional:      General: He is not in acute distress.    Appearance: He is well-developed. He is not diaphoretic.  HENT:     Head: Normocephalic  and atraumatic.     Right Ear: Tympanic membrane and ear canal normal.     Left Ear: Tympanic membrane and ear canal normal.     Nose: Nose normal.     Mouth/Throat:     Mouth: Mucous membranes are moist.     Pharynx: Uvula midline.     Comments: Dry lips Eyes:     General: No scleral icterus.    Conjunctiva/sclera: Conjunctivae normal.     Pupils: Pupils are equal, round, and reactive to light.  Neck:     Musculoskeletal: Normal range of motion and neck supple.  Cardiovascular:     Rate and Rhythm: Normal rate and regular rhythm.     Heart sounds: Normal heart sounds. No murmur.  Pulmonary:     Effort: Pulmonary effort is normal. No respiratory distress.     Breath sounds: Normal breath sounds. No wheezing.  Abdominal:     General: Bowel sounds are normal. There is no distension.     Palpations: Abdomen is soft. There is no mass.     Tenderness: There is abdominal tenderness (mild diffuse). There is no guarding.  Musculoskeletal: Normal range of motion.  Lymphadenopathy:     Cervical: No cervical adenopathy.  Skin:    General: Skin is warm and dry.     Capillary Refill: Capillary refill takes less than 2 seconds.  Neurological:  Mental Status: He is alert and oriented to person, place, and time.           Assessment & Plan:   Problem List Items Addressed This Visit    None    Visit Diagnoses    Diarrhea of presumed infectious origin    -  Primary   Relevant Orders   Gastrointestinal Pathogen Panel PCR   Stool culture     Given age >70 and significantly more than 10 BM daily he meets guidelines for severe diarrhea. Time course could still be viral in origin but will rule-out bacterial causes given severity of illness.   Return if symptoms worsen or fail to improve.  Lesleigh Noe, MD

## 2018-09-05 ENCOUNTER — Ambulatory Visit: Payer: Medicare Other

## 2018-09-12 ENCOUNTER — Ambulatory Visit: Payer: Medicare Other

## 2018-10-01 ENCOUNTER — Encounter: Payer: Self-pay | Admitting: Family Medicine

## 2018-10-01 ENCOUNTER — Ambulatory Visit (INDEPENDENT_AMBULATORY_CARE_PROVIDER_SITE_OTHER)
Admission: RE | Admit: 2018-10-01 | Discharge: 2018-10-01 | Disposition: A | Discharge status: Home / Self Care | Payer: Medicare Other | Source: Ambulatory Visit | Attending: Family Medicine | Admitting: Family Medicine

## 2018-10-01 ENCOUNTER — Other Ambulatory Visit: Payer: Self-pay

## 2018-10-01 ENCOUNTER — Ambulatory Visit: Payer: Medicare Other | Admitting: Family Medicine

## 2018-10-01 VITALS — BP 138/92 | HR 57 | Temp 98.1°F | Ht 69.0 in | Wt 195.1 lb

## 2018-10-01 DIAGNOSIS — M25522 Pain in left elbow: Secondary | ICD-10-CM | POA: Diagnosis not present

## 2018-10-01 DIAGNOSIS — M79642 Pain in left hand: Secondary | ICD-10-CM | POA: Insufficient documentation

## 2018-10-01 DIAGNOSIS — C029 Malignant neoplasm of tongue, unspecified: Secondary | ICD-10-CM | POA: Diagnosis not present

## 2018-10-01 DIAGNOSIS — I10 Essential (primary) hypertension: Secondary | ICD-10-CM | POA: Diagnosis not present

## 2018-10-01 NOTE — Progress Notes (Signed)
Subjective:    Patient ID: Austin Knapp, male    DOB: 04-02-1946, 73 y.o.   MRN: [id number]  HPI Here for injury of L hand   He is R handed   Injured himself feb 22  He was loading a trailer  3,4th fingers were overly flexed (did not hurt severely he just noticed it ) Then the 4th finger looked "droopy" Those fingers swelled  4th finger is pink in color  Did not bruise  Tip of his 4th finger feels tingly (but not numb)  Now having trouble curling his fingers  (it hurts)   He is able to extend  He has not taken any medicines otc   He splinted 4th finger straight for 7 weeks with splint from [employer]  Was comfortable in that   Elbow has been sore for a while also - likely unrelated to injury Worse with resisted flexion  No swelling   Patient Active Problem List   Diagnosis Date Noted  . Left hand pain 10/01/2018  . Left elbow pain 10/01/2018  . Prostate cancer screening 09/12/2017  . Family history of abdominal aortic aneurysm (AAA) 09/12/2017  . Subclinical hypothyroidism 09/12/2017  . OSA on CPAP 08/14/2014  . Tongue cancer ([place]) 11/19/2012  . Hypertension   . Testosterone deficiency    Past Medical History:  Diagnosis Date  . Hypertension   . Low testosterone   . Sleep apnea    Past Surgical History:  Procedure Laterality Date  . COLONOSCOPY WITH PROPOFOL N/A 01/19/2015   Procedure: COLONOSCOPY WITH PROPOFOL;  Surgeon: Manya Silvas, MD;  Location: [facility] ENDOSCOPY;  Service: Endoscopy;  Laterality: N/A;  . HERNIA REPAIR Right 08/2012  . MASTOIDECTOMY    . tongue excision     04/2012  . VASECTOMY     Social History   Tobacco Use  . Smoking status: Former Research scientist (life sciences)  . Smokeless tobacco: Never Used  Substance Use Topics  . Alcohol use: Yes    Comment: rare  . Drug use: No   Family History  Problem Relation Age of Onset  . Macular degeneration Mother   . Cancer Father   . AAA (abdominal aortic aneurysm) Father   . AAA (abdominal aortic aneurysm)  Sister    Allergies  Allergen Reactions  . Clindamycin/Lincomycin Rash  . Latex    Current Outpatient Medications on File Prior to Visit  Medication Sig Dispense Refill  . ANDROGEL PUMP 20.25 MG/ACT (1.62%) GEL APPLY 4 PUMPS ONCE DAILY AS DIRECTED. 450 g 0  . aspirin 81 MG tablet Take 81 mg by mouth daily.    . calcium carbonate (OS-CAL) 600 MG TABS Take 600 mg by mouth 2 (two) times daily with a meal.    . Cholecalciferol (VITAMIN D3) 2000 units TABS Take 1 capsule by mouth.    . doxazosin (CARDURA) 2 MG tablet Take 1 tablet by mouth daily.    Marland Kitchen DOXAZOSIN MESYLATE PO Take 1 tablet by mouth at bedtime.     . fluticasone (FLONASE) 50 MCG/ACT nasal spray instill 1 spray into each nostril once daily 16 g 1  . hydrochlorothiazide (MICROZIDE) 12.5 MG capsule Take 1 capsule (12.5 mg total) by mouth daily. 90 capsule 2  . Multiple Vitamin (MULTIVITAMIN) tablet Take 1 tablet by mouth daily.    . [DISCONTINUED] lisinopril-hydrochlorothiazide (PRINZIDE,ZESTORETIC) 10-12.5 MG per tablet Take 1 tablet by mouth daily.     No current facility-administered medications on file prior to visit.  Review of Systems  Constitutional: Negative for activity change, appetite change, fatigue, fever and unexpected weight change.  HENT: Negative for congestion, rhinorrhea, sore throat and trouble swallowing.   Eyes: Negative for pain, redness, itching and visual disturbance.  Respiratory: Negative for cough, chest tightness, shortness of breath and wheezing.   Cardiovascular: Negative for chest pain and palpitations.  Gastrointestinal: Negative for abdominal pain, blood in stool, constipation, diarrhea and nausea.  Endocrine: Negative for cold intolerance, heat intolerance, polydipsia and polyuria.  Genitourinary: Negative for difficulty urinating, dysuria, frequency and urgency.  Musculoskeletal: Positive for arthralgias. Negative for joint swelling, myalgias and neck pain.       L hand and elbow  Skin:  Negative for pallor and rash.  Neurological: Negative for dizziness, tremors, weakness, numbness and headaches.  Hematological: Negative for adenopathy. Does not bruise/bleed easily.  Psychiatric/Behavioral: Negative for decreased concentration and dysphoric mood. The patient is not nervous/anxious.        Objective:   Physical Exam Constitutional:      General: He is not in acute distress.    Appearance: Normal appearance. He is normal weight. He is not ill-appearing.  HENT:     Head: Normocephalic and atraumatic.  Eyes:     General: No scleral icterus.    Extraocular Movements: Extraocular movements intact.     Conjunctiva/sclera: Conjunctivae normal.     Pupils: Pupils are equal, round, and reactive to light.  Cardiovascular:     Rate and Rhythm: Bradycardia present.     Pulses: Normal pulses.  Pulmonary:     Effort: Pulmonary effort is normal. No respiratory distress.  Musculoskeletal:     Left hand: He exhibits decreased range of motion, tenderness, bony tenderness and swelling. He exhibits normal two-point discrimination, normal capillary refill, no deformity and no laceration. Decreased sensation noted. Normal strength noted.     Comments: L hand  Mild swelling of 3, 4th fingers (moreso 4th)  Pink color to both/well perfused  Some tenderness over distal PIP in 4th finger   Unable to fully flex those fingers due to pain Extension is normal   Slightly dec sens to lt touch at tip of L 4th finger only   Some tenderness over lateral epicondyle of elbow with no swelling and nl rom     Skin:    General: Skin is warm and dry.     Capillary Refill: Capillary refill takes less than 2 seconds.     Findings: No rash.  Neurological:     Mental Status: He is alert.     Cranial Nerves: Cranial nerves are intact.     Sensory: Sensory deficit present.     Motor: No weakness, tremor, atrophy or abnormal muscle tone.  Psychiatric:        Mood and Affect: Mood normal.      Comments: Pleasant and talkative            Assessment & Plan:   Problem List Items Addressed This Visit      Cardiovascular and Mediastinum   Hypertension    Pt forgot to take his hctz today BP: (!) 138/92          Relevant Medications   doxazosin (CARDURA) 2 MG tablet     Digestive   Tongue cancer ([facility])    No clinical changes        Other   Left hand pain - Primary    Injury occurred several months ago when his 3,4th fingers were hyperflexed while loading  the car  Since then he has had swelling and pain of both fingers (worse in 4th) moreso with flexion He did splint his 4th finger into extension with an otc splint for 6-7 weeks (it helped pain)  Will xray today to r/o fx or dislocation  ? If flexor tendon injury  May require visit to ortho/hand for eval -will wait for xray result first  Adv trial of cold compress to affected area 10 minutes at a time  Also nsaid if bp is better and if tolerated       Relevant Orders   DG Hand Complete Left   Left elbow pain    Ongoing -worse with resisted flexion but no swelling  Tender over lateral epicondyle  Recommend ice/forearm band and relative rest  nsaid prn as tolerated

## 2018-10-01 NOTE — Assessment & Plan Note (Signed)
Pt forgot to take his hctz today BP: (!) 138/92

## 2018-10-01 NOTE — Assessment & Plan Note (Signed)
Injury occurred several months ago when his 3,4th fingers were hyperflexed while loading the car  Since then he has had swelling and pain of both fingers (worse in 4th) moreso with flexion He did splint his 4th finger into extension with an otc splint for 6-7 weeks (it helped pain)  Will xray today to r/o fx or dislocation  ? If flexor tendon injury  May require visit to ortho/hand for eval -will wait for xray result first  Adv trial of cold compress to affected area 10 minutes at a time  Also nsaid if bp is better and if tolerated

## 2018-10-01 NOTE — Assessment & Plan Note (Signed)
No clinical changes 

## 2018-10-01 NOTE — Assessment & Plan Note (Signed)
Ongoing -worse with resisted flexion but no swelling  Tender over lateral epicondyle  Recommend ice/forearm band and relative rest  nsaid prn as tolerated

## 2018-10-01 NOTE — Patient Instructions (Addendum)
An anti inflammatory like ibuprofen or naproxen (taken with food) may help pain and inflammation  Do not take if bp is high   Ice/cold compress 10 minutes whenever you can   Xray now  We will call with report and make a plan

## 2018-10-02 ENCOUNTER — Telehealth: Payer: Self-pay | Admitting: Family Medicine

## 2018-10-02 DIAGNOSIS — M79642 Pain in left hand: Secondary | ICD-10-CM

## 2018-10-02 NOTE — Telephone Encounter (Signed)
-----   Message from Myriam Forehand, [place] sent at 10/02/2018 11:53 AM EDT ----- Hulen Skains and spoke with pt. Pt would like the referral placed to somewhere local or in [place] and pt would no care to see anyone within [facility].   Sent to PCP

## 2018-10-05 NOTE — Telephone Encounter (Signed)
Ortho Hand Appt made with Dr Milly Jakob on 10/09/18 at 12:00, patient notified.

## 2018-12-31 ENCOUNTER — Telehealth: Payer: Self-pay | Admitting: Family Medicine

## 2018-12-31 DIAGNOSIS — E039 Hypothyroidism, unspecified: Secondary | ICD-10-CM

## 2018-12-31 DIAGNOSIS — E038 Other specified hypothyroidism: Secondary | ICD-10-CM

## 2018-12-31 DIAGNOSIS — I10 Essential (primary) hypertension: Secondary | ICD-10-CM

## 2018-12-31 DIAGNOSIS — E349 Endocrine disorder, unspecified: Secondary | ICD-10-CM

## 2018-12-31 DIAGNOSIS — Z125 Encounter for screening for malignant neoplasm of prostate: Secondary | ICD-10-CM

## 2018-12-31 NOTE — Telephone Encounter (Signed)
I put the orders in 

## 2018-12-31 NOTE — Telephone Encounter (Signed)
Pt is getting labs on Friday 01/04/19 and wants to know will PSA, Thyroid function, and Testerone levels will be included in his labs so he don't have to take multiple tests. I told him I was unsure and would ask.

## 2018-12-31 NOTE — Telephone Encounter (Signed)
Pt notified orders are done

## 2019-01-04 ENCOUNTER — Other Ambulatory Visit: Payer: Self-pay

## 2019-01-04 ENCOUNTER — Ambulatory Visit (INDEPENDENT_AMBULATORY_CARE_PROVIDER_SITE_OTHER): Payer: Medicare Other

## 2019-01-04 ENCOUNTER — Other Ambulatory Visit (INDEPENDENT_AMBULATORY_CARE_PROVIDER_SITE_OTHER): Payer: Medicare Other

## 2019-01-04 VITALS — Ht 70.0 in | Wt 200.0 lb

## 2019-01-04 DIAGNOSIS — E039 Hypothyroidism, unspecified: Secondary | ICD-10-CM

## 2019-01-04 DIAGNOSIS — Z125 Encounter for screening for malignant neoplasm of prostate: Secondary | ICD-10-CM

## 2019-01-04 DIAGNOSIS — Z0000 Encounter for general adult medical examination without abnormal findings: Secondary | ICD-10-CM | POA: Diagnosis not present

## 2019-01-04 DIAGNOSIS — I10 Essential (primary) hypertension: Secondary | ICD-10-CM | POA: Diagnosis not present

## 2019-01-04 DIAGNOSIS — E349 Endocrine disorder, unspecified: Secondary | ICD-10-CM | POA: Diagnosis not present

## 2019-01-04 DIAGNOSIS — E038 Other specified hypothyroidism: Secondary | ICD-10-CM

## 2019-01-04 LAB — T4, FREE: Free T4: 0.64 ng/dL (ref 0.60–1.60)

## 2019-01-04 LAB — LIPID PANEL
Cholesterol: 172 mg/dL (ref 0–200)
HDL: 39.4 mg/dL (ref >39.00)
LDL Cholesterol: 113 mg/dL — ABNORMAL HIGH (ref 0–99)
NonHDL: 132.43
Total CHOL/HDL Ratio: 4
Triglycerides: 97 mg/dL (ref 0.0–149.0)
VLDL: 19.4 mg/dL (ref 0.0–40.0)

## 2019-01-04 LAB — CBC WITH DIFFERENTIAL/PLATELET
Basophils Absolute: 0 10*3/uL (ref 0.0–0.1)
Basophils Relative: 0.7 % (ref 0.0–3.0)
Eosinophils Absolute: 0.2 10*3/uL (ref 0.0–0.7)
Eosinophils Relative: 4 % (ref 0.0–5.0)
HCT: 51.5 % (ref 39.0–52.0)
Hemoglobin: 17.3 g/dL — ABNORMAL HIGH (ref 13.0–17.0)
Lymphocytes Relative: 24 % (ref 12.0–46.0)
Lymphs Abs: 1.2 10*3/uL (ref 0.7–4.0)
MCHC: 33.5 g/dL (ref 30.0–36.0)
MCV: 96.7 fl (ref 78.0–100.0)
Monocytes Absolute: 0.5 10*3/uL (ref 0.1–1.0)
Monocytes Relative: 11.3 % (ref 3.0–12.0)
Neutro Abs: 2.9 10*3/uL (ref 1.4–7.7)
Neutrophils Relative %: 60 % (ref 43.0–77.0)
Platelets: 160 10*3/uL (ref 150.0–400.0)
RBC: 5.33 Mil/uL (ref 4.22–5.81)
RDW: 14 % (ref 11.5–15.5)
WBC: 4.8 10*3/uL (ref 4.0–10.5)

## 2019-01-04 LAB — COMPREHENSIVE METABOLIC PANEL
ALT: 23 U/L (ref 0–53)
AST: 22 U/L (ref 0–37)
Albumin: 4.3 g/dL (ref 3.5–5.2)
Alkaline Phosphatase: 55 U/L (ref 39–117)
BUN: 24 mg/dL — ABNORMAL HIGH (ref 6–23)
CO2: 32 mEq/L (ref 19–32)
Calcium: 9.6 mg/dL (ref 8.4–10.5)
Chloride: 103 mEq/L (ref 96–112)
Creatinine, Ser: 0.95 mg/dL (ref 0.40–1.50)
GFR: 77.89 mL/min (ref >60.00)
Glucose, Bld: 97 mg/dL (ref 70–99)
Potassium: 4.9 mEq/L (ref 3.5–5.1)
Sodium: 140 mEq/L (ref 135–145)
Total Bilirubin: 0.6 mg/dL (ref 0.2–1.2)
Total Protein: 6.3 g/dL (ref 6.0–8.3)

## 2019-01-04 LAB — TESTOSTERONE: Testosterone: 669.88 ng/dL (ref 300.00–890.00)

## 2019-01-04 LAB — TSH: TSH: 5.26 u[IU]/mL — ABNORMAL HIGH (ref 0.35–4.50)

## 2019-01-04 LAB — PSA, MEDICARE: PSA: 1.13 ng/ml (ref 0.10–4.00)

## 2019-01-04 NOTE — Patient Instructions (Signed)
Mr. Austin Knapp , Thank you for taking time to come for your Medicare Wellness Visit. I appreciate your ongoing commitment to your health goals. Please review the following plan we discussed and let me know if I can assist you in the future.   Screening recommendations/referrals: Colonoscopy: 01/2015 Recommended yearly ophthalmology/optometry visit for glaucoma screening and checkup Recommended yearly dental visit for hygiene and checkup  Vaccinations: Influenza vaccine: 03/2018 Pneumococcal vaccine: 09/2015 Tdap vaccine: 09/2015 Shingles vaccine: discussed    Advanced directives: Advance directive discussed with you today.  Conditions/risks identified: overweight  Next appointment: 01/10/2019 at 12:15  Preventive Care 7 Years and Older, Male Preventive care refers to lifestyle choices and visits with your health care provider that can promote health and wellness. What does preventive care include?  A yearly physical exam. This is also called an annual well check.  Dental exams once or twice a year.  Routine eye exams. Ask your health care provider how often you should have your eyes checked.  Personal lifestyle choices, including:  Daily care of your teeth and gums.  Regular physical activity.  Eating a healthy diet.  Avoiding tobacco and drug use.  Limiting alcohol use.  Practicing safe sex.  Taking low doses of aspirin every day.  Taking vitamin and mineral supplements as recommended by your health care provider. What happens during an annual well check? The services and screenings done by your health care provider during your annual well check will depend on your age, overall health, lifestyle risk factors, and family history of disease. Counseling  Your health care provider may ask you questions about your:  Alcohol use.  Tobacco use.  Drug use.  Emotional well-being.  Home and relationship well-being.  Sexual activity.  Eating habits.  History of falls.   Memory and ability to understand (cognition).  Work and work Statistician. Screening  You may have the following tests or measurements:  Height, weight, and BMI.  Blood pressure.  Lipid and cholesterol levels. These may be checked every 5 years, or more frequently if you are over 59 years old.  Skin check.  Lung cancer screening. You may have this screening every year starting at age 5 if you have a 30-pack-year history of smoking and currently smoke or have quit within the past 15 years.  Fecal occult blood test (FOBT) of the stool. You may have this test every year starting at age 36.  Flexible sigmoidoscopy or colonoscopy. You may have a sigmoidoscopy every 5 years or a colonoscopy every 10 years starting at age 65.  Prostate cancer screening. Recommendations will vary depending on your family history and other risks.  Hepatitis C blood test.  Hepatitis B blood test.  Sexually transmitted disease (STD) testing.  Diabetes screening. This is done by checking your blood sugar (glucose) after you have not eaten for a while (fasting). You may have this done every 1-3 years.  Abdominal aortic aneurysm (AAA) screening. You may need this if you are a current or former smoker.  Osteoporosis. You may be screened starting at age 53 if you are at high risk. Talk with your health care provider about your test results, treatment options, and if necessary, the need for more tests. Vaccines  Your health care provider may recommend certain vaccines, such as:  Influenza vaccine. This is recommended every year.  Tetanus, diphtheria, and acellular pertussis (Tdap, Td) vaccine. You may need a Td booster every 10 years.  Zoster vaccine. You may need this after age 36.  Pneumococcal 13-valent conjugate (PCV13) vaccine. One dose is recommended after age 21.  Pneumococcal polysaccharide (PPSV23) vaccine. One dose is recommended after age 71. Talk to your health care provider about which  screenings and vaccines you need and how often you need them. This information is not intended to replace advice given to you by your health care provider. Make sure you discuss any questions you have with your health care provider. Document Released: 06/26/2015 Document Revised: 02/17/2016 Document Reviewed: 03/31/2015 Elsevier Interactive Patient Education  2017 [place] Prevention in the Home Falls can cause injuries. They can happen to people of all ages. There are many things you can do to make your home safe and to help prevent falls. What can I do on the outside of my home?  Regularly fix the edges of walkways and driveways and fix any cracks.  Remove anything that might make you trip as you walk through a door, such as a raised step or threshold.  Trim any bushes or trees on the path to your home.  Use bright outdoor lighting.  Clear any walking paths of anything that might make someone trip, such as rocks or tools.  Regularly check to see if handrails are loose or broken. Make sure that both sides of any steps have handrails.  Any raised decks and porches should have guardrails on the edges.  Have any leaves, snow, or ice cleared regularly.  Use sand or salt on walking paths during winter.  Clean up any spills in your garage right away. This includes oil or grease spills. What can I do in the bathroom?  Use night lights.  Install grab bars by the toilet and in the tub and shower. Do not use towel bars as grab bars.  Use non-skid mats or decals in the tub or shower.  If you need to sit down in the shower, use a plastic, non-slip stool.  Keep the floor dry. Clean up any water that spills on the floor as soon as it happens.  Remove soap buildup in the tub or shower regularly.  Attach bath mats securely with double-sided non-slip rug tape.  Do not have throw rugs and other things on the floor that can make you trip. What can I do in the bedroom?  Use  night lights.  Make sure that you have a light by your bed that is easy to reach.  Do not use any sheets or blankets that are too big for your bed. They should not hang down onto the floor.  Have a firm chair that has side arms. You can use this for support while you get dressed.  Do not have throw rugs and other things on the floor that can make you trip. What can I do in the kitchen?  Clean up any spills right away.  Avoid walking on wet floors.  Keep items that you use a lot in easy-to-reach places.  If you need to reach something above you, use a strong step stool that has a grab bar.  Keep electrical cords out of the way.  Do not use floor polish or wax that makes floors slippery. If you must use wax, use non-skid floor wax.  Do not have throw rugs and other things on the floor that can make you trip. What can I do with my stairs?  Do not leave any items on the stairs.  Make sure that there are handrails on both sides of the stairs and use them.  Fix handrails that are broken or loose. Make sure that handrails are as long as the stairways.  Check any carpeting to make sure that it is firmly attached to the stairs. Fix any carpet that is loose or worn.  Avoid having throw rugs at the top or bottom of the stairs. If you do have throw rugs, attach them to the floor with carpet tape.  Make sure that you have a light switch at the top of the stairs and the bottom of the stairs. If you do not have them, ask someone to add them for you. What else can I do to help prevent falls?  Wear shoes that:  Do not have high heels.  Have rubber bottoms.  Are comfortable and fit you well.  Are closed at the toe. Do not wear sandals.  If you use a stepladder:  Make sure that it is fully opened. Do not climb a closed stepladder.  Make sure that both sides of the stepladder are locked into place.  Ask someone to hold it for you, if possible.  Clearly mark and make sure that you  can see:  Any grab bars or handrails.  First and last steps.  Where the edge of each step is.  Use tools that help you move around (mobility aids) if they are needed. These include:  Canes.  Walkers.  Scooters.  Crutches.  Turn on the lights when you go into a dark area. Replace any light bulbs as soon as they burn out.  Set up your furniture so you have a clear path. Avoid moving your furniture around.  If any of your floors are uneven, fix them.  If there are any pets around you, be aware of where they are.  Review your medicines with your doctor. Some medicines can make you feel dizzy. This can increase your chance of falling. Ask your doctor what other things that you can do to help prevent falls. This information is not intended to replace advice given to you by your health care provider. Make sure you discuss any questions you have with your health care provider. Document Released: 03/26/2009 Document Revised: 11/05/2015 Document Reviewed: 07/04/2014 Elsevier Interactive Patient Education  2017 [employer].

## 2019-01-04 NOTE — Progress Notes (Signed)
Subjective:   Austin Knapp is a 73 y.o. male who presents for Medicare Annual/Subsequent preventive examination.  This visit type was conducted due to national recommendations for restrictions regarding the COVID-19 Pandemic (e.g. social distancing). This format is felt to be most appropriate for this patient at this time. All issues noted in this document were discussed and addressed. No physical exam was performed (except for noted visual exam findings with Video Visits). This patient, Austin Knapp, has given permission to perform this visit via telephone. Vital signs may be absent or patient reported.  Patient location:  At home  Nurse location:  At home     Review of Systems:  n/a Cardiac Risk Factors include: advanced age (>56men, >41 women);hypertension;male gender     Objective:    Vitals: Ht 5\' 10"  (1.778 m) Comment: per patient  Wt 200 lb (90.7 kg) Comment: per patient  BMI 28.70 kg/m   Body mass index is 28.7 kg/m.  Advanced Directives 01/04/2019 10/06/2015 01/19/2015  Does Patient Have a Medical Advance Directive? No Yes Yes  Type of Advance Directive - [place];Living will Out of facility DNR (pink MOST or yellow form)  Does patient want to make changes to medical advance directive? - No - Patient declined -  Copy of [place] in Chart? - No - copy requested No - copy requested    Tobacco Social History   Tobacco Use  Smoking Status Former Smoker  Smokeless Tobacco Never Used     Counseling given: Not Answered   Clinical Intake:  Pre-visit preparation completed: Yes  Pain : No/denies pain     Nutritional Status: BMI 25 -29 Overweight Nutritional Risks: None Diabetes: No  How often do you need to have someone help you when you read instructions, pamphlets, or other written materials from your doctor or pharmacy?: 1 - Never What is the last grade level you completed in school?: 19 years  Interpreter Needed?:  No  Information entered by :: NAllen LPN  Past Medical History:  Diagnosis Date  . Hypertension   . Low testosterone   . Sleep apnea    Past Surgical History:  Procedure Laterality Date  . COLONOSCOPY WITH PROPOFOL N/A 01/19/2015   Procedure: COLONOSCOPY WITH PROPOFOL;  Surgeon: Manya Silvas, MD;  Location[facility] ENDOSCOPY;  Service: Endoscopy;  Laterality: N/A;  . HERNIA REPAIR Right 08/2012  . MASTOIDECTOMY    . tongue excision     04/2012  . VASECTOMY     Family History  Problem Relation Age of Onset  . Macular degeneration Mother   . Cancer Father   . AAA (abdominal aortic aneurysm) Father   . AAA (abdominal aortic aneurysm) Sister    Social History   Socioeconomic History  . Marital status: Widowed    Spouse name: Not on file  . Number of children: Not on file  . Years of education: Not on file  . Highest education level: Not on file  Occupational History  . Not on file  Social Needs  . Financial resource strain: Not hard at all  . Food insecurity    Worry: Never true    Inability: Never true  . Transportation needs    Medical: No    Non-medical: No  Tobacco Use  . Smoking status: Former Research scientist (life sciences)  . Smokeless tobacco: Never Used  Substance and Sexual Activity  . Alcohol use: Yes    Comment: occassional  . Drug use: No  .  Sexual activity: Yes  Lifestyle  . Physical activity    Days per week: 0 days    Minutes per session: 0 min  . Stress: Not at all  Relationships  . Social Herbalist on phone: Not on file    Gets together: Not on file    Attends religious service: Not on file    Active member of club or organization: Not on file    Attends meetings of clubs or organizations: Not on file    Relationship status: Not on file  Other Topics Concern  . Not on file  Social History Narrative   Patients desires CPR.   Has HPOA.    Outpatient Encounter Medications as of 01/04/2019  Medication Sig  . ANDROGEL PUMP 20.25 MG/ACT (1.62%) GEL  APPLY 4 PUMPS ONCE DAILY AS DIRECTED.  Marland Kitchen aspirin 81 MG tablet Take 81 mg by mouth daily.  . calcium carbonate (OS-CAL) 600 MG TABS Take 600 mg by mouth 2 (two) times daily with a meal.  . Cholecalciferol (VITAMIN D3) 2000 units TABS Take 1 capsule by mouth.  . doxazosin (CARDURA) 2 MG tablet Take 1 tablet by mouth daily.  Marland Kitchen DOXAZOSIN MESYLATE PO Take 1 tablet by mouth at bedtime.   . fluticasone (FLONASE) 50 MCG/ACT nasal spray instill 1 spray into each nostril once daily  . hydrochlorothiazide (MICROZIDE) 12.5 MG capsule Take 1 capsule (12.5 mg total) by mouth daily.  . Multiple Vitamin (MULTIVITAMIN) tablet Take 1 tablet by mouth daily.  . [DISCONTINUED] lisinopril-hydrochlorothiazide (PRINZIDE,ZESTORETIC) 10-12.5 MG per tablet Take 1 tablet by mouth daily.   No facility-administered encounter medications on file as of 01/04/2019.     Activities of Daily Living In your present state of health, do you have any difficulty performing the following activities: 01/04/2019  Hearing? Y  Comment has tinnitus and produces lots of ear wax  Vision? N  Difficulty concentrating or making decisions? N  Walking or climbing stairs? N  Dressing or bathing? N  Doing errands, shopping? N  Preparing Food and eating ? N  Using the Toilet? N  In the past six months, have you accidently leaked urine? N  Do you have problems with loss of bowel control? N  Managing your Medications? N  Managing your Finances? N  Housekeeping or managing your Housekeeping? N  Some recent data might be hidden    Patient Care Team: Tower, Wynelle Fanny, MD as PCP - General (Family Medicine) Loletta Parish, MD as Referring Physician (Hematology and Oncology) Enzo Montgomery, Maxwell Caul, PA-C as Consulting Physician (Physician Assistant) Karren Burly Deirdre Peer, MD as Referring Physician (Ophthalmology)   Assessment:   This is a routine wellness examination for Austin Knapp.  Exercise Activities and Dietary recommendations Current Exercise  Habits: The patient does not participate in regular exercise at present  Goals    . Weight (lb) < 200 lb (90.7 kg)     01/04/2019, wants to weigh 185-190.    . Weight < 181 lb (82.101 kg)     Target weight loss is 15 lbs. Starting 10/06/2015, I will limit intake of simple carbohydrates to 1 serving daily.  01/04/2019, Wants to continue with previous goal.       Fall Risk Fall Risk  01/04/2019 09/12/2017 10/06/2015 08/14/2014 08/14/2014  Falls in the past year? 0 No No Yes Yes  Number falls in past yr: - - - 1 1  Injury with Fall? - - - Yes Yes  Comment - - - - abrasions  Risk for fall due to : Medication side effect - - - -  Follow up Falls evaluation completed;Falls prevention discussed - - - -   Is the patient's home free of loose throw rugs in walkways, pet beds, electrical cords, etc?   yes      Grab bars in the bathroom? no      Handrails on the stairs?   yes      Adequate lighting?   yes  Timed Get Up and Go Performed: n/a  Depression Screen PHQ 2/9 Scores 01/04/2019 09/12/2017 10/06/2015 08/14/2014  PHQ - 2 Score 0 0 0 0  PHQ- 9 Score 3 2 - -    Cognitive Function MMSE - Mini Mental State Exam 01/04/2019 10/06/2015  Orientation to time 5 5  Orientation to Place 5 5  Registration 3 3  Attention/ Calculation 5 0  Recall 3 3  Language- name 2 objects 0 0  Language- repeat 0 1  Language- follow 3 step command 0 3  Language- read & follow direction 1 0  Write a sentence 0 0  Copy design 0 0  Total score 22 20   Mini Cog  Mini-Cog screen was completed. Maximum score is 22. A value of 0 denotes this part of the MMSE was not completed or the patient failed this part of the Mini-Cog screening.       Immunization History  Administered Date(s) Administered  . Influenza, High Dose Seasonal PF 02/12/2016, 03/03/2017  . Influenza-Unspecified 03/13/2014  . Pneumococcal Conjugate-13 12/17/2013  . Pneumococcal Polysaccharide-23 03/25/2010, 10/09/2015  . Tdap 10/09/2015  . Zoster  01/27/2010  . Zoster Recombinat (Shingrix) 11/08/2016, 02/06/2017    Qualifies for Shingles Vaccine? yes  Screening Tests Health Maintenance  Topic Date Due  . INFLUENZA VACCINE  01/12/2019  . COLONOSCOPY  01/18/2025  . TETANUS/TDAP  10/08/2025  . Hepatitis C Screening  Completed  . PNA vac Low Risk Adult  Completed   Cancer Screenings: Lung: Low Dose CT Chest recommended if Age 80-80 years, 30 pack-year currently smoking OR have quit w/in 15years. Patient does not qualify. Colorectal: up to date  Additional Screenings:  Hepatitis C Screening:09/2015      Plan:    Patient wants to weigh 185-190 pounds and limit simple carbohydrates.  I have personally reviewed and noted the following in the patient's chart:   . Medical and social history . Use of alcohol, tobacco or illicit drugs  . Current medications and supplements . Functional ability and status . Nutritional status . Physical activity . Advanced directives . List of other physicians . Hospitalizations, surgeries, and ER visits in previous 12 months . Vitals . Screenings to include cognitive, depression, and falls . Referrals and appointments  In addition, I have reviewed and discussed with patient certain preventive protocols, quality metrics, and best practice recommendations. A written personalized care plan for preventive services as well as general preventive health recommendations were provided to patient.     Kellie Simmering, LPN  [id number]

## 2019-01-04 NOTE — Progress Notes (Signed)
PCP notes:  Health Maintenance:  No gaps  Abnormal Screenings:  None  Patient concerns:  None  Nurse concerns:  None  Next PCP appt.: 01/10/2019 at 12:15

## 2019-01-10 ENCOUNTER — Ambulatory Visit (INDEPENDENT_AMBULATORY_CARE_PROVIDER_SITE_OTHER): Payer: Medicare Other | Admitting: Family Medicine

## 2019-01-10 ENCOUNTER — Encounter: Payer: Self-pay | Admitting: Family Medicine

## 2019-01-10 ENCOUNTER — Other Ambulatory Visit: Payer: Self-pay

## 2019-01-10 VITALS — BP 128/74 | HR 83 | Temp 98.3°F | Ht 69.0 in | Wt 198.1 lb

## 2019-01-10 DIAGNOSIS — E038 Other specified hypothyroidism: Secondary | ICD-10-CM

## 2019-01-10 DIAGNOSIS — I10 Essential (primary) hypertension: Secondary | ICD-10-CM | POA: Diagnosis not present

## 2019-01-10 DIAGNOSIS — C029 Malignant neoplasm of tongue, unspecified: Secondary | ICD-10-CM

## 2019-01-10 DIAGNOSIS — G4733 Obstructive sleep apnea (adult) (pediatric): Secondary | ICD-10-CM | POA: Diagnosis not present

## 2019-01-10 DIAGNOSIS — E039 Hypothyroidism, unspecified: Secondary | ICD-10-CM

## 2019-01-10 DIAGNOSIS — E349 Endocrine disorder, unspecified: Secondary | ICD-10-CM

## 2019-01-10 DIAGNOSIS — Z9989 Dependence on other enabling machines and devices: Secondary | ICD-10-CM

## 2019-01-10 DIAGNOSIS — Z0000 Encounter for general adult medical examination without abnormal findings: Secondary | ICD-10-CM

## 2019-01-10 DIAGNOSIS — Z125 Encounter for screening for malignant neoplasm of prostate: Secondary | ICD-10-CM

## 2019-01-10 NOTE — Assessment & Plan Note (Signed)
Encouraged more regular usage

## 2019-01-10 NOTE — Assessment & Plan Note (Signed)
Reviewed health habits including diet and exercise and skin cancer prevention Reviewed appropriate screening tests for age  Also reviewed health mt list, fam hx and immunization status , as well as social and family history    See HPI amw reviewed  Enc regular exercise and healthy diet for cholesterol  Enc flu shot in the fall  Colonoscopy due in a year  Doing well emotionally also

## 2019-01-10 NOTE — Patient Instructions (Addendum)
Try to get most of your carbohydrates from produce (with the exception of white potatoes)  Eat less bread/pasta/rice/snack foods/cereals/sweets and other items from the middle of the grocery store (processed carbs)   Get a flu shot in the fall !  For cholesterol  Avoid red meat/ fried foods/ egg yolks/ fatty breakfast meats/ butter, cheese and high fat dairy/ and shellfish    Stay active

## 2019-01-10 NOTE — Assessment & Plan Note (Signed)
No re occurrence  Continues f/u at [facility] Will send copy of labs

## 2019-01-10 NOTE — Progress Notes (Signed)
Subjective:    Patient ID: Austin Knapp, male    DOB: [birth year], 73 y.o.   MRN: [id number]  HPI Here for health maintenance exam and to review chronic medical problems   Taking care of himself  Feels well in general   Moving into a town house soon-will have a place to walk   He is making effort to stay out of public places  He is very good about mask wearing    Had amw 7/24 No gaps or concerns   Wt Readings from Last 3 Encounters:  01/10/19 198 lb 1 oz (89.8 kg)  01/04/19 200 lb (90.7 kg)  10/01/18 195 lb 2 oz (88.5 kg)  he gained weight initially with covid  Goal is to get down to 185  He is trying to give up sweets  29.25 kg/m   Eats a lot of salads  Tries to eat better  Has seen a nutritionist in the past and it was helpful   Exercise- not anything structured  Physically active- closing out 2 houses/moving furniture and renovating  Very physical   Flu shot 10/19 Had the shingrix vaccines   Colonoscopy 8/16- 5 y recall -Dr Tiffany Kocher   Prostate health Lab Results  Component Value Date   PSA 1.13 01/04/2019   PSA 1.11 09/12/2017   PSA 0.69 10/06/2015   h/o testosterone def and tx (androgel) Testosterone level is 669.88 (ref range is 300 to 890)  Feels like that is adequate for him    bp is stable today  No cp or palpitations or headaches or edema  No side effects to medicines  BP Readings from Last 3 Encounters:  01/10/19 128/74  10/01/18 (!) 138/92  06/25/18 128/76     Lab Results  Component Value Date   CREATININE 0.95 01/04/2019   BUN 24 (H) 01/04/2019   NA 140 01/04/2019   K 4.9 01/04/2019   CL 103 01/04/2019   CO2 32 01/04/2019   Lab Results  Component Value Date   ALT 23 01/04/2019   AST 22 01/04/2019   ALKPHOS 55 01/04/2019   BILITOT 0.6 01/04/2019   Lab Results  Component Value Date   WBC 4.8 01/04/2019   HGB 17.3 (H) 01/04/2019   HCT 51.5 01/04/2019   MCV 96.7 01/04/2019   PLT 160.0 01/04/2019    OSA-cpap (does not  always use it)  He sometimes forgets to take it with him  If he sleeps on his side-less snoring   H/o tongue cancer  No new developments  F/u at [place] in august   Still goes to the [place] also    Subclinical hypothyroidism Sluggish in the evenings (fine during the day)  No skin or hair changes  Lab Results  Component Value Date   TSH 5.26 (H) 01/04/2019   FT4 is 0.64 Possibly related to radiation past tx for tongue cancer   Cholesterol Lab Results  Component Value Date   CHOL 172 01/04/2019   CHOL 148 09/12/2017   CHOL 182 10/06/2015   Lab Results  Component Value Date   HDL 39.40 01/04/2019   HDL 41.40 09/12/2017   HDL 39.50 10/06/2015   Lab Results  Component Value Date   LDLCALC 113 (H) 01/04/2019   LDLCALC 91 09/12/2017   LDLCALC 117 (H) 10/06/2015   Lab Results  Component Value Date   TRIG 97.0 01/04/2019   TRIG 78.0 09/12/2017   TRIG 125.0 10/06/2015   Lab Results  Component Value Date  CHOLHDL 4 01/04/2019   CHOLHDL 4 09/12/2017   CHOLHDL 5 10/06/2015   No results found for: LDLDIRECT LDL is up  HDL is down a bit-would like to exercise more  Likes cheese  Not much red meat  Some carry out chinese food Using air fryer- less oil   Patient Active Problem List   Diagnosis Date Noted  . Routine general medical examination at a health care facility 01/10/2019  . Left hand pain 10/01/2018  . Left elbow pain 10/01/2018  . Prostate cancer screening 09/12/2017  . Family history of abdominal aortic aneurysm (AAA) 09/12/2017  . Subclinical hypothyroidism 09/12/2017  . OSA on CPAP 08/14/2014  . Tongue cancer ([place]) 11/19/2012  . Hypertension   . Testosterone deficiency    Past Medical History:  Diagnosis Date  . Hypertension   . Low testosterone   . Sleep apnea    Past Surgical History:  Procedure Laterality Date  . COLONOSCOPY WITH PROPOFOL N/A 01/19/2015   Procedure: COLONOSCOPY WITH PROPOFOL;  Surgeon: Manya Silvas, MD;  [facility]  ENDOSCOPY;  Service: Endoscopy;  Laterality: N/A;  . HERNIA REPAIR Right 08/2012  . MASTOIDECTOMY    . tongue excision     04/2012  . VASECTOMY     Social History   Tobacco Use  . Smoking status: Former Research scientist (life sciences)  . Smokeless tobacco: Never Used  Substance Use Topics  . Alcohol use: Yes    Comment: occassional  . Drug use: No   Family History  Problem Relation Age of Onset  . Macular degeneration Mother   . Cancer Father   . AAA (abdominal aortic aneurysm) Father   . AAA (abdominal aortic aneurysm) Sister    Allergies  Allergen Reactions  . Clindamycin/Lincomycin Rash  . Latex    Current Outpatient Medications on File Prior to Visit  Medication Sig Dispense Refill  . ANDROGEL PUMP 20.25 MG/ACT (1.62%) GEL APPLY 4 PUMPS ONCE DAILY AS DIRECTED. 450 g 0  . aspirin 81 MG tablet Take 81 mg by mouth daily.    . calcium carbonate (OS-CAL) 600 MG TABS Take 600 mg by mouth 2 (two) times daily with a meal.    . Cholecalciferol (VITAMIN D3) 2000 units TABS Take 1 capsule by mouth.    . doxazosin (CARDURA) 2 MG tablet Take 1 tablet by mouth daily.    Marland Kitchen DOXAZOSIN MESYLATE PO Take 1 tablet by mouth at bedtime.     . fluticasone (FLONASE) 50 MCG/ACT nasal spray instill 1 spray into each nostril once daily 16 g 1  . hydrochlorothiazide (MICROZIDE) 12.5 MG capsule Take 1 capsule (12.5 mg total) by mouth daily. 90 capsule 2  . Multiple Vitamin (MULTIVITAMIN) tablet Take 1 tablet by mouth daily.    . [DISCONTINUED] lisinopril-hydrochlorothiazide (PRINZIDE,ZESTORETIC) 10-12.5 MG per tablet Take 1 tablet by mouth daily.     No current facility-administered medications on file prior to visit.      Review of Systems  Constitutional: Negative for activity change, appetite change, fatigue, fever and unexpected weight change.  HENT: Negative for congestion, rhinorrhea, sore throat and trouble swallowing.   Eyes: Negative for pain, redness, itching and visual disturbance.  Respiratory: Negative for  cough, chest tightness, shortness of breath and wheezing.   Cardiovascular: Negative for chest pain and palpitations.  Gastrointestinal: Negative for abdominal pain, blood in stool, constipation, diarrhea and nausea.  Endocrine: Negative for cold intolerance, heat intolerance, polydipsia and polyuria.  Genitourinary: Negative for difficulty urinating, dysuria, frequency and urgency.  Musculoskeletal: Negative for arthralgias, joint swelling and myalgias.  Skin: Negative for pallor and rash.  Neurological: Negative for dizziness, tremors, weakness, numbness and headaches.  Hematological: Negative for adenopathy. Does not bruise/bleed easily.  Psychiatric/Behavioral: Negative for decreased concentration and dysphoric mood. The patient is not nervous/anxious.        Objective:   Physical Exam Constitutional:      General: He is not in acute distress.    Appearance: Normal appearance. He is well-developed and normal weight. He is not ill-appearing or diaphoretic.  HENT:     Head: Normocephalic and atraumatic.     Right Ear: Tympanic membrane, ear canal and external ear normal.     Left Ear: Tympanic membrane, ear canal and external ear normal.     Nose: Nose normal.     Mouth/Throat:     Mouth: Mucous membranes are moist.     Pharynx: Oropharynx is clear. No posterior oropharyngeal erythema.     Comments: Baseline post surgical changes Eyes:     General: No scleral icterus.       Right eye: No discharge.        Left eye: No discharge.     Conjunctiva/sclera: Conjunctivae normal.     Pupils: Pupils are equal, round, and reactive to light.  Neck:     Musculoskeletal: Normal range of motion and neck supple. No neck rigidity or muscular tenderness.     Thyroid: No thyromegaly.     Vascular: No carotid bruit or JVD.     Comments: No goiter No palpable LN Cardiovascular:     Rate and Rhythm: Normal rate and regular rhythm.     Pulses: Normal pulses.     Heart sounds: Normal heart  sounds. No gallop.   Pulmonary:     Effort: Pulmonary effort is normal. No respiratory distress.     Breath sounds: Normal breath sounds. No wheezing or rales.     Comments: Good air exch Chest:     Chest wall: No tenderness.  Abdominal:     General: Bowel sounds are normal. There is no distension or abdominal bruit.     Palpations: Abdomen is soft. There is no mass.     Tenderness: There is no abdominal tenderness. There is no right CVA tenderness or left CVA tenderness.  Musculoskeletal:        General: No tenderness.     Right lower leg: No edema.     Left lower leg: No edema.  Lymphadenopathy:     Cervical: No cervical adenopathy.  Skin:    General: Skin is warm and dry.     Coloration: Skin is not pale.     Findings: No erythema or rash.     Comments: Solar lentigines diffusely  Few small angiomas   Neurological:     Mental Status: He is alert.     Cranial Nerves: No cranial nerve deficit.     Motor: No abnormal muscle tone.     Coordination: Coordination normal.     Gait: Gait normal.     Deep Tendon Reflexes: Reflexes are normal and symmetric. Reflexes normal.  Psychiatric:        Mood and Affect: Mood normal.        Cognition and Memory: Cognition and memory normal.     Comments: Pleasant            Assessment & Plan:   Problem List Items Addressed This Visit      Cardiovascular and Mediastinum  Hypertension     Respiratory   OSA on CPAP    Encouraged more regular usage         Digestive   Tongue cancer ([place])    No re occurrence  Continues f/u at [facility] Will send copy of labs        Endocrine   Subclinical hypothyroidism    Not symptomatic  Lab Results  Component Value Date   TSH 5.26 (H) 01/04/2019    Free T4 0.64 -still in low normal range         Other   Testosterone deficiency    Continues androgel  Level is 669.88 -in normal range  Clinically doing well  Hb is elevated at 17.3 - if this goes up more he may need to cut back on  testosterone dose       Prostate cancer screening    Lab Results  Component Value Date   PSA 1.13 01/04/2019   PSA 1.11 09/12/2017   PSA 0.69 10/06/2015   No clinical changes        Routine general medical examination at a health care facility - Primary    Reviewed health habits including diet and exercise and skin cancer prevention Reviewed appropriate screening tests for age  Also reviewed health mt list, fam hx and immunization status , as well as social and family history    See HPI amw reviewed  Enc regular exercise and healthy diet for cholesterol  Enc flu shot in the fall  Colonoscopy due in a year  Doing well emotionally also

## 2019-01-10 NOTE — Assessment & Plan Note (Signed)
Lab Results  Component Value Date   PSA 1.13 01/04/2019   PSA 1.11 09/12/2017   PSA 0.69 10/06/2015   No clinical changes

## 2019-01-10 NOTE — Assessment & Plan Note (Signed)
Continues androgel  Level is 669.88 -in normal range  Clinically doing well  Hb is elevated at 17.3 - if this goes up more he may need to cut back on testosterone dose

## 2019-01-10 NOTE — Assessment & Plan Note (Signed)
Not symptomatic  Lab Results  Component Value Date   TSH 5.26 (H) 01/04/2019    Free T4 0.64 -still in low normal range

## 2019-09-19 IMAGING — DX LEFT HAND - COMPLETE 3+ VIEW
3 series · 3 of 3 positions shown · non-contrast
Comparison: None.

CLINICAL DATA: Pain in third and fourth fingers with flexing. Mild
swelling.

EXAM:
LEFT HAND - COMPLETE 3+ VIEW

[hand ap]
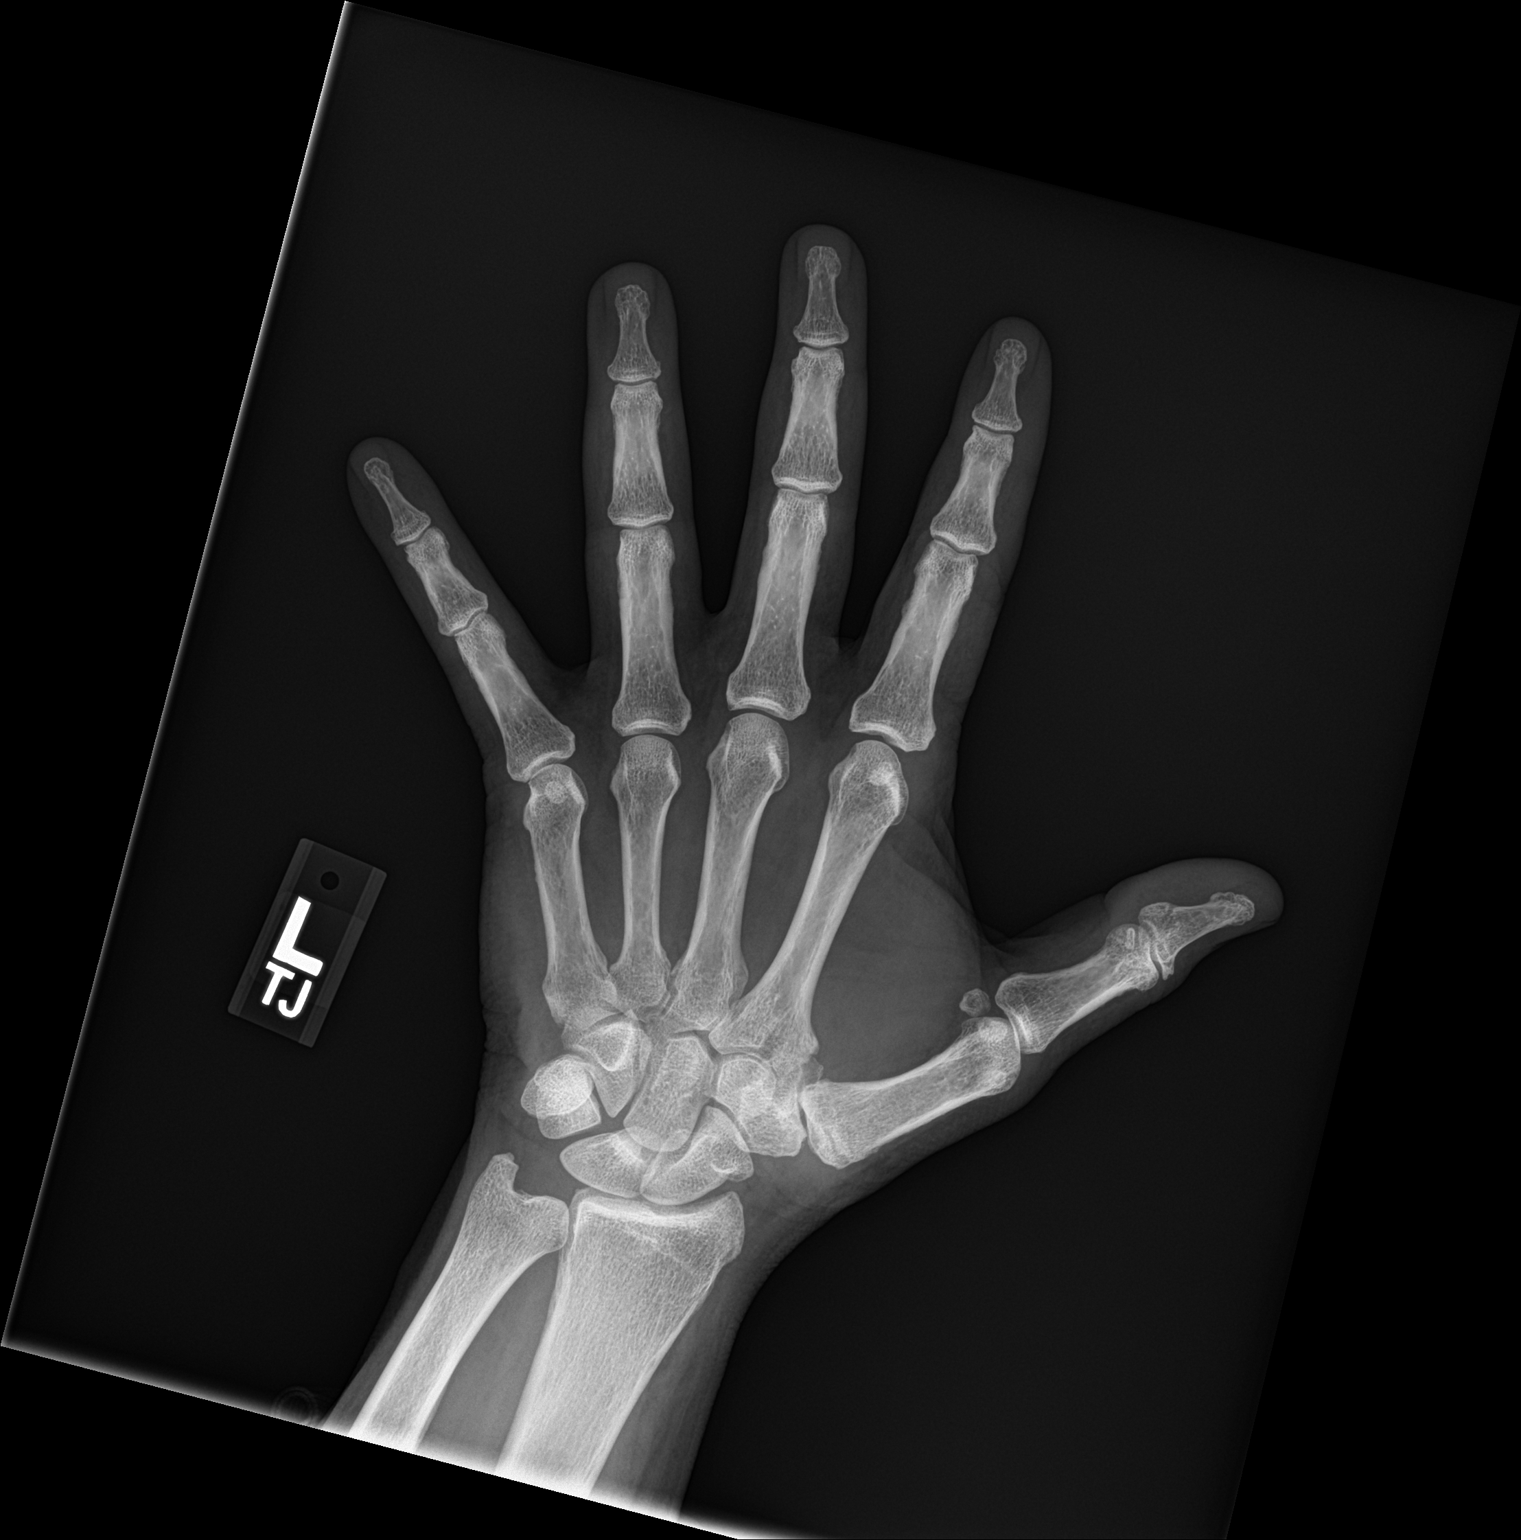

[hand obl]
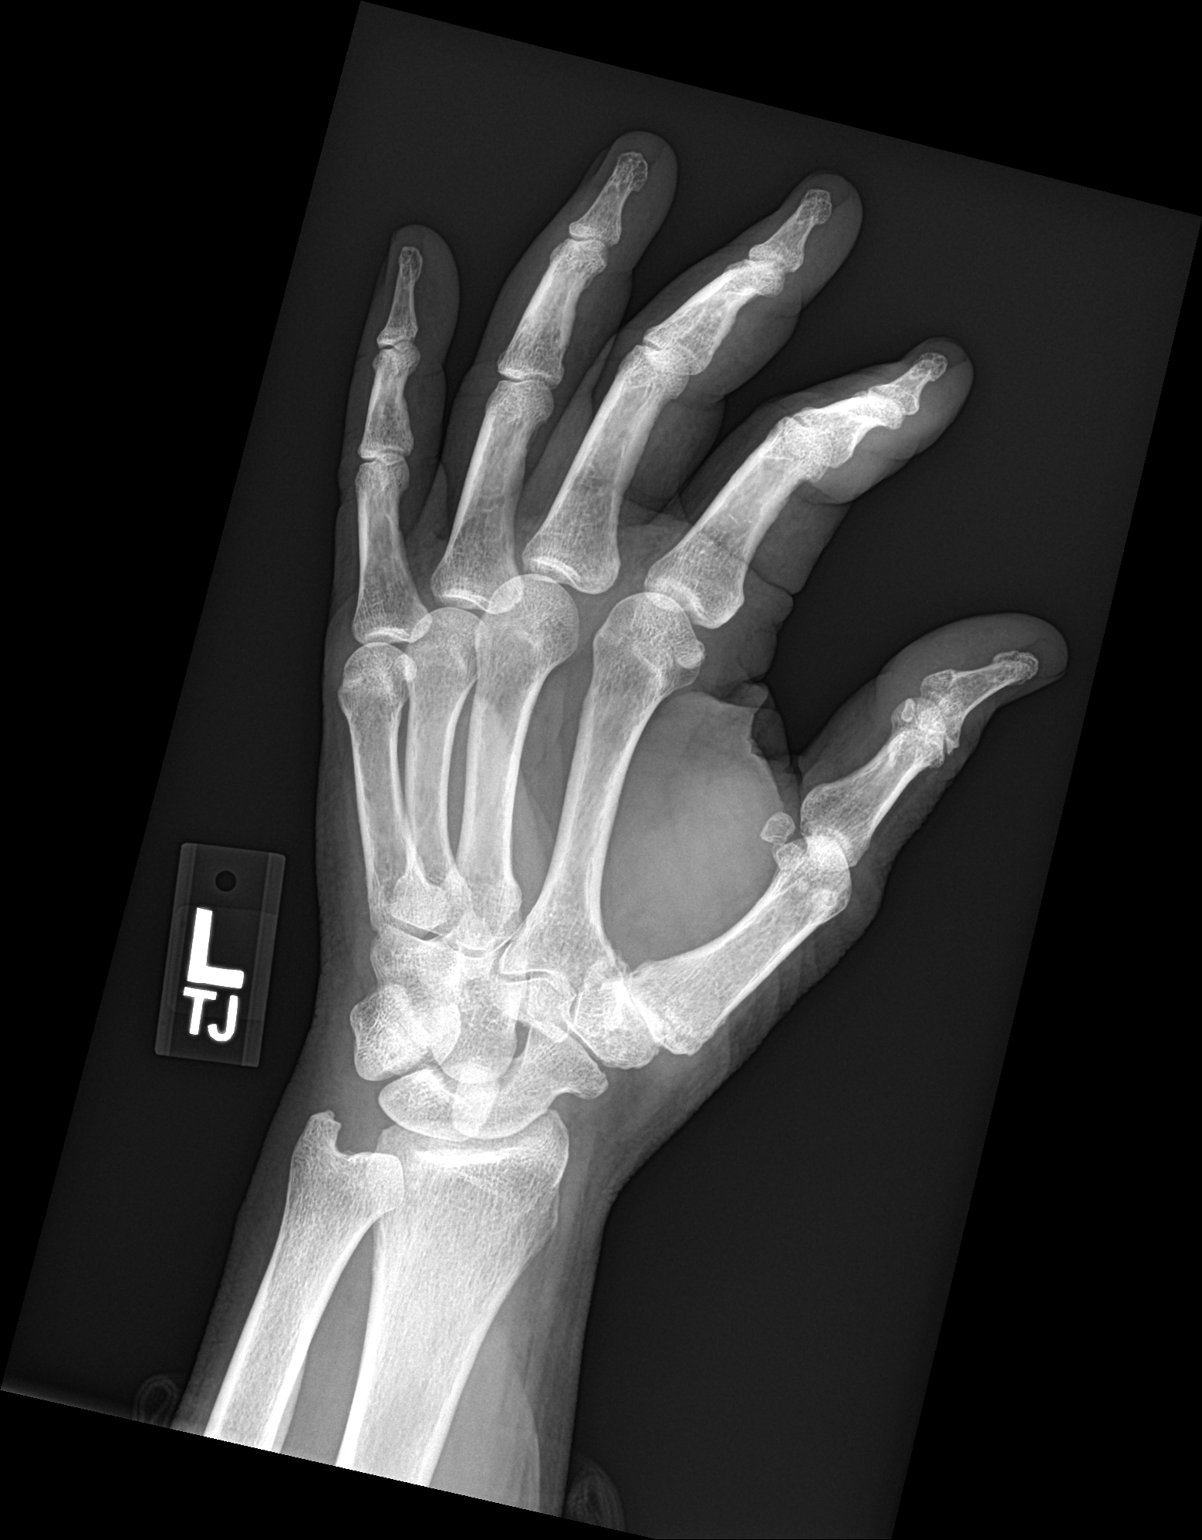

[hand lat]
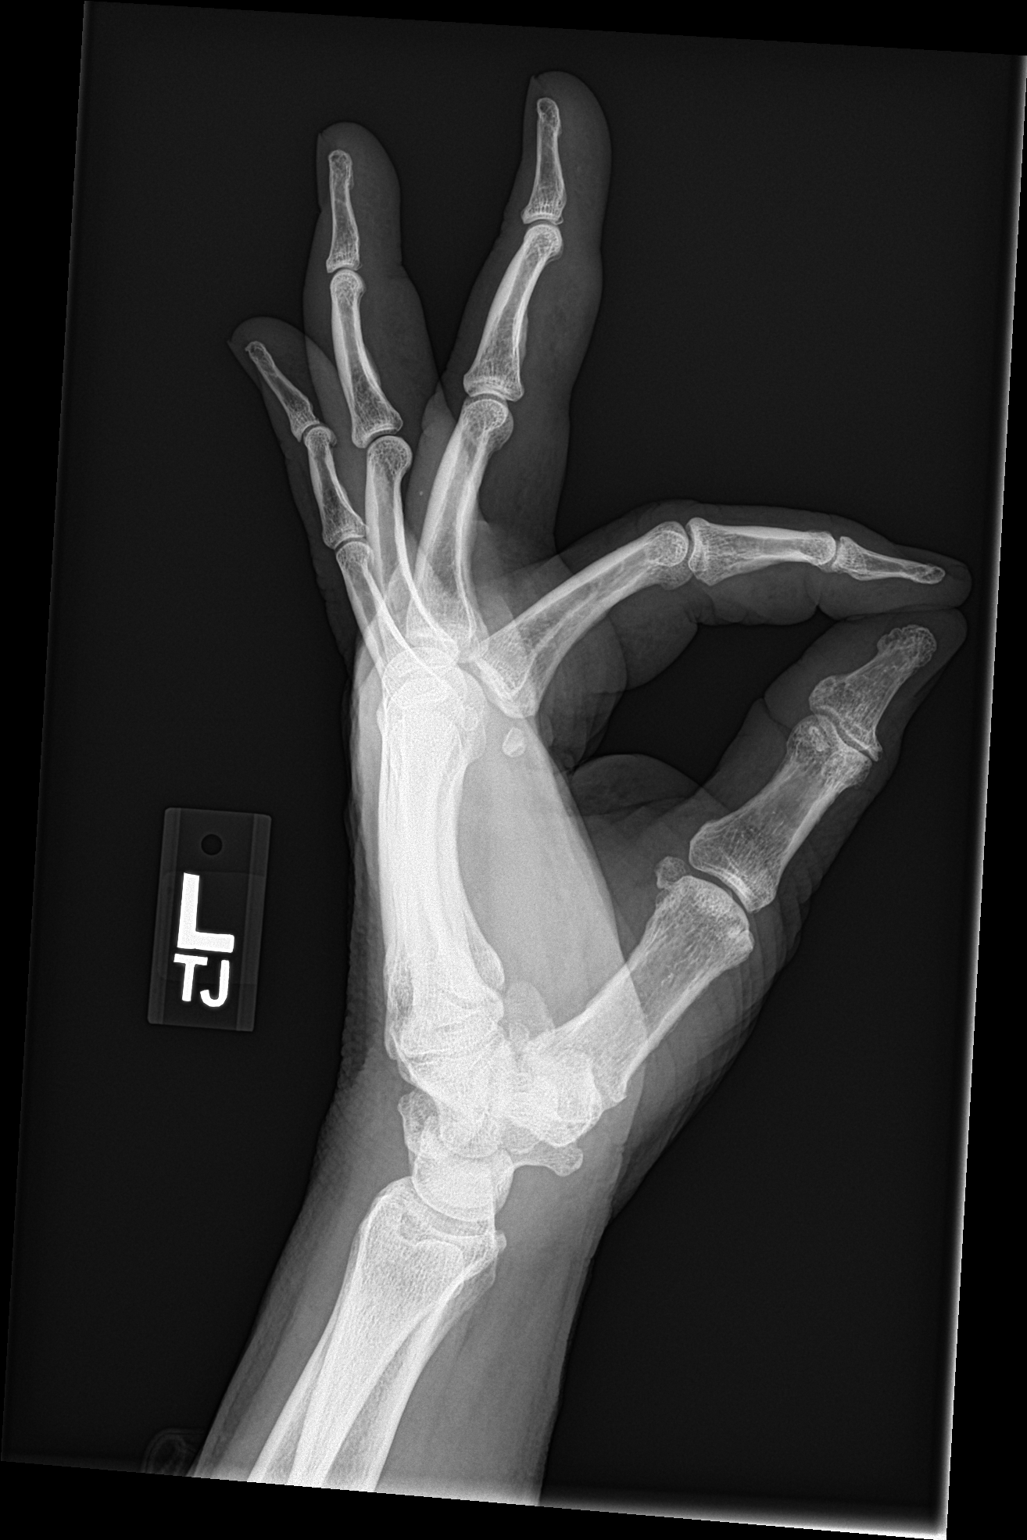

[3 of 3 positions shown; findings below may reference images not displayed]

FINDINGS: Mild degenerative changes in the first interphalangeal joint. No
cause for the patient's symptoms identified.
IMPRESSION: Mild degenerative changes in the first interphalangeal joint. No
cause for the patient's symptoms in the third or fourth digits
identified.

## 2020-02-12 ENCOUNTER — Other Ambulatory Visit: Payer: Self-pay

## 2020-02-12 ENCOUNTER — Ambulatory Visit: Payer: Medicare PPO | Admitting: Dermatology

## 2020-02-12 DIAGNOSIS — D1800 Hemangioma unspecified site: Secondary | ICD-10-CM

## 2020-02-12 DIAGNOSIS — L820 Inflamed seborrheic keratosis: Secondary | ICD-10-CM | POA: Diagnosis not present

## 2020-02-12 DIAGNOSIS — L738 Other specified follicular disorders: Secondary | ICD-10-CM | POA: Diagnosis not present

## 2020-02-12 DIAGNOSIS — L905 Scar conditions and fibrosis of skin: Secondary | ICD-10-CM

## 2020-02-12 DIAGNOSIS — L821 Other seborrheic keratosis: Secondary | ICD-10-CM

## 2020-02-12 DIAGNOSIS — L719 Rosacea, unspecified: Secondary | ICD-10-CM | POA: Diagnosis not present

## 2020-02-12 DIAGNOSIS — Z1283 Encounter for screening for malignant neoplasm of skin: Secondary | ICD-10-CM

## 2020-02-12 DIAGNOSIS — L814 Other melanin hyperpigmentation: Secondary | ICD-10-CM

## 2020-02-12 DIAGNOSIS — L578 Other skin changes due to chronic exposure to nonionizing radiation: Secondary | ICD-10-CM

## 2020-02-12 DIAGNOSIS — D229 Melanocytic nevi, unspecified: Secondary | ICD-10-CM

## 2020-02-12 NOTE — Progress Notes (Signed)
Follow-Up Visit   Subjective  Austin Knapp is a 74 y.o. male who presents for the following: full body skin exam and skin cancer screening  Patient presents today for full body skin exam and skin cancer screening. He has a few areas of concern on face. Patient has no h/o skin cancer  The following portions of the chart were reviewed this encounter and updated as appropriate:  Tobacco  Allergies  Meds  Problems  Med Hx  Surg Hx  Fam Hx      Review of Systems:  No other skin or systemic complaints except as noted in HPI or Assessment and Plan.  Objective  Well appearing patient in no apparent distress; mood and affect are within normal limits.  A full examination was performed including scalp, head, eyes, ears, nose, lips, neck, chest, axillae, abdomen, back, buttocks, bilateral upper extremities, bilateral lower extremities, hands, feet, fingers, toes, fingernails, and toenails. All findings within normal limits unless otherwise noted below.  Objective  Left Preauricular Area, Right Temple: Erythematous keratotic or waxy stuck-on papule or plaque.   Objective  Right Tip of Nose: Small yellow papules with a central dell.   Objective  Mid Supratip of Nose: Mid face erythema with telangiectasias +/- scattered inflammatory papules.   Objective  Right Upper Back: Dyspigmented smooth macule or patch.    Assessment & Plan  Inflamed seborrheic keratosis (2) Right Temple; Left Preauricular Area  Cryotherapy today Prior to procedure, discussed risks of blister formation, small wound, skin dyspigmentation, or rare scar following cryotherapy.    Recommend the perfect dermapeel  Destruction of lesion - Left Preauricular Area, Right Temple  Destruction method: cryotherapy   Informed consent: discussed and consent obtained   Lesion destroyed using liquid nitrogen: Yes   Outcome: patient tolerated procedure well with no complications   Post-procedure details: wound care  instructions given    Sebaceous hyperplasia Right Tip of Nose  Benign-appearing.  Observation.  Call clinic for new or changing lesions.  Recommend daily use of broad spectrum spf 30+ sunscreen to sun-exposed areas.     Rosacea Mid Supratip of Nose  Reviewed chronic nature. Patient defers treatment today.  Denies ocular symptoms.   Recommend daily broad spectrum sunscreen SPF 30+ to sun-exposed areas, reapply every 2 hours as needed. Call for new or changing lesions.    Scar Right Upper Back  Benign-appearing.  Observation.  Call clinic for new or changing lesions.  Recommend daily use of broad spectrum spf 30+ sunscreen to sun-exposed areas.     Lentigines - Scattered tan macules - Discussed due to sun exposure - Benign, observe - Call for any changes  Seborrheic Keratoses - Stuck-on, waxy, tan-brown papules and plaques  - Discussed benign etiology and prognosis. - Observe - Call for any changes  Melanocytic Nevi - Tan-brown and/or pink-flesh-colored symmetric macules and papules - Benign appearing on exam today - Observation - Call clinic for new or changing moles - Recommend daily use of broad spectrum spf 30+ sunscreen to sun-exposed areas.   Hemangiomas - Red papules - Discussed benign nature - Observe - Call for any changes  Actinic Damage - diffuse scaly erythematous macules with underlying dyspigmentation - Recommend daily broad spectrum sunscreen SPF 30+ to sun-exposed areas, reapply every 2 hours as needed.  - Call for new or changing lesions.  Skin cancer screening performed today.   Return in about 1 year (around 02/11/2021) for TBSE.  I, Donzetta Kohut, CMA, am acting as scribe for  Forest Gleason, MD .   Documentation: I have reviewed the above documentation for accuracy and completeness, and I agree with the above.  Forest Gleason, MD

## 2020-02-12 NOTE — Patient Instructions (Addendum)

## 2020-02-24 ENCOUNTER — Ambulatory Visit (INDEPENDENT_AMBULATORY_CARE_PROVIDER_SITE_OTHER): Payer: Self-pay

## 2020-02-24 ENCOUNTER — Other Ambulatory Visit: Payer: Self-pay

## 2020-02-24 DIAGNOSIS — L578 Other skin changes due to chronic exposure to nonionizing radiation: Secondary | ICD-10-CM

## 2020-02-24 DIAGNOSIS — I781 Nevus, non-neoplastic: Secondary | ICD-10-CM

## 2020-02-24 DIAGNOSIS — L821 Other seborrheic keratosis: Secondary | ICD-10-CM

## 2020-02-24 NOTE — Progress Notes (Signed)
Pt presents today for a cosmetic consultation. CC: Flat SK's, telangectasias and sundamaged skin. Recommended  A series of BBL spaced by 4-6 weeks apart. He has no hx of fever blisters. He knows to stay out of the sun at least 4 weeks prior to his appointment. Risks and benefits reviewed.

## 2020-02-25 ENCOUNTER — Encounter: Payer: Self-pay | Admitting: Dermatology

## 2020-02-26 ENCOUNTER — Other Ambulatory Visit: Payer: Self-pay

## 2020-02-26 ENCOUNTER — Other Ambulatory Visit: Payer: Medicare PPO

## 2020-02-26 DIAGNOSIS — Z20822 Contact with and (suspected) exposure to covid-19: Secondary | ICD-10-CM

## 2020-02-27 LAB — NOVEL CORONAVIRUS, NAA: SARS-CoV-2, NAA: NOT DETECTED

## 2020-02-27 LAB — SARS-COV-2, NAA 2 DAY TAT

## 2020-03-02 ENCOUNTER — Other Ambulatory Visit: Payer: Self-pay | Admitting: Critical Care Medicine

## 2020-03-02 ENCOUNTER — Other Ambulatory Visit: Payer: Medicare PPO

## 2020-03-02 DIAGNOSIS — Z20822 Contact with and (suspected) exposure to covid-19: Secondary | ICD-10-CM

## 2020-03-04 LAB — NOVEL CORONAVIRUS, NAA: SARS-CoV-2, NAA: DETECTED — AB

## 2020-03-04 LAB — SARS-COV-2, NAA 2 DAY TAT

## 2020-03-05 ENCOUNTER — Telehealth: Payer: Self-pay | Admitting: Physician Assistant

## 2020-03-05 NOTE — Telephone Encounter (Signed)
Called to Discuss with patient about Covid symptoms and the use of the monoclonal antibody infusion for those with mild to moderate Covid symptoms and at a high risk of hospitalization.     Pt appears to qualify for this infusion due to co-morbid conditions and/or a member of an at-risk group in accordance with the FDA Emergency Use Authorization.    I spoke with the patient and he is considering MAB. Symptoms started on 02/26/20, although he had a negative COVID test that day. Positive test on 03/02/20. He will call the hotline if he wants to schedule.    Tami Lin Jayla Mackie, PA-C 03/05/2020, 2:15 PM

## 2020-03-11 ENCOUNTER — Other Ambulatory Visit: Payer: Medicare PPO

## 2020-03-11 DIAGNOSIS — Z20822 Contact with and (suspected) exposure to covid-19: Secondary | ICD-10-CM

## 2020-03-12 LAB — NOVEL CORONAVIRUS, NAA: SARS-CoV-2, NAA: DETECTED — AB

## 2020-03-12 LAB — SARS-COV-2, NAA 2 DAY TAT

## 2020-03-16 ENCOUNTER — Other Ambulatory Visit: Payer: Self-pay

## 2020-03-16 ENCOUNTER — Ambulatory Visit (INDEPENDENT_AMBULATORY_CARE_PROVIDER_SITE_OTHER): Payer: Self-pay

## 2020-03-16 DIAGNOSIS — L821 Other seborrheic keratosis: Secondary | ICD-10-CM

## 2020-03-16 DIAGNOSIS — I781 Nevus, non-neoplastic: Secondary | ICD-10-CM

## 2020-03-16 DIAGNOSIS — L578 Other skin changes due to chronic exposure to nonionizing radiation: Secondary | ICD-10-CM

## 2020-05-11 ENCOUNTER — Ambulatory Visit (INDEPENDENT_AMBULATORY_CARE_PROVIDER_SITE_OTHER): Payer: Self-pay

## 2020-05-11 ENCOUNTER — Other Ambulatory Visit: Payer: Self-pay

## 2020-05-11 DIAGNOSIS — L719 Rosacea, unspecified: Secondary | ICD-10-CM

## 2020-05-11 DIAGNOSIS — L821 Other seborrheic keratosis: Secondary | ICD-10-CM

## 2020-05-11 DIAGNOSIS — I781 Nevus, non-neoplastic: Secondary | ICD-10-CM

## 2020-05-11 DIAGNOSIS — L578 Other skin changes due to chronic exposure to nonionizing radiation: Secondary | ICD-10-CM

## 2020-07-20 LAB — HM COLONOSCOPY

## 2020-09-24 ENCOUNTER — Ambulatory Visit: Payer: Non-veteran care | Attending: Neurology

## 2020-10-05 ENCOUNTER — Encounter: Payer: Self-pay | Admitting: Family Medicine

## 2020-10-05 ENCOUNTER — Ambulatory Visit: Payer: No Typology Code available for payment source | Attending: Neurology

## 2020-10-05 DIAGNOSIS — G4733 Obstructive sleep apnea (adult) (pediatric): Secondary | ICD-10-CM | POA: Diagnosis not present

## 2020-10-05 DIAGNOSIS — G4737 Central sleep apnea in conditions classified elsewhere: Secondary | ICD-10-CM | POA: Insufficient documentation

## 2020-10-06 ENCOUNTER — Other Ambulatory Visit: Payer: Self-pay

## 2021-02-18 ENCOUNTER — Encounter: Payer: Medicare PPO | Admitting: Dermatology

## 2021-03-02 ENCOUNTER — Ambulatory Visit: Payer: No Typology Code available for payment source | Admitting: Dermatology

## 2021-03-02 ENCOUNTER — Encounter: Payer: Self-pay | Admitting: Dermatology

## 2021-03-02 ENCOUNTER — Other Ambulatory Visit: Payer: Self-pay

## 2021-03-02 DIAGNOSIS — L821 Other seborrheic keratosis: Secondary | ICD-10-CM

## 2021-03-02 DIAGNOSIS — L820 Inflamed seborrheic keratosis: Secondary | ICD-10-CM

## 2021-03-02 DIAGNOSIS — D229 Melanocytic nevi, unspecified: Secondary | ICD-10-CM

## 2021-03-02 DIAGNOSIS — D1800 Hemangioma unspecified site: Secondary | ICD-10-CM

## 2021-03-02 DIAGNOSIS — L814 Other melanin hyperpigmentation: Secondary | ICD-10-CM

## 2021-03-02 DIAGNOSIS — L578 Other skin changes due to chronic exposure to nonionizing radiation: Secondary | ICD-10-CM | POA: Diagnosis not present

## 2021-03-02 DIAGNOSIS — Z1283 Encounter for screening for malignant neoplasm of skin: Secondary | ICD-10-CM

## 2021-03-02 NOTE — Patient Instructions (Addendum)
Cryotherapy Aftercare  Wash gently with soap and water everyday.   Apply Vaseline and Band-Aid daily until healed.   Prior to procedure, discussed risks of blister formation, small wound, skin dyspigmentation, or rare scar following cryotherapy. Recommend Vaseline ointment to treated areas while healing.    Melanoma ABCDEs  Melanoma is the most dangerous type of skin cancer, and is the leading cause of death from skin disease.  You are more likely to develop melanoma if you: Have light-colored skin, light-colored eyes, or red or blond hair Spend a lot of time in the sun Tan regularly, either outdoors or in a tanning bed Have had blistering sunburns, especially during childhood Have a close family member who has had a melanoma Have atypical moles or large birthmarks  Early detection of melanoma is key since treatment is typically straightforward and cure rates are extremely high if we catch it early.   The first sign of melanoma is often a change in a mole or a new dark spot.  The ABCDE system is a way of remembering the signs of melanoma.  A for asymmetry:  The two halves do not match. B for border:  The edges of the growth are irregular. C for color:  A mixture of colors are present instead of an even brown color. D for diameter:  Melanomas are usually (but not always) greater than 75mm - the size of a pencil eraser. E for evolution:  The spot keeps changing in size, shape, and color.  Please check your skin once per month between visits. You can use a small mirror in front and a large mirror behind you to keep an eye on the back side or your body.   If you see any new or changing lesions before your next follow-up, please call to schedule a visit.  Please continue daily skin protection including broad spectrum sunscreen SPF 30+ to sun-exposed areas, reapplying every 2 hours as needed when you're outdoors.    Recommend taking Heliocare sun protection supplement daily in sunny  weather for additional sun protection. For maximum protection on the sunniest days, you can take up to 2 capsules of regular Heliocare OR take 1 capsule of Heliocare Ultra. For prolonged exposure (such as a full day in the sun), you can repeat your dose of the supplement 4 hours after your first dose. Heliocare can be purchased at [facility] or at [email or web address].    If you have any questions or concerns for your doctor, please call our main line at [phone] and press option 4 to reach your doctor's medical assistant. If no one answers, please leave a voicemail as directed and we will return your call as soon as possible. Messages left after 4 pm will be answered the following business day.   You may also send [place] a message via [place]. We typically respond to MyChart messages within 1-2 business days.  For prescription refills, please ask your pharmacy to contact our office. Our fax number is [phone].  If you have an urgent issue when the clinic is closed that cannot wait until the next business day, you can page your doctor at the number below.    Please note that while we do our best to be available for urgent issues outside of office hours, we are not available 24/7.   If you have an urgent issue and are unable to reach [place], you may choose to seek medical care at your doctor's office, retail clinic, urgent care center, or  emergency room.  If you have a medical emergency, please immediately call 911 or go to the emergency department.  Pager Numbers  - Dr. Nehemiah Massed: [phone]  - Dr. Laurence Ferrari: [phone]  - Dr. Nicole Kindred: [phone]  In the event of inclement weather, please call our main line at [phone] for an update on the status of any delays or closures.  Dermatology Medication Tips: Please keep the boxes that topical medications come in in order to help keep track of the instructions about where and how to use these. Pharmacies typically print the medication  instructions only on the boxes and not directly on the medication tubes.   If your medication is too expensive, please contact our office at [phone] option 4 or send [place] a message through [place].   We are unable to tell what your co-pay for medications will be in advance as this is different depending on your insurance coverage. However, we may be able to find a substitute medication at lower cost or fill out paperwork to get insurance to cover a needed medication.   If a prior authorization is required to get your medication covered by your insurance company, please allow [place] 1-2 business days to complete this process.  Drug prices often vary depending on where the prescription is filled and some pharmacies may offer cheaper prices.  The website www.goodrx.com contains coupons for medications through different pharmacies. The prices here do not account for what the cost may be with help from insurance (it may be cheaper with your insurance), but the website can give you the price if you did not use any insurance.  - You can print the associated coupon and take it with your prescription to the pharmacy.  - You may also stop by our office during regular business hours and pick up a GoodRx coupon card.  - If you need your prescription sent electronically to a different pharmacy, notify our office through [facility] or by phone at [phone] option 4.

## 2021-03-02 NOTE — Progress Notes (Signed)
   Follow-Up Visit   Subjective  Austin Knapp is a 75 y.o. male who presents for the following: FBSE (Patient here for full body skin exam and skin cancer screening. Patient does not have a hx of skin cancer or atypical nevi. He does have a spot at middle of back that feels like it is getting larger and a spot at left elbow that are both itchy).  The following portions of the chart were reviewed this encounter and updated as appropriate:   Tobacco  Allergies  Meds  Problems  Med Hx  Surg Hx  Fam Hx      Review of Systems:  No other skin or systemic complaints except as noted in HPI or Assessment and Plan.  Objective  Well appearing patient in no apparent distress; mood and affect are within normal limits.  A full examination was performed including scalp, head, eyes, ears, nose, lips, neck, chest, axillae, abdomen, back, buttocks, bilateral upper extremities, bilateral lower extremities, hands, feet, fingers, toes, fingernails, and toenails. All findings within normal limits unless otherwise noted below.  Left forearm at elbow x 1, mid back x 1 Erythematous keratotic or waxy stuck-on papule or plaque.    Assessment & Plan  Inflamed seborrheic keratosis Left forearm at elbow x 1, mid back x 1  Prior to procedure, discussed risks of blister formation, small wound, skin dyspigmentation, or rare scar following cryotherapy. Recommend Vaseline ointment to treated areas while healing.   Destruction of lesion - Left forearm at elbow x 1, mid back x 1  Destruction method: cryotherapy   Informed consent: discussed and consent obtained   Lesion destroyed using liquid nitrogen: Yes   Cryotherapy cycles:  2 Outcome: patient tolerated procedure well with no complications   Post-procedure details: wound care instructions given    Lentigines - Scattered tan macules - Due to sun exposure - Benign-appearing, observe - Recommend daily broad spectrum sunscreen SPF 30+ to sun-exposed areas,  reapply every 2 hours as needed. - Call for any changes  Seborrheic Keratoses - Stuck-on, waxy, tan-brown papules and/or plaques  - Benign-appearing - Discussed benign etiology and prognosis. - Observe - Call for any changes  Melanocytic Nevi - Tan-brown and/or pink-flesh-colored symmetric macules and papules - Benign appearing on exam today - Observation - Call clinic for new or changing moles - Recommend daily use of broad spectrum spf 30+ sunscreen to sun-exposed areas.   Hemangiomas - Red papules - Discussed benign nature - Observe - Call for any changes  Actinic Damage - Chronic condition, secondary to cumulative UV/sun exposure - diffuse scaly erythematous macules with underlying dyspigmentation - Recommend daily broad spectrum sunscreen SPF 30+ to sun-exposed areas, reapply every 2 hours as needed.  - Staying in the shade or wearing long sleeves, sun glasses (UVA+UVB protection) and wide brim hats (4-inch brim around the entire circumference of the hat) are also recommended for sun protection.  - Call for new or changing lesions.  Skin cancer screening performed today.  Return today (on 03/02/2021) for 1-2 years for FBSE.  Graciella Belton, RMA, am acting as scribe for Forest Gleason, MD .  Documentation: I have reviewed the above documentation for accuracy and completeness, and I agree with the above.  Forest Gleason, MD

## 2021-10-06 ENCOUNTER — Encounter: Payer: Self-pay | Admitting: Dermatology

## 2021-10-06 ENCOUNTER — Ambulatory Visit (INDEPENDENT_AMBULATORY_CARE_PROVIDER_SITE_OTHER): Payer: Medicare PPO | Admitting: Dermatology

## 2021-10-06 DIAGNOSIS — R238 Other skin changes: Secondary | ICD-10-CM | POA: Diagnosis not present

## 2021-10-06 DIAGNOSIS — D1800 Hemangioma unspecified site: Secondary | ICD-10-CM | POA: Diagnosis not present

## 2021-10-06 DIAGNOSIS — L578 Other skin changes due to chronic exposure to nonionizing radiation: Secondary | ICD-10-CM

## 2021-10-06 DIAGNOSIS — L821 Other seborrheic keratosis: Secondary | ICD-10-CM

## 2021-10-06 MED ORDER — METRONIDAZOLE 0.75 % EX GEL: 1.0000 "application " | Freq: Every day | CUTANEOUS | 1 refills | Status: AC

## 2021-10-06 NOTE — Patient Instructions (Addendum)
Apply Eucrisa sample once daily. Apply second.  ? ?Apply Metronidazole gel once daily. Apply first.  ? ? ?If You Need Anything After Your Visit ? ?If you have any questions or concerns for your doctor, please call our main line at [phone] and press option 4 to reach your doctor's medical assistant. If no one answers, please leave a voicemail as directed and we will return your call as soon as possible. Messages left after 4 pm will be answered the following business day.  ? ?You may also send [place] a message via MyChart. We typically respond to MyChart messages within 1-2 business days. ? ?For prescription refills, please ask your pharmacy to contact our office. Our fax number is [phone]. ? ?If you have an urgent issue when the clinic is closed that cannot wait until the next business day, you can page your doctor at the number below.   ? ?Please note that while we do our best to be available for urgent issues outside of office hours, we are not available 24/7.  ? ?If you have an urgent issue and are unable to reach [place], you may choose to seek medical care at your doctor's office, retail clinic, urgent care center, or emergency room. ? ?If you have a medical emergency, please immediately call 911 or go to the emergency department. ? ?Pager Numbers ? ?- Dr. Nehemiah Massed: [phone] ? ?- Dr. Laurence Ferrari: [phone] ? ?- Dr. Nicole Kindred: [phone] ? ?In the event of inclement weather, please call our main line at [phone] for an update on the status of any delays or closures. ? ?Dermatology Medication Tips: ?Please keep the boxes that topical medications come in in order to help keep track of the instructions about where and how to use these. Pharmacies typically print the medication instructions only on the boxes and not directly on the medication tubes.  ? ?If your medication is too expensive, please contact our office at [phone] option 4 or send [place] a message through [place].  ? ?We are unable to tell what  your co-pay for medications will be in advance as this is different depending on your insurance coverage. However, we may be able to find a substitute medication at lower cost or fill out paperwork to get insurance to cover a needed medication.  ? ?If a prior authorization is required to get your medication covered by your insurance company, please allow [place] 1-2 business days to complete this process. ? ?Drug prices often vary depending on where the prescription is filled and some pharmacies may offer cheaper prices. ? ?The website www.goodrx.com contains coupons for medications through different pharmacies. The prices here do not account for what the cost may be with help from insurance (it may be cheaper with your insurance), but the website can give you the price if you did not use any insurance.  ?- You can print the associated coupon and take it with your prescription to the pharmacy.  ?- You may also stop by our office during regular business hours and pick up a GoodRx coupon card.  ?- If you need your prescription sent electronically to a different pharmacy, notify our office through [facility] or by phone at [phone] option 4. ? ? ? ? ?Si Usted Necesita Algo Despu?s de Su Visita ? ?Tambi?n puede enviarnos un mensaje a trav?s de MyChart. Por lo general respondemos a los mensajes de MyChart en el transcurso de 1 a 2 d?as h?biles. ? ?Para renovar recetas, por favor pida a su  farmacia que se ponga en contacto con nuestra oficina. Nuestro n?mero de fax es el [phone]. ? ?Si tiene un asunto urgente cuando la cl?nica est? cerrada y que no puede esperar hasta el siguiente d?a h?bil, puede llamar/localizar a su doctor(a) al n?mero que aparece a continuaci?n.  ? ?Por favor, tenga en cuenta que aunque hacemos todo lo posible para estar disponibles para asuntos urgentes fuera del horario de oficina, no estamos disponibles las 24 horas del d?a, los 7 d?as de la semana.  ? ?Si tiene un problema urgente y  no puede comunicarse con nosotros, puede optar por buscar atenci?n m?dica  en el consultorio de su doctor(a), en una cl?nica privada, en un centro de atenci?n urgente o en una sala de emergencias. ? ?Si tiene Engineer, maintenance (IT) m?dica, por favor llame inmediatamente al 911 o vaya a la sala de emergencias. ? ?N?meros de b?per ? ?- Dr. Nehemiah Massed: [phone] ? ?- Dra. Moye: [phone] ? ?- Dra. Nicole Kindred: [phone] ? ?En caso de inclemencias del tiempo, por favor llame a nuestra l?nea principal al [phone] para una actualizaci?n sobre el estado de cualquier retraso o cierre. ? ?Consejos para la medicaci?n en dermatolog?a: ?Por favor, guarde las cajas en las que vienen los medicamentos de uso t?pico para ayudarle a seguir las instrucciones sobre d?nde y c?mo usarlos. Las farmacias generalmente imprimen las instrucciones del medicamento s?lo en las cajas y no directamente en los tubos del [place].  ? ?Si su medicamento es muy caro, por favor, p?ngase en contacto con Zigmund Daniel llamando al [phone] y presione la opci?n 4 o env?enos un mensaje a trav?s de MyChart.  ? ?No podemos decirle cu?l ser? su copago por los medicamentos por adelantado ya que esto es diferente dependiendo de la cobertura de su seguro. Sin embargo, es posible que podamos encontrar un medicamento sustituto a Electrical engineer un formulario para que el seguro cubra el medicamento que se considera necesario.  ? ?Si se requiere Ardelia Mems autorizaci?n previa para que su compa??a de seguros [place] su medicamento, por favor perm?tanos de 1 a 2 d?as h?biles para completar este proceso. ? ?Los precios de los medicamentos var?an con frecuencia dependiendo del Environmental consultant de d?nde se surte la receta y alguna farmacias pueden ofrecer precios m?s baratos. ? ?El sitio web www.goodrx.com tiene cupones para medicamentos de Airline pilot. Los precios aqu? no tienen en cuenta lo que podr?a costar con la ayuda del seguro (puede ser m?s barato con su  seguro), pero el sitio web puede darle el precio si no utiliz? ning?n seguro.  ?- Puede imprimir el cup?n correspondiente y llevarlo con su receta a la farmacia.  ?- Tambi?n puede pasar por nuestra oficina durante el horario de atenci?n regular y recoger una tarjeta de cupones de GoodRx.  ?- Si necesita que su receta se env?e electr?nicamente a Chiropodist, informe a nuestra oficina a trav?s de MyChart de [place] o por tel?fono llamando al [phone] y presione la opci?n 4.  ?

## 2021-10-06 NOTE — Progress Notes (Signed)
? ?  Follow-Up Visit ?  ?Subjective  ?Austin Knapp is a 76 y.o. male who presents for the following: lesion (Left lower lip. Dur: 1 month. Pink, not scaly. Tender to touch. No personal H/O fever blisters. Has not changed since came up). No bleeding. ? ? ? ?The following portions of the chart were reviewed this encounter and updated as appropriate:   ?  ? ?Review of Systems: No other skin or systemic complaints except as noted in HPI or Assessment and Plan. ? ? ?Objective  ?Well appearing patient in no apparent distress; mood and affect are within normal limits. ? ?A focused examination was performed including head, including the scalp, face, neck, nose, ears, eyelids, and lips and back. Relevant physical exam findings are noted in the Assessment and Plan. ? ?Left Lower Cutaneous Lip at vermillion edge ?Erythematous papule 69m, no scale ? ? ?Assessment & Plan  ?Inflammatory papule ?Left Lower Cutaneous Lip at vermillion edge ? ?Rosacea vs other ? ? Apply Metronidazole gel once daily. Apply first.  ?Apply Eucrisa sample once daily. Apply second. ? ?RTC in 1 month for biopsy if not resolved.  ? ?metroNIDAZOLE (METROGEL) 0.75 % gel - Left Lower Cutaneous Lip at vermillion edge ?Apply 1 application. topically daily. ? ? ?Seborrheic Keratoses ?- Stuck-on, waxy, tan-brown papules and/or plaques  ?- Benign-appearing ?- Discussed benign etiology and prognosis. ?- Observe ?- Call for any changes ? ?Hemangiomas ?- Red papules ?- Discussed benign nature ?- Observe ?- Call for any changes ? ?Actinic Damage ?- chronic, secondary to cumulative UV radiation exposure/sun exposure over time ?- diffuse scaly erythematous macules with underlying dyspigmentation ?- Recommend daily broad spectrum sunscreen SPF 30+ to sun-exposed areas, reapply every 2 hours as needed.  ?- Recommend staying in the shade or wearing long sleeves, sun glasses (UVA+UVB protection) and wide brim hats (4-inch brim around the entire circumference of the hat). ?-  Call for new or changing lesions.  ? ?Return for Rosacea Follow Up in 1 month if not improved. , TBSE As Scheduled. ? ?I, JEmelia Salisbury CMA, am acting as scribe for TBrendolyn Patty MD. ? ?Documentation: I have reviewed the above documentation for accuracy and completeness, and I agree with the above. ? ?TBrendolyn PattyMD  ?

## 2021-10-27 ENCOUNTER — Ambulatory Visit: Payer: Medicare PPO | Admitting: Dermatology

## 2021-11-10 ENCOUNTER — Ambulatory Visit: Payer: Medicare PPO | Admitting: Dermatology

## 2022-03-03 ENCOUNTER — Encounter: Payer: Self-pay | Admitting: Dermatology

## 2022-03-03 ENCOUNTER — Ambulatory Visit: Payer: Medicare PPO | Admitting: Dermatology

## 2022-03-03 DIAGNOSIS — L578 Other skin changes due to chronic exposure to nonionizing radiation: Secondary | ICD-10-CM | POA: Diagnosis not present

## 2022-03-03 DIAGNOSIS — Z1283 Encounter for screening for malignant neoplasm of skin: Secondary | ICD-10-CM | POA: Diagnosis not present

## 2022-03-03 DIAGNOSIS — D1801 Hemangioma of skin and subcutaneous tissue: Secondary | ICD-10-CM

## 2022-03-03 DIAGNOSIS — L814 Other melanin hyperpigmentation: Secondary | ICD-10-CM

## 2022-03-03 DIAGNOSIS — D229 Melanocytic nevi, unspecified: Secondary | ICD-10-CM

## 2022-03-03 DIAGNOSIS — L821 Other seborrheic keratosis: Secondary | ICD-10-CM | POA: Diagnosis not present

## 2022-03-03 DIAGNOSIS — L820 Inflamed seborrheic keratosis: Secondary | ICD-10-CM | POA: Diagnosis not present

## 2022-03-03 NOTE — Progress Notes (Signed)
   Follow-Up Visit   Subjective  Austin Knapp is a 76 y.o. male who presents for the following: Annual Exam (Here for skin cancer screening. No personal Hx of skin cancer or dysplastic nevi. C/O itchy dry areas at back. Sometimes scratches these off. Daughter has H/O skin cancer). The patient presents for Total-Body Skin Exam (TBSE) for skin cancer screening and mole check.  The patient has spots, moles and lesions to be evaluated, some may be new or changing and the patient has concerns that these could be cancer.   The following portions of the chart were reviewed this encounter and updated as appropriate:  Tobacco  Allergies  Meds  Problems  Med Hx  Surg Hx  Fam Hx      Review of Systems: No other skin or systemic complaints except as noted in HPI or Assessment and Plan.   Objective  Well appearing patient in no apparent distress; mood and affect are within normal limits.  A full examination was performed including scalp, head, eyes, ears, nose, lips, neck, chest, axillae, abdomen, back, buttocks, bilateral upper extremities, bilateral lower extremities, hands, feet, fingers, toes, fingernails, and toenails. All findings within normal limits unless otherwise noted below.  Right Back x3 (3) Erythematous keratotic or waxy stuck-on papule or plaque.   Assessment & Plan   Lentigines - Scattered tan macules - Due to sun exposure - Benign-appearing, observe - Recommend daily broad spectrum sunscreen SPF 30+ to sun-exposed areas, reapply every 2 hours as needed. - Call for any changes  Seborrheic Keratoses - Stuck-on, waxy, tan-brown papules and/or plaques  - Benign-appearing - Discussed benign etiology and prognosis. - Observe - Call for any changes  Melanocytic Nevi - Tan-brown and/or pink-flesh-colored symmetric macules and papules - Benign appearing on exam today - Observation - Call clinic for new or changing moles - Recommend daily use of broad spectrum spf 30+  sunscreen to sun-exposed areas.   Hemangiomas - Red papules - Discussed benign nature - Observe - Call for any changes  Actinic Damage - Chronic condition, secondary to cumulative UV/sun exposure - diffuse scaly erythematous macules with underlying dyspigmentation - Recommend daily broad spectrum sunscreen SPF 30+ to sun-exposed areas, reapply every 2 hours as needed.  - Staying in the shade or wearing long sleeves, sun glasses (UVA+UVB protection) and wide brim hats (4-inch brim around the entire circumference of the hat) are also recommended for sun protection.  - Call for new or changing lesions.  Skin cancer screening performed today.  Inflamed seborrheic keratosis (3) Right Back x3  Symptomatic, irritating, patient would like treated.  Destruction of lesion - Right Back x3  Destruction method: cryotherapy   Informed consent: discussed and consent obtained   Lesion destroyed using liquid nitrogen: Yes   Region frozen until ice ball extended beyond lesion: Yes   Outcome: patient tolerated procedure well with no complications   Post-procedure details: wound care instructions given   Additional details:  Prior to procedure, discussed risks of blister formation, small wound, skin dyspigmentation, or rare scar following cryotherapy. Recommend Vaseline ointment to treated areas while healing.    Return in 1 year (on 03/04/2023) for TBSE .  I, Emelia Salisbury, CMA, am acting as scribe for Forest Gleason, MD.   Documentation: I have reviewed the above documentation for accuracy and completeness, and I agree with the above.  Forest Gleason, MD

## 2022-03-03 NOTE — Patient Instructions (Addendum)
Cryotherapy Aftercare  Wash gently with soap and water everyday.   Apply Vaseline daily until healed.    Recommend taking Heliocare sun protection supplement daily in sunny weather for additional sun protection. For maximum protection on the sunniest days, you can take up to 2 capsules of regular Heliocare OR take 1 capsule of Heliocare Ultra. For prolonged exposure (such as a full day in the sun), you can repeat your dose of the supplement 4 hours after your first dose. Heliocare can be purchased at [facility], at some [facility] or at www.heliocare.com.     Recommend daily broad spectrum sunscreen SPF 30+ to sun-exposed areas, reapply every 2 hours as needed. Call for new or changing lesions.  Staying in the shade or wearing long sleeves, sun glasses (UVA+UVB protection) and wide brim hats (4-inch brim around the entire circumference of the hat) are also recommended for sun protection.      Melanoma ABCDEs  Melanoma is the most dangerous type of skin cancer, and is the leading cause of death from skin disease.  You are more likely to develop melanoma if you: Have light-colored skin, light-colored eyes, or red or blond hair Spend a lot of time in the sun Tan regularly, either outdoors or in a tanning bed Have had blistering sunburns, especially during childhood Have a close family member who has had a melanoma Have atypical moles or large birthmarks  Early detection of melanoma is key since treatment is typically straightforward and cure rates are extremely high if we catch it early.   The first sign of melanoma is often a change in a mole or a new dark spot.  The ABCDE system is a way of remembering the signs of melanoma.  A for asymmetry:  The two halves do not match. B for border:  The edges of the growth are irregular. C for color:  A mixture of colors are present instead of an even brown color. D for diameter:  Melanomas are usually (but not always) greater than 6mm -  the size of a pencil eraser. E for evolution:  The spot keeps changing in size, shape, and color.  Please check your skin once per month between visits. You can use a small mirror in front and a large mirror behind you to keep an eye on the back side or your body.   If you see any new or changing lesions before your next follow-up, please call to schedule a visit.  Please continue daily skin protection including broad spectrum sunscreen SPF 30+ to sun-exposed areas, reapplying every 2 hours as needed when you're outdoors.   Staying in the shade or wearing long sleeves, sun glasses (UVA+UVB protection) and wide brim hats (4-inch brim around the entire circumference of the hat) are also recommended for sun protection.     Due to recent changes in healthcare laws, you may see results of your pathology and/or laboratory studies on MyChart before the doctors have had a chance to review them. We understand that in some cases there may be results that are confusing or concerning to you. Please understand that not all results are received at the same time and often the doctors may need to interpret multiple results in order to provide you with the best plan of care or course of treatment. Therefore, we ask that you please give us 2 business days to thoroughly review all your results before contacting the office for clarification. Should we see a critical lab result, you will   be contacted sooner.   If You Need Anything After Your Visit  If you have any questions or concerns for your doctor, please call our main line at [phone] and press option 4 to reach your doctor's medical assistant. If no one answers, please leave a voicemail as directed and we will return your call as soon as possible. Messages left after 4 pm will be answered the following business day.   You may also send us a message via MyChart. We typically respond to MyChart messages within 1-2 business days.  For prescription refills,  please ask your pharmacy to contact our office. Our fax number is [phone].  If you have an urgent issue when the clinic is closed that cannot wait until the next business day, you can page your doctor at the number below.    Please note that while we do our best to be available for urgent issues outside of office hours, we are not available 24/7.   If you have an urgent issue and are unable to reach us, you may choose to seek medical care at your doctor's office, retail clinic, urgent care center, or emergency room.  If you have a medical emergency, please immediately call 911 or go to the emergency department.  Pager Numbers  - Dr. Kowalski: [phone]  - Dr. Moye: [phone]  - Dr. Stewart: [phone]  In the event of inclement weather, please call our main line at [phone] for an update on the status of any delays or closures.  Dermatology Medication Tips: Please keep the boxes that topical medications come in in order to help keep track of the instructions about where and how to use these. Pharmacies typically print the medication instructions only on the boxes and not directly on the medication tubes.   If your medication is too expensive, please contact our office at [phone] option 4 or send us a message through MyChart.   We are unable to tell what your co-pay for medications will be in advance as this is different depending on your insurance coverage. However, we may be able to find a substitute medication at lower cost or fill out paperwork to get insurance to cover a needed medication.   If a prior authorization is required to get your medication covered by your insurance company, please allow us 1-2 business days to complete this process.  Drug prices often vary depending on where the prescription is filled and some pharmacies may offer cheaper prices.  The website www.goodrx.com contains coupons for medications through different pharmacies. The prices  here do not account for what the cost may be with help from insurance (it may be cheaper with your insurance), but the website can give you the price if you did not use any insurance.  - You can print the associated coupon and take it with your prescription to the pharmacy.  - You may also stop by our office during regular business hours and pick up a GoodRx coupon card.  - If you need your prescription sent electronically to a different pharmacy, notify our office through [facility] MyChart or by phone at [phone] option 4.     Si Usted Necesita Algo Despus de Su Visita  Tambin puede enviarnos un mensaje a travs de MyChart. Por lo general respondemos a los mensajes de MyChart en el transcurso de 1 a 2 das hbiles.  Para renovar recetas, por favor pida a su farmacia que se ponga en contacto con nuestra oficina. Nuestro nmero de fax   es el [phone].  Si tiene un asunto urgente cuando la clnica est cerrada y que no puede esperar hasta el siguiente da hbil, puede llamar/localizar a su doctor(a) al nmero que aparece a continuacin.   Por favor, tenga en cuenta que aunque hacemos todo lo posible para estar disponibles para asuntos urgentes fuera del horario de oficina, no estamos disponibles las 24 horas del da, los 7 das de la semana.   Si tiene un problema urgente y no puede comunicarse con nosotros, puede optar por buscar atencin mdica  en el consultorio de su doctor(a), en una clnica privada, en un centro de atencin urgente o en una sala de emergencias.  Si tiene una emergencia mdica, por favor llame inmediatamente al 911 o vaya a la sala de emergencias.  Nmeros de bper  - Dr. Kowalski: [phone]  - Dra. Moye: [phone]  - Dra. Stewart: [phone]  En caso de inclemencias del tiempo, por favor llame a nuestra lnea principal al [phone] para una actualizacin sobre el estado de cualquier retraso o cierre.  Consejos para la medicacin en  dermatologa: Por favor, guarde las cajas en las que vienen los medicamentos de uso tpico para ayudarle a seguir las instrucciones sobre dnde y cmo usarlos. Las farmacias generalmente imprimen las instrucciones del medicamento slo en las cajas y no directamente en los tubos del medicamento.   Si su medicamento es muy caro, por favor, pngase en contacto con nuestra oficina llamando al [phone] y presione la opcin 4 o envenos un mensaje a travs de MyChart.   No podemos decirle cul ser su copago por los medicamentos por adelantado ya que esto es diferente dependiendo de la cobertura de su seguro. Sin embargo, es posible que podamos encontrar un medicamento sustituto a menor costo o llenar un formulario para que el seguro cubra el medicamento que se considera necesario.   Si se requiere una autorizacin previa para que su compaa de seguros cubra su medicamento, por favor permtanos de 1 a 2 das hbiles para completar este proceso.  Los precios de los medicamentos varan con frecuencia dependiendo del lugar de dnde se surte la receta y alguna farmacias pueden ofrecer precios ms baratos.  El sitio web www.goodrx.com tiene cupones para medicamentos de diferentes farmacias. Los precios aqu no tienen en cuenta lo que podra costar con la ayuda del seguro (puede ser ms barato con su seguro), pero el sitio web puede darle el precio si no utiliz ningn seguro.  - Puede imprimir el cupn correspondiente y llevarlo con su receta a la farmacia.  - Tambin puede pasar por nuestra oficina durante el horario de atencin regular y recoger una tarjeta de cupones de GoodRx.  - Si necesita que su receta se enve electrnicamente a una farmacia diferente, informe a nuestra oficina a travs de MyChart de [facility] o por telfono llamando al [phone] y presione la opcin 4.  

## 2022-03-05 ENCOUNTER — Encounter: Payer: Self-pay | Admitting: Dermatology

## 2022-10-05 ENCOUNTER — Other Ambulatory Visit: Payer: Self-pay | Admitting: Otolaryngology

## 2022-10-05 DIAGNOSIS — J320 Chronic maxillary sinusitis: Secondary | ICD-10-CM

## 2023-03-09 ENCOUNTER — Encounter: Payer: Medicare PPO | Admitting: Dermatology

## 2023-03-18 ENCOUNTER — Ambulatory Visit
Admission: EM | Admit: 2023-03-18 | Discharge: 2023-03-18 | Disposition: A | Discharge status: Home / Self Care | Payer: No Typology Code available for payment source | Attending: Emergency Medicine | Admitting: Emergency Medicine

## 2023-03-18 ENCOUNTER — Other Ambulatory Visit: Payer: Self-pay

## 2023-03-18 ENCOUNTER — Encounter: Payer: Self-pay | Admitting: Emergency Medicine

## 2023-03-18 DIAGNOSIS — Z23 Encounter for immunization: Secondary | ICD-10-CM | POA: Diagnosis not present

## 2023-03-18 DIAGNOSIS — S81811A Laceration without foreign body, right lower leg, initial encounter: Secondary | ICD-10-CM

## 2023-03-18 MED ORDER — CEPHALEXIN 500 MG PO CAPS: 500.0000 mg | ORAL_CAPSULE | Freq: Four times a day (QID) | ORAL | 0 refills | Status: DC

## 2023-03-18 MED ORDER — TETANUS-DIPHTH-ACELL PERTUSSIS 5-2.5-18.5 LF-MCG/0.5 IM SUSY
0.5000 mL | PREFILLED_SYRINGE | Freq: Once | INTRAMUSCULAR | Status: AC
  Administered 2023-03-18: 0.5 mL via INTRAMUSCULAR

## 2023-03-18 NOTE — ED Provider Notes (Signed)
Austin Knapp    CSN: [id number] Arrival date & time: 03/18/23  1546      History   Chief Complaint Chief Complaint  Patient presents with   Laceration    HPI Austin Knapp is a 77 y.o. male.  Patient presents with a laceration on his right calf that occurred this morning when he was using an oscillating saw.  He accidentally cut his leg.  Attempted treatment with direct pressure and Steri-Strips but bleeding has continued.  No numbness, weakness, or other symptoms.  Last tetanus 2017.  The history is provided by the patient and medical records.    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 ENDOSCOPY;  Service: Endoscopy;  Laterality: N/A;   HERNIA REPAIR Right 08/2012   MASTOIDECTOMY     tongue excision     04/2012   VASECTOMY         Home Medications    Prior to Admission medications   Medication Sig Start Date End Date Taking? Authorizing Provider  cephALEXin (KEFLEX) 500 MG capsule Take 1 capsule (500 mg total) by mouth 4 (four) times daily. 03/18/23  Yes Mickie Bail, NP  ANDROGEL PUMP 20.25 MG/ACT (1.62%) GEL APPLY 4 PUMPS ONCE DAILY AS DIRECTED. 11/18/13   Dianne Dun, MD  aspirin 81 MG tablet Take 81 mg by mouth daily.    [provider]  calcium carbonate (OS-CAL) 600 MG TABS Take 600 mg by mouth  2 (two) times daily with a meal.    [provider]  Cholecalciferol (VITAMIN D3) 2000 units TABS Take 1 capsule by mouth.    [provider]  doxazosin (CARDURA) 2 MG tablet Take 1 tablet by mouth daily. 06/27/18   [provider]  DOXAZOSIN MESYLATE PO Take 1 tablet by mouth at bedtime.  Patient not taking: Reported on 02/12/2020    [provider]  fluticasone Aleda Grana) 50 MCG/ACT nasal spray instill 1 spray into each nostril once daily 04/25/14   Dianne Dun, MD  hydrochlorothiazide (MICROZIDE) 12.5 MG capsule Take 1 capsule (12.5 mg total) by mouth daily. 10/25/17   Tower, Audrie Gallus, MD  levothyroxine (SYNTHROID) 25 MCG tablet Take 25 mcg by mouth daily before breakfast.    [provider]  Multiple Vitamin (MULTIVITAMIN) tablet Take 1 tablet by mouth daily.    [provider]  lisinopril-hydrochlorothiazide (PRINZIDE,ZESTORETIC) 10-12.5 MG per tablet Take 1 tablet by mouth daily.  08/26/11  [provider]    Family History Family History  Problem Relation Age of Onset   Macular degeneration Mother    Cancer Father    AAA (abdominal aortic aneurysm) Father    AAA (abdominal aortic aneurysm) Sister     Social History Social History   Tobacco Use   Smoking  status: Former   Smokeless tobacco: Never  Vaping Use   Vaping status: Never Used  Substance Use Topics   Alcohol use: Yes    Comment: occassional   Drug use: No     Allergies   Clindamycin/lincomycin and Latex   Review of Systems Review of Systems  Constitutional:  Negative for chills and fever.  Musculoskeletal:  Negative for arthralgias and joint swelling.  Skin:  Positive for wound. Negative for color change.  Neurological:  Negative for weakness and numbness.     Physical Exam Triage Vital Signs ED Triage Vitals [03/18/23 1604]  Encounter Vitals Group     BP      Systolic BP Percentile      Diastolic BP Percentile      Pulse      Resp       Temp      Temp src      SpO2      Weight 190 lb (86.2 kg)     Height 5\' 9"  (1.753 m)     Head Circumference      Peak Flow      Pain Score 0     Pain Loc      Pain Education      Exclude from Growth Chart    No data found.  Updated Vital Signs Ht 5\' 9"  (1.753 m)   Wt 190 lb (86.2 kg)   BMI 28.06 kg/m   Visual Acuity Right Eye Distance:   Left Eye Distance:   Bilateral Distance:    Right Eye Near:   Left Eye Near:    Bilateral Near:     Physical Exam Constitutional:      General: He is not in acute distress. HENT:     Mouth/Throat:     Mouth: Mucous membranes are moist.  Cardiovascular:     Rate and Rhythm: Normal rate and regular rhythm.  Pulmonary:     Effort: Pulmonary effort is normal. No respiratory distress.  Musculoskeletal:        General: No deformity. Normal range of motion.  Skin:    General: Skin is warm and dry.     Capillary Refill: Capillary refill takes less than 2 seconds.     Findings: Lesion present.     Comments: 6 cm linear jagged laceration on right calf.  Bleeding controlled with direct pressure.  Neurological:     General: No focal deficit present.     Mental Status: He is alert and oriented to person, place, and time.     Sensory: No sensory deficit.     Motor: No weakness.     Gait: Gait normal.  Psychiatric:        Mood and Affect: Mood normal.        Behavior: Behavior normal.      UC Treatments / Results  Labs (all labs ordered are listed, but only abnormal results are displayed) Labs Reviewed - No data to display  EKG   Radiology No results found.  Procedures Laceration Repair  Date/Time: 03/18/2023 4:44 PM  Performed by: Mickie Bail, NP Authorized by: Mickie Bail, NP   Consent:    Consent obtained:  Verbal   Consent given by:  Patient   Risks discussed:  Infection, pain, poor cosmetic result and poor wound healing Universal protocol:    Procedure explained and questions answered to patient or proxy's  satisfaction: yes   Anesthesia:    Anesthesia method:  Local infiltration  Local anesthetic:  Lidocaine 1% WITH epi Laceration details:    Location:  Leg   Leg location:  R lower leg   Length (cm):  6   Depth (mm):  3 Pre-procedure details:    Preparation:  Patient was prepped and draped in usual sterile fashion Exploration:    Hemostasis achieved with:  Direct pressure   Imaging outcome: foreign body not noted     Wound exploration: wound explored through full range of motion and entire depth of wound visualized   Treatment:    Area cleansed with:  Povidone-iodine   Amount of cleaning:  Standard   Irrigation solution:  Sterile water   Irrigation method:  Syringe   Visualized foreign bodies/material removed: no   Skin repair:    Repair method:  Sutures   Suture size:  3-0   Suture material:  Nylon   Suture technique:  Simple interrupted   Number of sutures:  4 Approximation:    Approximation:  Close Repair type:    Repair type:  Simple Post-procedure details:    Dressing:  Antibiotic ointment and non-adherent dressing   Procedure completion:  Tolerated well, no immediate complications  (including critical care time)  Medications Ordered in UC Medications  Tdap (BOOSTRIX) injection 0.5 mL (0.5 mLs Intramuscular Given 03/18/23 1642)    Initial Impression / Assessment and Plan / UC Course  I have reviewed the triage vital signs and the nursing notes.  Pertinent labs & imaging results that were available during my care of the patient were reviewed by me and considered in my medical decision making (see chart for details).    Laceration of right calf.  4 sutures and Steri-Strips.  This updated.  Wound care instructions and signs of infection discussed.  Treating with cephalexin.  Tylenol or ibuprofen as needed.  Instructed patient to return here for suture removal in 7 to 10 days.  Education provided on laceration care.  He agrees to plan of care.  Final Clinical  Impressions(s) / UC Diagnoses   Final diagnoses:  Laceration of calf, right, initial encounter     Discharge Instructions      Your stitches need to be taken out in 7 to 10 days.    Take the cephalexin as directed.    Your tetanus was updated today.    Keep your wound clean and dry.  Wash it gently twice a day with soap and water.    Follow-up right away if you see signs of infection, such as increased pain, redness, pus-like drainage, warmth, fever, chills, or other concerning symptoms.        ED Prescriptions     Medication Sig Dispense Auth. Provider   cephALEXin (KEFLEX) 500 MG capsule Take 1 capsule (500 mg total) by mouth 4 (four) times daily. 28 capsule Mickie Bail, NP      PDMP not reviewed this encounter.   Mickie Bail, NP 03/18/23 [phone]

## 2023-03-18 NOTE — ED Notes (Signed)
Dressing applied to RLE per provider

## 2023-03-18 NOTE — ED Triage Notes (Signed)
Pt here for laceration to right calf.  Pt tripped and right calf hit an oscillating saw.  Pt arrived with steristrip and pressure dressing to leg with slight active bleeding noted.  Undressed and cleaned with sterile saline. Steri strip left in place until provider in room.   Last Tdap 2017

## 2023-03-18 NOTE — Discharge Instructions (Signed)
Your stitches need to be taken out in 7 to 10 days.    Take the cephalexin as directed.    Your tetanus was updated today.    Keep your wound clean and dry.  Wash it gently twice a day with soap and water.    Follow-up right away if you see signs of infection, such as increased pain, redness, pus-like drainage, warmth, fever, chills, or other concerning symptoms.

## 2023-03-23 ENCOUNTER — Ambulatory Visit: Payer: Medicare PPO | Admitting: Dermatology

## 2023-03-23 ENCOUNTER — Encounter: Payer: Self-pay | Admitting: Dermatology

## 2023-03-23 VITALS — BP 119/77

## 2023-03-23 DIAGNOSIS — L719 Rosacea, unspecified: Secondary | ICD-10-CM

## 2023-03-23 DIAGNOSIS — L578 Other skin changes due to chronic exposure to nonionizing radiation: Secondary | ICD-10-CM

## 2023-03-23 DIAGNOSIS — L570 Actinic keratosis: Secondary | ICD-10-CM | POA: Diagnosis not present

## 2023-03-23 DIAGNOSIS — W908XXA Exposure to other nonionizing radiation, initial encounter: Secondary | ICD-10-CM | POA: Diagnosis not present

## 2023-03-23 DIAGNOSIS — D229 Melanocytic nevi, unspecified: Secondary | ICD-10-CM

## 2023-03-23 DIAGNOSIS — Z1283 Encounter for screening for malignant neoplasm of skin: Secondary | ICD-10-CM

## 2023-03-23 DIAGNOSIS — L821 Other seborrheic keratosis: Secondary | ICD-10-CM | POA: Diagnosis not present

## 2023-03-23 DIAGNOSIS — D1801 Hemangioma of skin and subcutaneous tissue: Secondary | ICD-10-CM

## 2023-03-23 DIAGNOSIS — S81811D Laceration without foreign body, right lower leg, subsequent encounter: Secondary | ICD-10-CM

## 2023-03-23 DIAGNOSIS — L814 Other melanin hyperpigmentation: Secondary | ICD-10-CM

## 2023-03-23 DIAGNOSIS — Z7189 Other specified counseling: Secondary | ICD-10-CM

## 2023-03-23 NOTE — Patient Instructions (Addendum)
Cryotherapy Aftercare  Wash gently with soap and water everyday.   Apply Vaseline and Band-Aid daily until healed.   Actinic Keratosis  What is an actinic keratosis? An actinic keratosis (plural: actinic keratoses) is growth on the surface of the skin that usually appears as a red, hard, crusty or scaly bump.   What causes actinic keratoses? Repeated prolonged sun exposure causes skin damage, especially in fair-skinned persons. Sun-damaged skin becomes dry and wrinkled and may form rough, scaly spots called actinic keratoses. These rough spots remain on the skin even though the crust or scale on top is picked off.   Why treat actinic keratoses? Actinic keratoses are not skin cancers, but because they may sometimes turn cancerous they are called "pre-cancerous". Not all will turn to skin cancer, and it usually takes several years for this to happen. Because it is much easier to treat an actinic keratosis then it is to remove a skin cancer, actinic keratoses should be treated to prevent future skin cancer.   How are actinic keratoses treated? The most common way of treating actinic keratoses is to freeze them with liquid nitrogen. Freezing causes scabbing and shedding of the sun-damaged skin. Healing after a removal usually takes two weeks, depending on the size and location of the keratosis. Hands and legs heal more slowly than the face. The skin's final appearance is usually excellent. There are several topical medications that can be used to treat actinic keratoses. These medications generally have side effects of redness, crusting, and pain. Some are used for a few days, and some for several months before the actinic keratosis is completely gone. Photodynamic therapy is another alternative to freezing actinic keratoses. This treatment is done in a physician's office. A medication is applied to the area of skin with actinic keratoses, and it is allowed to soak in for one or more hours. A special  light is then applied to the skin. Side effects include redness, burning, and peeling.  How can you prevent actinic keratoses? Protection from the sun is the best way to prevent actinic keratoses. The use of proper clothing and sunscreens can prevent the sun damage that leads to an actinic keratosis.  Unfortunately, some sun damage is permanent. Once sun damage has progressed to the point where actinic keratoses develop, new keratoses may appear even without further sun exposure. However, even in skin that is already heavily sun damaged, good sun protection can help reduce the number of actinic keratoses that will appear.       Due to recent changes in healthcare laws, you may see results of your pathology and/or laboratory studies on MyChart before the doctors have had a chance to review them. We understand that in some cases there may be results that are confusing or concerning to you. Please understand that not all results are received at the same time and often the doctors may need to interpret multiple results in order to provide you with the best plan of care or course of treatment. Therefore, we ask that you please give [place] 2 business days to thoroughly review all your results before contacting the office for clarification. Should we see a critical lab result, you will be contacted sooner.   If You Need Anything After Your Visit  If you have any questions or concerns for your doctor, please call our main line at [phone] and press option 4 to reach your doctor's medical assistant. If no one answers, please leave a voicemail as directed and we will  return your call as soon as possible. Messages left after 4 pm will be answered the following business day.   You may also send [place] a message via MyChart. We typically respond to MyChart messages within 1-2 business days.  For prescription refills, please ask your pharmacy to contact our office. Our fax number is [phone].  If you have an  urgent issue when the clinic is closed that cannot wait until the next business day, you can page your doctor at the number below.    Please note that while we do our best to be available for urgent issues outside of office hours, we are not available 24/7.   If you have an urgent issue and are unable to reach [place], you may choose to seek medical care at your doctor's office, retail clinic, urgent care center, or emergency room.  If you have a medical emergency, please immediately call 911 or go to the emergency department.  Pager Numbers  - Dr. Gwen Pounds: [phone]  - Dr. Roseanne Reno: [phone]  - Dr. Katrinka Blazing: [phone]   In the event of inclement weather, please call our main line at [phone] for an update on the status of any delays or closures.  Dermatology Medication Tips: Please keep the boxes that topical medications come in in order to help keep track of the instructions about where and how to use these. Pharmacies typically print the medication instructions only on the boxes and not directly on the medication tubes.   If your medication is too expensive, please contact our office at [phone] option 4 or send [place] a message through MyChart.   We are unable to tell what your co-pay for medications will be in advance as this is different depending on your insurance coverage. However, we may be able to find a substitute medication at lower cost or fill out paperwork to get insurance to cover a needed medication.   If a prior authorization is required to get your medication covered by your insurance company, please allow [place] 1-2 business days to complete this process.  Drug prices often vary depending on where the prescription is filled and some pharmacies may offer cheaper prices.  The website www.goodrx.com contains coupons for medications through different pharmacies. The prices here do not account for what the cost may be with help from insurance (it may be cheaper with your  insurance), but the website can give you the price if you did not use any insurance.  - You can print the associated coupon and take it with your prescription to the pharmacy.  - You may also stop by our office during regular business hours and pick up a GoodRx coupon card.  - If you need your prescription sent electronically to a different pharmacy, notify our office through [facility] or by phone at [phone] option 4.     Si Usted Necesita Algo Despus de Su Visita  Tambin puede enviarnos un mensaje a travs de Clinical cytogeneticist. Por lo general respondemos a los mensajes de MyChart en el transcurso de 1 a 2 das hbiles.  Para renovar recetas, por favor pida a su farmacia que se ponga en contacto con nuestra oficina. Annie Sable de fax es [place] [phone].  Si tiene un asunto urgente cuando la clnica est cerrada y que no puede esperar hasta el siguiente da hbil, puede llamar/localizar a su doctor(a) al nmero que aparece a continuacin.   Por favor, tenga en cuenta que aunque hacemos todo lo posible para estar disponibles para  asuntos urgentes fuera del horario de [place], no estamos disponibles las 24 horas del da, los 7 [address]  [address] de la [place].   Si tiene un problema urgente y no puede comunicarse con nosotros, puede optar por buscar atencin mdica  en el consultorio de su doctor(a), en una clnica privada, en un centro de atencin urgente o en una sala de emergencias.  Si tiene Engineer, drilling, por favor llame inmediatamente al 911 o vaya a la sala de emergencias.  Nmeros de bper  - Dr. Gwen Pounds: [phone]  - Dra. Roseanne Reno: [phone]  - Dr. Katrinka Blazing: [phone]   En caso de inclemencias del tiempo, por favor llame a Lacy Duverney principal al [phone] para una actualizacin sobre el [place] de cualquier retraso o cierre.  Consejos para la medicacin en dermatologa: Por favor, guarde las cajas en las que vienen los medicamentos de uso tpico para ayudarle a  seguir las instrucciones sobre dnde y cmo usarlos. Las farmacias generalmente imprimen las instrucciones del medicamento slo en las cajas y no directamente en los tubos del [place].   Si su medicamento es muy caro, por favor, pngase en contacto con Rolm Gala llamando al [phone] y presione la opcin 4 o envenos un mensaje a travs de Clinical cytogeneticist.   No podemos decirle cul ser su copago por los medicamentos por adelantado ya que esto es diferente dependiendo de la cobertura de su seguro. Sin embargo, es posible que podamos encontrar un medicamento sustituto a Audiological scientist un formulario para que el seguro cubra el medicamento que se considera necesario.   Si se requiere una autorizacin previa para que su compaa de seguros [place] su medicamento, por favor permtanos de 1 a 2 das hbiles para completar [address]  Los precios de los medicamentos varan con frecuencia dependiendo del Environmental consultant de dnde se surte la receta y alguna farmacias pueden ofrecer precios ms baratos.  El sitio web www.goodrx.com tiene cupones para medicamentos de Health and safety inspector. Los precios aqu no tienen en cuenta lo que podra costar con la ayuda del seguro (puede ser ms barato con su seguro), pero el sitio web puede darle el precio si no utiliz Tourist information centre manager.  - Puede imprimir el cupn correspondiente y llevarlo con su receta a la farmacia.  - Tambin puede pasar por nuestra oficina durante el horario de atencin regular y Education officer, museum una tarjeta de cupones de GoodRx.  - Si necesita que su receta se enve electrnicamente a una farmacia diferente, informe a nuestra oficina a travs de MyChart de [place] o por telfono llamando al [phone] y presione la opcin 4.

## 2023-03-23 NOTE — Progress Notes (Signed)
Follow-Up Visit   Subjective  Austin Knapp is a 77 y.o. male who presents for the following: Skin Cancer Screening and Full Body Skin Exam, no hx of skin ca, no fhx of skin ca  The patient presents for Total-Body Skin Exam (TBSE) for skin cancer screening and mole check. The patient has spots, moles and lesions to be evaluated, some may be new or changing and the patient may have concern these could be cancer.    The following portions of the chart were reviewed this encounter and updated as appropriate: medications, allergies, medical history  Review of Systems:  No other skin or systemic complaints except as noted in HPI or Assessment and Plan.  Objective  Well appearing patient in no apparent distress; mood and affect are within normal limits.  A full examination was performed including scalp, head, eyes, ears, nose, lips, neck, chest, axillae, abdomen, back, buttocks, bilateral upper extremities, bilateral lower extremities, hands, feet, fingers, toes, fingernails, and toenails. All findings within normal limits unless otherwise noted below.   Relevant physical exam findings are noted in the Assessment and Plan.  vertex scalp x 1, post vertex scalp x 1, sup forehead x 1, L preauricular x 1, R cheek x 1, (5) Pink scaly macules    Assessment & Plan   SKIN CANCER SCREENING PERFORMED TODAY.  ACTINIC DAMAGE - Chronic condition, secondary to cumulative UV/sun exposure - diffuse scaly erythematous macules with underlying dyspigmentation - Recommend daily broad spectrum sunscreen SPF 30+ to sun-exposed areas, reapply every 2 hours as needed.  - Staying in the shade or wearing long sleeves, sun glasses (UVA+UVB protection) and wide brim hats (4-inch brim around the entire circumference of the hat) are also recommended for sun protection.  - Call for new or changing lesions.  LENTIGINES, SEBORRHEIC KERATOSES, HEMANGIOMAS - Benign normal skin lesions - Benign-appearing - Call for any  changes  MELANOCYTIC NEVI - Tan-brown and/or pink-flesh-colored symmetric macules and papules - Benign appearing on exam today - Observation - Call clinic for new or changing moles - Recommend daily use of broad spectrum spf 30+ sunscreen to sun-exposed areas.   ROSACEA face Exam Mid face erythema with telangiectasias   Chronic and persistent condition with duration or expected duration over one year. Condition is symptomatic / bothersome to patient. Not to goal.   Rosacea is a chronic progressive skin condition usually affecting the face of adults, causing redness and/or acne bumps. It is treatable but not curable. It sometimes affects the eyes (ocular rosacea) as well. It may respond to topical and/or systemic medication and can flare with stress, sun exposure, alcohol, exercise, topical steroids (including hydrocortisone/cortisone 10) and some foods.  Daily application of broad spectrum spf 30+ sunscreen to face is recommended to reduce flares.   Treatment Plan Discussed BBL, patient can schedule with Dr. Gwen Pounds  Counseling for BBL / IPL / Laser and Coordination of Care Discussed the treatment option of Broad Band Light (BBL) Windell Moulding Pulsed Light (IPL)/ Laser for skin discoloration, including brown spots and redness.  Typically we recommend at least 1-3 treatment sessions about 5-8 weeks apart for best results.  Cannot have tanned skin when BBL performed, and regular use of sunscreen/photoprotection is advised after the procedure to help maintain results. The patient's condition may also require "maintenance treatments" in the future.  The fee for BBL / laser treatments is $350 per treatment session for the whole face.  A fee can be quoted for other parts of the body.  Insurance typically does not pay for BBL/laser treatments and therefore the fee is an out-of-pocket cost. Recommend prophylactic valtrex treatment. Once scheduled for procedure, will send Rx in prior to patient's  appointment.   Pt declines valtrex, no hx of HSV.  SUTURED LACERATION Secondary to trauma R post lower leg Exam: sutured laceration   Treatment Plan: Healing laceration, cont wound care   AK (actinic keratosis) (5) vertex scalp x 1, post vertex scalp x 1, sup forehead x 1, L preauricular x 1, R cheek x 1,  Actinic keratoses are precancerous spots that appear secondary to cumulative UV radiation exposure/sun exposure over time. They are chronic with expected duration over 1 year. A portion of actinic keratoses will progress to squamous cell carcinoma of the skin. It is not possible to reliably predict which spots will progress to skin cancer and so treatment is recommended to prevent development of skin cancer.  Recommend daily broad spectrum sunscreen SPF 30+ to sun-exposed areas, reapply every 2 hours as needed.  Recommend staying in the shade or wearing long sleeves, sun glasses (UVA+UVB protection) and wide brim hats (4-inch brim around the entire circumference of the hat). Call for new or changing lesions.  Destruction of lesion - vertex scalp x 1, post vertex scalp x 1, sup forehead x 1, L preauricular x 1, R cheek x 1, (5) Complexity: simple   Destruction method: cryotherapy   Informed consent: discussed and consent obtained   Timeout:  patient name, date of birth, surgical site, and procedure verified Lesion destroyed using liquid nitrogen: Yes   Region frozen until ice ball extended beyond lesion: Yes   Cryo cycles: 1 or 2. Outcome: patient tolerated procedure well with no complications   Post-procedure details: wound care instructions given    Multiple benign nevi  Actinic elastosis  Seborrheic keratoses  Cherry angioma  Lentigines   Return in about 1 year (around 03/22/2024) for TBSE, Hx of AKs, can schedule BBL with Dr. Gwen Pounds.  I, Ardis Rowan, RMA, am acting as scribe for Elie Goody, MD .   Documentation: I have reviewed the above documentation  for accuracy and completeness, and I agree with the above.  Elie Goody, MD

## 2023-03-27 ENCOUNTER — Ambulatory Visit
Admission: RE | Admit: 2023-03-27 | Discharge: 2023-03-27 | Disposition: A | Discharge status: Home / Self Care | Payer: No Typology Code available for payment source | Source: Ambulatory Visit | Attending: Physician Assistant | Admitting: Physician Assistant

## 2023-03-27 VITALS — BP 128/81 | HR 65 | Temp 98.7°F | Resp 17

## 2023-03-27 DIAGNOSIS — T148XXA Other injury of unspecified body region, initial encounter: Secondary | ICD-10-CM | POA: Diagnosis not present

## 2023-03-27 DIAGNOSIS — L089 Local infection of the skin and subcutaneous tissue, unspecified: Secondary | ICD-10-CM | POA: Diagnosis not present

## 2023-03-27 DIAGNOSIS — Z4802 Encounter for removal of sutures: Secondary | ICD-10-CM

## 2023-03-27 MED ORDER — MUPIROCIN 2 % EX OINT: 1.0000 | TOPICAL_OINTMENT | Freq: Two times a day (BID) | CUTANEOUS | 0 refills | Status: DC

## 2023-03-27 MED ORDER — DOXYCYCLINE HYCLATE 100 MG PO CAPS: 100.0000 mg | ORAL_CAPSULE | Freq: Two times a day (BID) | ORAL | 0 refills | Status: DC

## 2023-03-27 MED ORDER — DOXYCYCLINE HYCLATE 100 MG PO CAPS
100.0000 mg | ORAL_CAPSULE | Freq: Two times a day (BID) | ORAL | 0 refills | Status: DC
Start: 2023-03-27 — End: 2023-03-27

## 2023-03-27 NOTE — Discharge Instructions (Signed)
We remove the sutures today.  I am concerned that there is an ongoing infection.  Please start doxycycline 100 mg twice daily for 7 days.  Stay out of the sun while on this medication as it can cause a bad sunburn.  Keep the area clean with soap and water and apply mupirocin ointment.  If this is not improving within a few days please return for reevaluation.  If anything changes and you have increasing redness, additional drainage, fever, nausea, vomiting, red streaks you need to be seen immediately.

## 2023-03-27 NOTE — ED Triage Notes (Addendum)
Patient to Urgent Care for suture removal. Patient had four sutures placed to right calf on 10/5 after tripping and hitting his leg on an oscillating saw.   Area w/ purulent drainage and redness. Denies any other complaints.

## 2023-03-27 NOTE — ED Provider Notes (Addendum)
Renaldo Fiddler    CSN: [id number] Arrival date & time: 03/27/23  8295      History   Chief Complaint Chief Complaint  Patient presents with   Follow-up   Suture / Staple Removal    HPI TOM MACPHERSON is a 77 y.o. male.   Patient presents today for suture removal.  03/18/2023 he was working on a tree house using oscillating saw when this cut his right lower leg.  He was seen by our clinic and 4 sutures were placed while Steri-Strips were used in another portion of the wound.  He was given cephalexin and reports compliance with this medication.  Despite this there has been some swelling and drainage.  He denies any fever, nausea, vomiting.  Denies history of recurrent skin infections or MRSA.    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 Authorizing Provider  ANDROGEL PUMP 20.25 MG/ACT (1.62%) GEL APPLY 4 PUMPS ONCE DAILY AS DIRECTED. 11/18/13   Dianne Dun, MD  aspirin 81 MG tablet Take 81 mg by mouth daily.    [provider]  Cholecalciferol  (VITAMIN D3) 2000 units TABS Take 1 capsule by mouth.    [provider]  doxycycline (VIBRAMYCIN) 100 MG capsule Take 1 capsule (100 mg total) by mouth 2 (two) times daily. 03/27/23   Elonda Giuliano, Noberto Retort, PA-C  fluticasone (FLONASE) 50 MCG/ACT nasal spray instill 1 spray into each nostril once daily 04/25/14   Dianne Dun, MD  hydrochlorothiazide (MICROZIDE) 12.5 MG capsule Take 1 capsule (12.5 mg total) by mouth daily. 10/25/17   Tower, Audrie Gallus, MD  levothyroxine (SYNTHROID) 25 MCG tablet Take 25 mcg by mouth daily before breakfast.    [provider]  Multiple Vitamin (MULTIVITAMIN) tablet Take 1 tablet by mouth daily.    [provider]  mupirocin ointment (BACTROBAN) 2 % Apply 1 Application topically 2 (two) times daily. 03/27/23   Daelynn Blower, Noberto Retort, PA-C  lisinopril-hydrochlorothiazide (PRINZIDE,ZESTORETIC) 10-12.5 MG per tablet Take 1 tablet by mouth daily.  08/26/11  [provider]    Family History Family History  Problem Relation Age of Onset   Macular degeneration Mother    Cancer Father    AAA (abdominal aortic aneurysm) Father    AAA (abdominal aortic aneurysm) Sister     Social History Social History   Tobacco Use   Smoking status: Former   Smokeless tobacco: Never  Advertising account planner  Vaping status: Never Used  Substance Use Topics   Alcohol use: Yes    Comment: occassional   Drug use: No     Allergies   Clindamycin/lincomycin and Latex   Review of Systems Review of Systems  Constitutional:  Negative for activity change, appetite change, fatigue and fever.  Gastrointestinal:  Negative for abdominal pain, diarrhea, nausea and vomiting.  Musculoskeletal:  Negative for arthralgias and myalgias.  Skin:  Positive for color change and wound.  Neurological:  Negative for numbness.     Physical Exam Triage Vital Signs ED Triage Vitals [03/27/23 0926]  Encounter Vitals Group     BP 128/81     Systolic BP Percentile      Diastolic BP  Percentile      Pulse Rate 65     Resp 17     Temp 98.7 F (37.1 C)     Temp src      SpO2 97 %     Weight      Height      Head Circumference      Peak Flow      Pain Score      Pain Loc      Pain Education      Exclude from Growth Chart    No data found.  Updated Vital Signs BP 128/81   Pulse 65   Temp 98.7 F (37.1 C)   Resp 17   SpO2 97%   Visual Acuity Right Eye Distance:   Left Eye Distance:   Bilateral Distance:    Right Eye Near:   Left Eye Near:    Bilateral Near:     Physical Exam Vitals reviewed.  Constitutional:      General: He is awake.     Appearance: Normal appearance. He is well-developed. He is not ill-appearing.     Comments: Very pleasant male appears stated age in no acute distress sitting comfortably in exam room  HENT:     Head: Normocephalic and atraumatic.  Cardiovascular:     Rate and Rhythm: Normal rate and regular rhythm.     Heart sounds: Normal heart sounds, S1 normal and S2 normal. No murmur heard. Pulmonary:     Effort: Pulmonary effort is normal.     Breath sounds: Normal breath sounds. No stridor. No wheezing, rhonchi or rales.     Comments: Clear to auscultation bilaterally Neurological:     Mental Status: He is alert.  Psychiatric:        Behavior: Behavior is cooperative.      UC Treatments / Results  Labs (all labs ordered are listed, but only abnormal results are displayed) Labs Reviewed - No data to display  EKG   Radiology No results found.  Procedures Procedures (including critical care time)  Medications Ordered in UC Medications - No data to display  Initial Impression / Assessment and Plan / UC Course  I have reviewed the triage vital signs and the nursing notes.  Pertinent labs & imaging results that were available during my care of the patient were reviewed by me and considered in my medical decision making (see chart for details).     Patient initially presented for suture removal,  however, there was concern for infection given how the wound appeared and so I was called in to evaluate him.  4 sutures were removed without dehiscence.  It does appear there is a secondary infection.  He was just treated with cephalexin so we will use doxycycline  to cover for MRSA.  Discussed that this can cause photosensitivity and so he is to avoid prolonged sun exposure while on this medication.  We discussed wound care procedure including cleaning it with soap and water and applying Bactroban ointment.  He is to demarcate the area of erythema and if this continues to spread or he develops any additional symptoms including streaking, change in drainage, fever, nausea, vomiting, pain he needs to be seen immediately.  Strict return precautions given.  All questions answered to patient satisfaction.  Final Clinical Impressions(s) / UC Diagnoses   Final diagnoses:  Wound infection  Skin infection  Encounter for removal of sutures     Discharge Instructions      We remove the sutures today.  I am concerned that there is an ongoing infection.  Please start doxycycline 100 mg twice daily for 7 days.  Stay out of the sun while on this medication as it can cause a bad sunburn.  Keep the area clean with soap and water and apply mupirocin ointment.  If this is not improving within a few days please return for reevaluation.  If anything changes and you have increasing redness, additional drainage, fever, nausea, vomiting, red streaks you need to be seen immediately.     ED Prescriptions     Medication Sig Dispense Auth. Provider   mupirocin ointment (BACTROBAN) 2 %  (Status: Discontinued) Apply 1 Application topically 2 (two) times daily. 22 g Jaidin Richison K, PA-C   doxycycline (VIBRAMYCIN) 100 MG capsule  (Status: Discontinued) Take 1 capsule (100 mg total) by mouth 2 (two) times daily. 14 capsule Avery Eustice K, PA-C   doxycycline (VIBRAMYCIN) 100 MG capsule Take 1 capsule (100 mg total) by mouth  2 (two) times daily. 14 capsule Leonid Manus K, PA-C   mupirocin ointment (BACTROBAN) 2 % Apply 1 Application topically 2 (two) times daily. 22 g Jamontae Thwaites K, PA-C      PDMP not reviewed this encounter.   Jeani Hawking, PA-C 03/27/23 1610    Jeani Hawking, PA-C 03/27/23 1403

## 2023-05-08 ENCOUNTER — Telehealth: Payer: Self-pay | Admitting: Family Medicine

## 2023-05-08 ENCOUNTER — Ambulatory Visit (INDEPENDENT_AMBULATORY_CARE_PROVIDER_SITE_OTHER): Payer: Medicare PPO | Admitting: Family Medicine

## 2023-05-08 ENCOUNTER — Ambulatory Visit (INDEPENDENT_AMBULATORY_CARE_PROVIDER_SITE_OTHER)
Admission: RE | Admit: 2023-05-08 | Discharge: 2023-05-08 | Disposition: A | Discharge status: Home / Self Care | Payer: Medicare PPO | Source: Ambulatory Visit | Attending: Family Medicine | Admitting: Family Medicine

## 2023-05-08 VITALS — BP 124/70 | HR 80 | Temp 98.0°F | Resp 16 | Ht 69.0 in | Wt 196.0 lb

## 2023-05-08 DIAGNOSIS — M79671 Pain in right foot: Secondary | ICD-10-CM

## 2023-05-08 DIAGNOSIS — C029 Malignant neoplasm of tongue, unspecified: Secondary | ICD-10-CM | POA: Diagnosis not present

## 2023-05-08 DIAGNOSIS — M109 Gout, unspecified: Secondary | ICD-10-CM

## 2023-05-08 DIAGNOSIS — I10 Essential (primary) hypertension: Secondary | ICD-10-CM

## 2023-05-08 DIAGNOSIS — M79673 Pain in unspecified foot: Secondary | ICD-10-CM | POA: Insufficient documentation

## 2023-05-08 LAB — COMPREHENSIVE METABOLIC PANEL
ALT: 20 U/L (ref 0–53)
AST: 19 U/L (ref 0–37)
Albumin: 4.3 g/dL (ref 3.5–5.2)
Alkaline Phosphatase: 48 U/L (ref 39–117)
BUN: 20 mg/dL (ref 6–23)
CO2: 27 meq/L (ref 19–32)
Calcium: 9.2 mg/dL (ref 8.4–10.5)
Chloride: 102 meq/L (ref 96–112)
Creatinine, Ser: 1.01 mg/dL (ref 0.40–1.50)
GFR: 72.24 mL/min (ref >60.00)
Glucose, Bld: 89 mg/dL (ref 70–99)
Potassium: 4.4 meq/L (ref 3.5–5.1)
Sodium: 138 meq/L (ref 135–145)
Total Bilirubin: 1 mg/dL (ref 0.2–1.2)
Total Protein: 6.5 g/dL (ref 6.0–8.3)

## 2023-05-08 LAB — CBC WITH DIFFERENTIAL/PLATELET
Basophils Absolute: 0.1 10*3/uL (ref 0.0–0.1)
Basophils Relative: 0.7 % (ref 0.0–3.0)
Eosinophils Absolute: 0.1 10*3/uL (ref 0.0–0.7)
Eosinophils Relative: 1.3 % (ref 0.0–5.0)
HCT: 49.2 % (ref 39.0–52.0)
Hemoglobin: 16.6 g/dL (ref 13.0–17.0)
Lymphocytes Relative: 13.1 % (ref 12.0–46.0)
Lymphs Abs: 1.1 10*3/uL (ref 0.7–4.0)
MCHC: 33.7 g/dL (ref 30.0–36.0)
MCV: 97.4 fL (ref 78.0–100.0)
Monocytes Absolute: 0.7 10*3/uL (ref 0.1–1.0)
Monocytes Relative: 8.2 % (ref 3.0–12.0)
Neutro Abs: 6.3 10*3/uL (ref 1.4–7.7)
Neutrophils Relative %: 76.7 % (ref 43.0–77.0)
Platelets: 165 10*3/uL (ref 150.0–400.0)
RBC: 5.06 Mil/uL (ref 4.22–5.81)
RDW: 13.9 % (ref 11.5–15.5)
WBC: 8.2 10*3/uL (ref 4.0–10.5)

## 2023-05-08 LAB — URIC ACID: Uric Acid, Serum: 6.3 mg/dL (ref 4.0–7.8)

## 2023-05-08 MED ORDER — COLCHICINE 0.6 MG PO TABS: 0.6000 mg | ORAL_TABLET | Freq: Two times a day (BID) | ORAL | 0 refills | Status: DC | PRN

## 2023-05-08 NOTE — Assessment & Plan Note (Addendum)
Pain/swelling/redness and heat of right great toe MTP joint  Exam consistent with gout Xray ordered =some deg changes at MTP joint (of note some erosions seen lateral at 5th MT head)  Labs ordered  On hydrochlorothiazide-instructed to hold this and work on fluid intake Handouts given for gout and low purine diet   Prescription for colchiine bid prn for symptoms with food (discussed possible side effects)  For now will take this instead of meloxicam   Follow up 2 weeks

## 2023-05-08 NOTE — Progress Notes (Signed)
Subjective:    Patient ID: Austin Knapp, male    DOB: [birth year], 77 y.o.   MRN: [id number]  HPI  Wt Readings from Last 3 Encounters:  05/08/23 196 lb (88.9 kg)  03/18/23 190 lb (86.2 kg)  01/10/19 198 lb 1 oz (89.8 kg)   28.94 kg/m  Vitals:   05/08/23 1155  BP: 124/70  Pulse: 80  Resp: 16  Temp: 98 F (36.7 C)  SpO2: 97%     Pt presents with c/o gout symptoms   Right foot is bothering him Heel is tender   Starting Friday right great toe has been extremely painful  Red and swollen  Very tender to touch (even in bed)  Sometimes throbbing  Brought a picture from yesterday   He went on web MD- thinks he may have gout   Took meloxicam this am at 9  (15 mg)  Has prescription from [place] -and takes as needed   Got a little dehydrated working polls  Better now  Needs more water    Had tongue cancer Seen at [facility]  Does not drink etoh   HTN bp is stable today  No cp or palpitations or headaches or edema  No side effects to medicines  BP Readings from Last 3 Encounters:  05/08/23 124/70  03/27/23 128/81  03/23/23 119/77    Hydrochlorothiazide 12.5 mg daily    Last labs April at [facility]      Lab Results  Component Value Date   NA 140 01/04/2019   K 4.9 01/04/2019   CO2 32 01/04/2019   GLUCOSE 97 01/04/2019   BUN 24 (H) 01/04/2019   CREATININE 0.95 01/04/2019   CALCIUM 9.6 01/04/2019   GFR 77.89 01/04/2019   GFRNONAA >60 03/08/2012   Film today  DG Foot Complete Right  Result Date: 05/08/2023 CLINICAL DATA:  Pain, swelling, and redness of first metatarsophalangeal joint of right foot. Suspect gout. EXAM: RIGHT FOOT COMPLETE - 3+ VIEW COMPARISON:  None Available. FINDINGS: Minimal degenerative spurring at the lateral aspect of the great toe metatarsophalangeal joint. Moderate chronic enthesopathic change at the Achilles insertion on the calcaneus. Chronic 5 x 1 mm ossicle just dorsal to the talar neck, likely the sequela of remote trauma. Mild  lateral greater than medial fifth metatarsal head lucent erosions. No erosions at the great toe metatarsophalangeal joint. No acute fracture or dislocation. IMPRESSION: 1. Mild lateral greater than medial fifth metatarsal head lucent erosions. This can be seen with inflammatory arthropathy including rheumatoid arthritis and gout. 2. Minimal degenerative spurring at the lateral aspect of the great toe metatarsophalangeal joint. No erosions at the great toe metatarsophalangeal joint to indicate most classic radiographic evidence of gout. Electronically Signed   By: Neita Garnet M.D.   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   Hypertension    Testosterone deficiency    Past Medical History:  Diagnosis Date   Actinic keratosis    Hypertension    Low testosterone    Sleep apnea    Past Surgical History:  Procedure  Laterality Date   COLONOSCOPY WITH PROPOFOL N/A 01/19/2015   Procedure: COLONOSCOPY WITH PROPOFOL;  Surgeon: Scot Jun, MD;  Location: [facility] ENDOSCOPY;  Service: Endoscopy;  Laterality: N/A;   HERNIA REPAIR Right 08/2012   MASTOIDECTOMY     tongue excision     04/2012   VASECTOMY     Social History   Tobacco Use   Smoking status: Former   Smokeless tobacco: Never  Vaping Use   Vaping status: Never Used  Substance Use Topics   Alcohol use: Yes    Comment: occassional   Drug use: No   Family History  Problem Relation Age of Onset   Macular degeneration Mother    Cancer Father    AAA (abdominal aortic aneurysm) Father    AAA (abdominal aortic aneurysm) Sister     Allergies  Allergen Reactions   Clindamycin/Lincomycin Rash   Latex    Current Outpatient Medications on File Prior to Visit  Medication Sig Dispense Refill   ANDROGEL PUMP 20.25 MG/ACT (1.62%) GEL APPLY 4 PUMPS ONCE DAILY AS DIRECTED. 450 g 0   aspirin 81 MG tablet Take 81 mg by mouth daily.     Cholecalciferol (VITAMIN D3) 2000 units TABS Take 1 capsule by mouth.     doxazosin (CARDURA) 8 MG tablet Take 8 mg by mouth daily.     fluticasone (FLONASE) 50 MCG/ACT nasal spray instill 1 spray into each nostril once daily 16 g 1   hydrochlorothiazide (MICROZIDE) 12.5 MG capsule Take 1 capsule (12.5 mg total) by mouth daily. 90 capsule 2   levothyroxine (SYNTHROID) 25 MCG tablet Take 25 mcg by mouth daily before breakfast.     Multiple Vitamin (MULTIVITAMIN) tablet Take 1 tablet by mouth daily.     [DISCONTINUED] lisinopril-hydrochlorothiazide (PRINZIDE,ZESTORETIC) 10-12.5 MG per tablet Take 1 tablet by mouth daily.     No current facility-administered medications on file prior to visit.    Review of Systems  Constitutional:  Negative for activity change, appetite change, fatigue, fever and unexpected weight change.  HENT:  Negative for congestion, rhinorrhea, sore throat and trouble swallowing.   Eyes:  Negative for pain, redness, itching and visual disturbance.  Respiratory:  Negative for cough, chest tightness, shortness of breath and wheezing.   Cardiovascular:  Negative for chest pain and palpitations.  Gastrointestinal:  Negative for abdominal pain, blood in stool, constipation, diarrhea and nausea.  Endocrine: Negative for cold intolerance, heat intolerance, polydipsia and polyuria.  Genitourinary:  Negative for difficulty urinating, dysuria, frequency and urgency.  Musculoskeletal:  Positive for arthralgias. Negative for joint swelling and myalgias.  Skin:  Negative for pallor and rash.  Neurological:  Negative for dizziness, tremors, weakness, numbness and headaches.   Hematological:  Negative for adenopathy. Does not bruise/bleed easily.  Psychiatric/Behavioral:  Negative for decreased concentration and dysphoric mood. The patient is not nervous/anxious.        Objective:   Physical Exam Constitutional:      General: He is not in acute distress.    Appearance: Normal appearance. He is well-developed. He is not ill-appearing or diaphoretic.  HENT:     Head: Normocephalic and atraumatic.  Eyes:     Conjunctiva/sclera: Conjunctivae normal.     Pupils: Pupils are equal, round, and reactive to light.  Neck:     Thyroid: No thyromegaly.     Vascular: No carotid bruit or JVD.  Cardiovascular:     Rate and Rhythm: Normal rate and regular rhythm.     Heart sounds:  Normal heart sounds.     No gallop.  Pulmonary:     Effort: Pulmonary effort is normal. No respiratory distress.     Breath sounds: Normal breath sounds.  Abdominal:     General: There is no distension or abdominal bruit.     Palpations: Abdomen is soft.  Musculoskeletal:     Cervical back: Normal range of motion and neck supple.     Right lower leg: No edema.     Left lower leg: No edema.     Comments: Right great toe (MTP) joint is erythematous/warm and swollen  Tender to touch  Limited rom due to pain   No fluctuance or rash   No calcaneous tenderness  Lymphadenopathy:     Cervical: No cervical adenopathy.  Skin:    General: Skin is warm and dry.     Coloration: Skin is not pale.     Findings: No rash.  Neurological:     Mental Status: He is alert.     Coordination: Coordination normal.     Deep Tendon Reflexes: Reflexes are normal and symmetric. Reflexes normal.  Psychiatric:        Mood and Affect: Mood normal.           Assessment & Plan:   Problem List Items Addressed This Visit       Cardiovascular and Mediastinum   Hypertension    bp in fair control at this time  BP Readings from Last 1 Encounters:  05/08/23 124/70   Taking hydrochlorothiazide 12.5  mg -instructed to hold this in light of gout  Most recent labs reviewed , labs ordered  Disc lifstyle change with low sodium diet and exercise   Follow up approx 2 wk for re check of blood pressure off medication        Relevant Medications   doxazosin (CARDURA) 8 MG tablet   Other Relevant Orders   CBC with Differential/Platelet     Digestive   Tongue cancer (HCC)    Conitnues to be monitored at [facility]       Relevant Medications   colchicine 0.6 MG tablet     Other   Heel pain    Right foot  Worse after inactivity  Then improves after moving Suspect plantar fascititis  Given handout - encouraged use of ice and solid shoes   Update if not starting to improve in a week or if worsening        Gout - Primary    Pain/swelling/redness and heat of right great toe MTP joint  Exam consistent with gout Xray ordered =some deg changes at MTP joint (of note some erosions seen lateral at 5th MT head)  Labs ordered  On hydrochlorothiazide-instructed to hold this and work on fluid intake Handouts given for gout and low purine diet   Prescription for colchiine bid prn for symptoms with food (discussed possible side effects)  For now will take this instead of meloxicam   Follow up 2 weeks       Relevant Orders   DG Foot Complete Right (Completed)   Comprehensive metabolic panel   Uric Acid   CBC with Differential/Platelet

## 2023-05-08 NOTE — Assessment & Plan Note (Signed)
Right foot  Worse after inactivity  Then improves after moving Suspect plantar fascititis  Given handout - encouraged use of ice and solid shoes   Update if not starting to improve in a week or if worsening

## 2023-05-08 NOTE — Assessment & Plan Note (Signed)
bp in fair control at this time  BP Readings from Last 1 Encounters:  05/08/23 124/70   Taking hydrochlorothiazide 12.5 mg -instructed to hold this in light of gout  Most recent labs reviewed , labs ordered  Disc lifstyle change with low sodium diet and exercise   Follow up approx 2 wk for re check of blood pressure off medication

## 2023-05-08 NOTE — Telephone Encounter (Signed)
Patient has been scheduled office visit today.  No further action needed at this time.

## 2023-05-08 NOTE — Patient Instructions (Addendum)
Drink fluids / more water  Hold the hydrochlorothiazide for now  Follow up in 2 weeks approx for blood pressure   Take a look at the handouts    Xray now  Labs now   Try colchicine for gout  Don't mix with the meloxicam   Elevate foot as much as you can    The heel pain may be from plantar fasciitis Take a look at the handout

## 2023-05-08 NOTE — Assessment & Plan Note (Signed)
Conitnues to be monitored at [facility]

## 2023-05-22 ENCOUNTER — Encounter: Payer: Self-pay | Admitting: Family Medicine

## 2023-05-22 ENCOUNTER — Ambulatory Visit (INDEPENDENT_AMBULATORY_CARE_PROVIDER_SITE_OTHER): Payer: Medicare PPO | Admitting: Family Medicine

## 2023-05-22 VITALS — BP 113/80 | HR 68 | Temp 97.8°F | Ht 69.0 in | Wt 194.5 lb

## 2023-05-22 DIAGNOSIS — R768 Other specified abnormal immunological findings in serum: Secondary | ICD-10-CM

## 2023-05-22 DIAGNOSIS — M109 Gout, unspecified: Secondary | ICD-10-CM | POA: Diagnosis not present

## 2023-05-22 DIAGNOSIS — I10 Essential (primary) hypertension: Secondary | ICD-10-CM

## 2023-05-22 DIAGNOSIS — M25571 Pain in right ankle and joints of right foot: Secondary | ICD-10-CM

## 2023-05-22 DIAGNOSIS — M2550 Pain in unspecified joint: Secondary | ICD-10-CM | POA: Insufficient documentation

## 2023-05-22 DIAGNOSIS — M79671 Pain in right foot: Secondary | ICD-10-CM | POA: Diagnosis not present

## 2023-05-22 LAB — URIC ACID: Uric Acid, Serum: 7.2 mg/dL (ref 4.0–7.8)

## 2023-05-22 LAB — SEDIMENTATION RATE: Sed Rate: 3 mm/h (ref 0–20)

## 2023-05-22 NOTE — Assessment & Plan Note (Signed)
bp in fair control at this time without any medication  BP Readings from Last 1 Encounters:  05/22/23 113/80  Off hydrochlorothiazide due to gout  No changes needed right now  Most recent labs reviewed  Disc lifstyle change with low sodium diet and exercise

## 2023-05-22 NOTE — Assessment & Plan Note (Signed)
On xray during flare of (likely) gout, erosions seen in lasterl 5th metatarsal head  ? 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

## 2023-05-22 NOTE — Assessment & Plan Note (Signed)
On xr some chronic enthesopathic change at achilles insertion  This may be source of pain plus/minus plantar fasciitis  Wearing a shoe insert that helps significantly now   Will continue to follow

## 2023-05-22 NOTE — Progress Notes (Unsigned)
Subjective:    Patient ID: Austin Knapp, male    DOB: 1945/09/04, 77 y.o.   MRN: [id number]  HPI  Wt Readings from Last 3 Encounters:  05/22/23 194 lb 8 oz (88.2 kg)  05/08/23 196 lb (88.9 kg)  03/18/23 190 lb (86.2 kg)   28.72 kg/m  Vitals:   05/22/23 0942 05/22/23 1003  BP: (!) 134/92 113/80  Pulse: 68   Temp: 97.8 F (36.6 C)   SpO2: 95%     Pt presents for follow up of HTN and gout    Last visit we held hydrochlorothiazide for a gout flare Encouraged a good fluid intake   No cp or palpitations or headaches or edema  No side effects to medicines  BP Readings from Last 3 Encounters:  05/22/23 113/80  05/08/23 124/70  03/27/23 128/81     Hat gout of right first MTP joint We prescription colchicine   Spent 2 d on crutches  Very swollen and red for a few days  Then got better on colchicine   Lab Results  Component Value Date   NA 138 05/08/2023   K 4.4 05/08/2023   CO2 27 05/08/2023   GLUCOSE 89 05/08/2023   BUN 20 05/08/2023   CREATININE 1.01 05/08/2023   CALCIUM 9.2 05/08/2023   GFR 72.24 05/08/2023   GFRNONAA >60 03/08/2012   Lab Results  Component Value Date   ALT 20 05/08/2023   AST 19 05/08/2023   ALKPHOS 48 05/08/2023   BILITOT 1.0 05/08/2023   Lab Results  Component Value Date   WBC 8.2 05/08/2023   HGB 16.6 05/08/2023   HCT 49.2 05/08/2023   MCV 97.4 05/08/2023   PLT 165.0 05/08/2023   Lab Results  Component Value Date   LABURIC 6.3 05/08/2023      DG Foot Complete Right  Result Date: 05/08/2023 CLINICAL DATA:  Pain, swelling, and redness of first metatarsophalangeal joint of right foot. Suspect gout. EXAM: RIGHT FOOT COMPLETE - 3+ VIEW COMPARISON:  None Available. FINDINGS: Minimal degenerative spurring at the lateral aspect of the great toe metatarsophalangeal joint. Moderate chronic enthesopathic change at the Achilles insertion on the calcaneus. Chronic 5 x 1 mm ossicle just dorsal to the talar neck, likely the sequela of  remote trauma. Mild lateral greater than medial fifth metatarsal head lucent erosions. No erosions at the great toe metatarsophalangeal joint. No acute fracture or dislocation. IMPRESSION: 1. Mild lateral greater than medial fifth metatarsal head lucent erosions. This can be seen with inflammatory arthropathy including rheumatoid arthritis and gout. 2. Minimal degenerative spurring at the lateral aspect of the great toe metatarsophalangeal joint. No erosions at the great toe metatarsophalangeal joint to indicate most classic radiographic evidence of gout. Electronically Signed   By: Neita Garnet M.D.   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  Hypertension    Testosterone deficiency    Past Medical History:  Diagnosis Date   Actinic keratosis    Hypertension    Low testosterone    Sleep apnea    Past Surgical History:  Procedure Laterality Date   COLONOSCOPY WITH PROPOFOL N/A 01/19/2015   Procedure: COLONOSCOPY WITH PROPOFOL;  Surgeon: Scot Jun, MD;  Location: [facility] ENDOSCOPY;  Service: Endoscopy;  Laterality: N/A;   HERNIA REPAIR Right 08/2012   MASTOIDECTOMY     tongue excision     04/2012   VASECTOMY     Social History   Tobacco Use   Smoking status: Former   Smokeless tobacco: Never  Vaping Use   Vaping status: Never Used  Substance Use Topics   Alcohol use: Yes    Comment: occassional   Drug use:  No   Family History  Problem Relation Age of Onset   Macular degeneration Mother    Cancer Father    AAA (abdominal aortic aneurysm) Father    AAA (abdominal aortic aneurysm) Sister    Allergies  Allergen Reactions   Clindamycin/Lincomycin Rash   Latex    Current Outpatient Medications on File Prior to Visit  Medication Sig Dispense Refill   ANDROGEL PUMP 20.25 MG/ACT (1.62%) GEL APPLY 4 PUMPS ONCE DAILY AS DIRECTED. 450 g 0   aspirin 81 MG tablet Take 81 mg by mouth daily.     Cholecalciferol (VITAMIN D3) 2000 units TABS Take 1 capsule by mouth.     doxazosin (CARDURA) 8 MG tablet Take 8 mg by mouth daily.     fluticasone (FLONASE) 50 MCG/ACT nasal spray instill 1 spray into each nostril once daily 16 g 1   hydrochlorothiazide (MICROZIDE) 12.5 MG capsule Take 1 capsule (12.5 mg total) by mouth daily. 90 capsule 2   levothyroxine (SYNTHROID) 25 MCG tablet Take 25 mcg by mouth daily before breakfast.     Multiple Vitamin (MULTIVITAMIN) tablet Take 1 tablet by mouth daily.     [DISCONTINUED] lisinopril-hydrochlorothiazide (PRINZIDE,ZESTORETIC) 10-12.5 MG per tablet Take 1 tablet by mouth daily.     No current facility-administered medications on file prior to visit.    Review of Systems  Constitutional:  Negative for activity change, appetite change, fatigue, fever and unexpected weight change.  HENT:  Negative for congestion, rhinorrhea, sore throat and trouble swallowing.   Eyes:  Negative for pain, redness, itching and visual disturbance.  Respiratory:  Negative for cough, chest tightness, shortness of breath and wheezing.   Cardiovascular:  Negative for chest pain and palpitations.  Gastrointestinal:  Negative for abdominal pain, blood in stool, constipation, diarrhea and nausea.  Endocrine: Negative for cold intolerance, heat intolerance, polydipsia and polyuria.  Genitourinary:  Negative for difficulty urinating, dysuria, frequency and urgency.  Musculoskeletal:  Positive  for arthralgias. Negative for joint swelling and myalgias.  Skin:  Negative for pallor and rash.  Neurological:  Negative for dizziness, tremors, weakness, numbness and headaches.  Hematological:  Negative for adenopathy. Does not bruise/bleed easily.  Psychiatric/Behavioral:  Negative for decreased concentration and dysphoric mood. The patient is not nervous/anxious.        Objective:   Physical Exam Constitutional:      General: He is not in acute distress.    Appearance: Normal appearance. He is well-developed and normal weight. He is not ill-appearing or diaphoretic.  HENT:     Head: Normocephalic and atraumatic.  Eyes:     Conjunctiva/sclera: Conjunctivae normal.     Pupils: Pupils are equal,  round, and reactive to light.  Neck:     Thyroid: No thyromegaly.     Vascular: No carotid bruit or JVD.  Cardiovascular:     Rate and Rhythm: Normal rate and regular rhythm.     Heart sounds: Normal heart sounds.     No gallop.  Pulmonary:     Effort: Pulmonary effort is normal. No respiratory distress.     Breath sounds: Normal breath sounds. No wheezing or rales.  Abdominal:     General: There is no distension or abdominal bruit.     Palpations: Abdomen is soft.  Musculoskeletal:     Cervical back: Normal range of motion and neck supple.     Right lower leg: No edema.     Left lower leg: No edema.     Comments: Right foot-less swollen No erythema or warmth Normal rom all joints  Some tenderness of MTPs on compression   Some tenderness of posterior heel   Lymphadenopathy:     Cervical: No cervical adenopathy.  Skin:    General: Skin is warm and dry.     Coloration: Skin is not pale.     Findings: No rash.  Neurological:     Mental Status: He is alert.     Coordination: Coordination normal.     Deep Tendon Reflexes: Reflexes are normal and symmetric. Reflexes normal.  Psychiatric:        Mood and Affect: Mood normal.           Assessment & Plan:   Problem List  Items Addressed This Visit       Cardiovascular and Mediastinum   Hypertension    bp in fair control at this time without any medication  BP Readings from Last 1 Encounters:  05/22/23 113/80  Off hydrochlorothiazide due to gout  No changes needed right now  Most recent labs reviewed  Disc lifstyle change with low sodium diet and exercise          Other   Joint pain    On xray during flare of (likely) gout, erosions seen in lasterl 5th metatarsal head  ? From gout or other  OA signs in first MTP  No personal history of auto immune dz in past    Labs today for auto immune joint  Reviewed last labs   Referral to rheumatology planned       Relevant Orders   ANA   Rheumatoid factor   Uric Acid   Sedimentation Rate   Heel pain    On xr some chronic enthesopathic change at achilles insertion  This may be source of pain plus/minus plantar fasciitis  Wearing a shoe insert that helps significantly now   Will continue to follow         Gout - Primary    Right great toe was affected last time Xr of right foot showed erosions in right 5th MTP joint interestingly  Uric acid normal during flare  Colchicine helped a lot -has on hand for use in future    Labs for joint erosions today  Then ref to rheumatology       Relevant Orders   Uric Acid

## 2023-05-22 NOTE — Patient Instructions (Addendum)
Labs today for joint changes and gout   I plan to refer you to rheumatology as well  Please let us know if you don't hear in 1-2 weeks   Take care of yourself   Stay hydrated   Blood pressure is ok for now

## 2023-05-22 NOTE — Assessment & Plan Note (Signed)
Right great toe was affected last time Xr of right foot showed erosions in right 5th MTP joint interestingly  Uric acid normal during flare  Colchicine helped a lot -has on hand for use in future    Labs for joint erosions today  Then ref to rheumatology

## 2023-05-24 DIAGNOSIS — R768 Other specified abnormal immunological findings in serum: Secondary | ICD-10-CM | POA: Insufficient documentation

## 2023-05-24 LAB — ANA: Anti Nuclear Antibody (ANA): POSITIVE — AB

## 2023-05-24 LAB — RHEUMATOID FACTOR: Rheumatoid fact SerPl-aCnc: <10 [IU]/mL (ref <14)

## 2023-05-24 LAB — ANTI-NUCLEAR AB-TITER (ANA TITER): ANA Titer 1: 1:40 {titer} — ABNORMAL HIGH

## 2023-05-24 NOTE — Addendum Note (Signed)
Addended by: Roxy Manns A on: 05/24/2023 07:45 PM   Modules accepted: Orders

## 2023-05-24 NOTE — Assessment & Plan Note (Signed)
1:40 Speckled  Some erosive joint changes on foot xray Also gout possibly   Ref to rheum

## 2023-06-15 ENCOUNTER — Ambulatory Visit (INDEPENDENT_AMBULATORY_CARE_PROVIDER_SITE_OTHER): Payer: Medicare PPO

## 2023-06-15 VITALS — BP 114/70 | Ht 69.0 in | Wt 201.6 lb

## 2023-06-15 DIAGNOSIS — Z0000 Encounter for general adult medical examination without abnormal findings: Secondary | ICD-10-CM

## 2023-06-15 NOTE — Progress Notes (Signed)
 Subjective:   Austin Knapp is a 78 y.o. male who presents for Medicare Annual/Subsequent preventive examination.  Visit Complete: In person  Patient Medicare AWV questionnaire was completed by the patient on 06/11/2023; I have confirmed that all information answered by patient is correct and no changes since this date.  Cardiac Risk Factors include: advanced age (>14men, >13 women);hypertension;male gender;sedentary lifestyle    Objective:    Today's Vitals   06/15/23 1508  BP: 114/70  Weight: 201 lb 9.6 oz (91.4 kg)  Height: 5' 9 (1.753 m)  PainSc: 0-No pain   Body mass index is 29.77 kg/m.     06/15/2023    3:20 PM 03/27/2023    9:14 AM 03/18/2023    4:05 PM 01/04/2019    9:51 AM 10/06/2015    8:38 AM 01/19/2015    1:15 PM  Advanced Directives  Does Patient Have a Medical Advance Directive? No;Yes Yes Yes No Yes Yes  Type of Estate Agent of [place];Living will Healthcare Power of [employer] Power of [employer] Power of [place];Living will Out of facility DNR (pink MOST or yellow form)  Does patient want to make changes to medical advance directive?   No - Patient declined  No - Patient declined   Copy of Healthcare Power of Attorney in Chart? No - copy requested    No - copy requested No - copy requested    Current Medications (verified) Outpatient Encounter Medications as of 06/15/2023  Medication Sig   ANDROGEL  PUMP 20.25 MG/ACT (1.62%) GEL APPLY 4 PUMPS ONCE DAILY AS DIRECTED.   aspirin 81 MG tablet Take 81 mg by mouth daily.   Cholecalciferol (VITAMIN D3) 2000 units TABS Take 1 capsule by mouth.   doxazosin (CARDURA) 8 MG tablet Take 8 mg by mouth daily.   fluticasone  (FLONASE ) 50 MCG/ACT nasal spray instill 1 spray into each nostril once daily   hydrochlorothiazide  (MICROZIDE ) 12.5 MG capsule Take 1 capsule (12.5 mg total) by mouth daily.   levothyroxine (SYNTHROID) 25 MCG tablet Take 25 mcg by mouth daily before breakfast.    Multiple Vitamin (MULTIVITAMIN) tablet Take 1 tablet by mouth daily.   [DISCONTINUED] lisinopril-hydrochlorothiazide  (PRINZIDE,ZESTORETIC) 10-12.5 MG per tablet Take 1 tablet by mouth daily.   No facility-administered encounter medications on file as of 06/15/2023.   Allergies (verified) Clindamycin/lincomycin and Latex   History: Past Medical History:  Diagnosis Date   Actinic keratosis    Hypertension    Low testosterone     Sleep apnea    Past Surgical History:  Procedure Laterality Date   COLONOSCOPY WITH PROPOFOL  N/A 01/19/2015   Procedure: COLONOSCOPY WITH PROPOFOL ;  Surgeon: Lamar ONEIDA Holmes, MD;  Location: [facility] ENDOSCOPY;  Service: Endoscopy;  Laterality: N/A;   HERNIA REPAIR Right 08/2012   MASTOIDECTOMY     tongue excision     04/2012   VASECTOMY     Family History  Problem Relation Age of Onset   Macular degeneration Mother    Cancer Father    AAA (abdominal aortic aneurysm) Father    AAA (abdominal aortic aneurysm) Sister    Social History   Socioeconomic History   Marital status: Widowed    Spouse name: Not on file   Number of children: Not on file   Years of education: Not on file   Highest education level: Professional school degree (e.g., MD, DDS, DVM, JD)  Occupational History   Not on file  Tobacco Use   Smoking status: Former  Smokeless tobacco: Never  Vaping Use   Vaping status: Never Used  Substance and Sexual Activity   Alcohol use: Yes    Comment: occassional   Drug use: No   Sexual activity: Yes  Other Topics Concern   Not on file  Social History Narrative   Patients desires CPR.   Has HPOA.   Social Drivers of Corporate Investment Banker Strain: Low Risk  (06/15/2023)   Overall Financial Resource Strain (CARDIA)    Difficulty of Paying Living Expenses: Not hard at all  Food Insecurity: No Food Insecurity (06/15/2023)   Hunger Vital Sign    Worried About Running Out of Food in the Last Year: Never true    Ran Out of Food in the Last  Year: Never true  Transportation Needs: No Transportation Needs (06/15/2023)   PRAPARE - Administrator, Civil Service (Medical): No    Lack of Transportation (Non-Medical): No  Physical Activity: Inactive (06/15/2023)   Exercise Vital Sign    Days of Exercise per Week: 0 days    Minutes of Exercise per Session: 0 min  Stress: Stress Concern Present (06/15/2023)   [employer] of Occupational Health - Occupational Stress Questionnaire    Feeling of Stress : To some extent  Social Connections: Socially Isolated (06/15/2023)   Social Connection and Isolation Panel [NHANES]    Frequency of Communication with Friends and Family: More than three times a week    Frequency of Social Gatherings with Friends and Family: More than three times a week    Attends Religious Services: Never    Database Administrator or Organizations: No    Attends Banker Meetings: Never    Marital Status: Widowed    Tobacco Counseling Counseling given: Not Answered  Clinical Intake:  Pre-visit preparation completed: Yes  Pain : No/denies pain Pain Score: 0-No pain    BMI - recorded: 29.77 Nutritional Status: BMI 25 -29 Overweight Nutritional Risks: None Diabetes: No  How often do you need to have someone help you when you read instructions, pamphlets, or other written materials from your doctor or pharmacy?: 1 - Never  Interpreter Needed?: No  Comments: lives alone Information entered by :: B.Fred Hammes,LPN   Activities of Daily Living    06/11/2023    9:03 AM  In your present state of health, do you have any difficulty performing the following activities:  Hearing? 1  Vision? 1  Difficulty concentrating or making decisions? 0  Walking or climbing stairs? 0  Dressing or bathing? 0  Doing errands, shopping? 0  Preparing Food and eating ? N  Using the Toilet? N  In the past six months, have you accidently leaked urine? N  Do you have problems with loss of bowel control?  N  Managing your Medications? N  Managing your Finances? N  Housekeeping or managing your Housekeeping? N    Patient Care Team: Tower, Laine LABOR, MD as PCP - General (Family Medicine) Tonna Anes, MD as Referring Physician (Hematology and Oncology) Pressley George K, PA-C as Consulting Physician (Physician Assistant) Burnie Donzell Hollow, MD as Referring Physician (Ophthalmology)  Indicate any recent Medical Services you may have received from other than [facility] providers in the past year (date may be approximate).     Assessment:   This is a routine wellness examination for Jamiel.  Hearing/Vision screen Hearing Screening - Comments:: Pt says his hearing is not as good; hearing loss;has hearing aids but does not wear them  much  Vision Screening - Comments:: Pt says vision is hazy :has cataracts Dr Mittie   Goals Addressed             This Visit's Progress    COMPLETED: Weight (lb) < 200 lb (90.7 kg)   201 lb 9.6 oz (91.4 kg)    01/04/2019, wants to weigh 185-190.       Depression Screen    06/15/2023    3:17 PM 05/22/2023    9:53 AM 05/08/2023   12:01 PM 01/04/2019    9:53 AM 09/12/2017   11:02 AM 10/06/2015    8:59 AM 08/14/2014    9:08 AM  PHQ 2/9 Scores  PHQ - 2 Score 1 2 2  0 0 0 0  PHQ- 9 Score  5 8 3 2       Fall Risk    06/11/2023    9:03 AM 05/22/2023    9:53 AM 05/08/2023   11:55 AM 01/04/2019    9:53 AM 09/12/2017   11:02 AM  Fall Risk   Falls in the past year? 0 0 0 0 No  Number falls in past yr: 0 0 0    Injury with Fall? 0 0 0    Risk for fall due to : No Fall Risks No Fall Risks No Fall Risks;Orthopedic patient Medication side effect   Follow up Education provided;Falls prevention discussed Falls evaluation completed Falls evaluation completed Falls evaluation completed;Falls prevention discussed     MEDICARE RISK AT HOME: Medicare Risk at Home Any stairs in or around the home?: (Patient-Rptd) No Home free of loose throw rugs in walkways, pet  beds, electrical cords, etc?: (Patient-Rptd) No Adequate lighting in your home to reduce risk of falls?: (Patient-Rptd) Yes Life alert?: (Patient-Rptd) No Use of a cane, walker or w/c?: (Patient-Rptd) No Grab bars in the bathroom?: (Patient-Rptd) No Shower chair or bench in shower?: (Patient-Rptd) No Elevated toilet seat or a handicapped toilet?: (Patient-Rptd) No  TIMED UP AND GO:  Was the test performed?  Yes  Length of time to ambulate 10 feet: 10 sec Gait steady and fast without use of assistive device    Cognitive Function:    01/04/2019    9:56 AM 10/06/2015    8:39 AM  MMSE - Mini Mental State Exam  Orientation to time 5 5  Orientation to Place 5 5  Registration 3 3  Attention/ Calculation 5 0  Recall 3 3  Language- name 2 objects 0 0  Language- repeat 0 1  Language- follow 3 step command 0 3  Language- read & follow direction 1 0  Write a sentence 0 0  Copy design 0 0  Total score 22 20        06/15/2023    3:39 PM  6CIT Screen  What Year? 0 points  What month? 0 points  What time? 0 points  Count back from 20 0 points  Months in reverse 0 points  Repeat phrase 0 points  Total Score 0 points    Immunizations Immunization History  Administered Date(s) Administered   Influenza, High Dose Seasonal PF 02/12/2016, 03/03/2017, 02/21/2022, 04/19/2023   Influenza-Unspecified 03/13/2014, 02/11/2018   Pneumococcal Conjugate-13 12/17/2013   Pneumococcal Polysaccharide-23 03/25/2010, 10/09/2015   Tdap 10/09/2015, 03/18/2023   Zoster Recombinant(Shingrix) 11/08/2016, 02/06/2017   Zoster, Live 01/27/2010    TDAP status: Up to date  Flu Vaccine status: Up to date  Pneumococcal vaccine status: Up to date  Covid-19 vaccine status: Declined, Education has been provided  regarding the importance of this vaccine but patient still declined. Advised may receive this vaccine at local pharmacy or Health Dept.or vaccine clinic. Aware to provide a copy of the vaccination  record if obtained from local pharmacy or Health Dept. Verbalized acceptance and understanding.  Qualifies for Shingles Vaccine? Yes   Zostavax completed Yes   Shingrix Completed?: Yes  Screening Tests Health Maintenance  Topic Date Due   COVID-19 Vaccine (4 - 2024-25 season) 09/11/2023 (Originally 02/12/2023)   Medicare Annual Wellness (AWV)  06/14/2024   DTaP/Tdap/Td (3 - Td or Tdap) 03/17/2033   Pneumonia Vaccine 19+ Years old  Completed   INFLUENZA VACCINE  Completed   Hepatitis C Screening  Completed   Zoster Vaccines- Shingrix  Completed   HPV VACCINES  Aged Out   Colonoscopy  Discontinued    Health Maintenance  There are no preventive care reminders to display for this patient.   Colorectal cancer screening: No longer required.   Lung Cancer Screening: (Low Dose CT Chest recommended if Age 2-80 years, 20 pack-year currently smoking OR have quit w/in 15years.) does not qualify.   Lung Cancer Screening Referral: no  Additional Screening:  Hepatitis C Screening: does not qualify; Completed 10/06/2015  Vision Screening: Recommended annual ophthalmology exams for early detection of glaucoma and other disorders of the eye. Is the patient up to date with their annual eye exam?  Yes  Who is the provider or what is the name of the office in which the patient attends annual eye exams? Dr Mittie If pt is not established with a provider, would they like to be referred to a provider to establish care? No .   Dental Screening: Recommended annual dental exams for proper oral hygiene  Diabetic Foot Exam: n/a  Community Resource Referral / Chronic Care Management: CRR required this visit?  No   CCM required this visit?  Appt scheduled with PCP    Plan:     I have personally reviewed and noted the following in the patient's chart:   Medical and social history Use of alcohol, tobacco or illicit drugs  Current medications and supplements including opioid prescriptions.  Patient is not currently taking opioid prescriptions. Functional ability and status Nutritional status Physical activity Advanced directives List of other physicians Hospitalizations, surgeries, and ER visits in previous 12 months Vitals Screenings to include cognitive, depression, and falls Referrals and appointments  In addition, I have reviewed and discussed with patient certain preventive protocols, quality metrics, and best practice recommendations. A written personalized care plan for preventive services as well as general preventive health recommendations were provided to patient.   Erminio LITTIE Saris, LPN   01/17/7973   After Visit Summary: (MyChart) Due to this being a telephonic visit, the after visit summary with patients personalized plan was offered to patient via MyChart   Nurse Notes: The patient states they are doing well and has no concerns or questions at this time.

## 2023-06-15 NOTE — Patient Instructions (Signed)
 Mr. Bergquist , Thank you for taking time to come for your Medicare Wellness Visit. I appreciate your ongoing commitment to your health goals. Please review the following plan we discussed and let me know if I can assist you in the future.   Referrals/Orders/Follow-Ups/Clinician Recommendations: none  This is a list of the screening recommended for you and due dates:  Health Maintenance  Topic Date Due   COVID-19 Vaccine (4 - 2024-25 season) 02/12/2023   Medicare Annual Wellness Visit  06/14/2024   DTaP/Tdap/Td vaccine (3 - Td or Tdap) 03/17/2033   Pneumonia Vaccine  Completed   Flu Shot  Completed   Hepatitis C Screening  Completed   Zoster (Shingles) Vaccine  Completed   HPV Vaccine  Aged Out   Colon Cancer Screening  Discontinued    Advanced directives: (Declined) Advance directive discussed with you today. Even though you declined this today, please call our office should you change your mind, and we can give you the proper paperwork for you to fill out.  Next Medicare Annual Wellness Visit scheduled for next year: Yes 06/17/2024 @ 3:40pm in person

## 2023-06-21 ENCOUNTER — Ambulatory Visit (INDEPENDENT_AMBULATORY_CARE_PROVIDER_SITE_OTHER): Payer: No Typology Code available for payment source | Admitting: Family Medicine

## 2023-06-21 ENCOUNTER — Encounter: Payer: Self-pay | Admitting: Family Medicine

## 2023-06-21 VITALS — BP 116/64 | HR 59 | Temp 97.8°F | Ht 68.5 in | Wt 199.2 lb

## 2023-06-21 DIAGNOSIS — Z85810 Personal history of malignant neoplasm of tongue: Secondary | ICD-10-CM

## 2023-06-21 DIAGNOSIS — Z0000 Encounter for general adult medical examination without abnormal findings: Secondary | ICD-10-CM

## 2023-06-21 DIAGNOSIS — I10 Essential (primary) hypertension: Secondary | ICD-10-CM | POA: Diagnosis not present

## 2023-06-21 DIAGNOSIS — E349 Endocrine disorder, unspecified: Secondary | ICD-10-CM | POA: Diagnosis not present

## 2023-06-21 DIAGNOSIS — R768 Other specified abnormal immunological findings in serum: Secondary | ICD-10-CM

## 2023-06-21 DIAGNOSIS — G4733 Obstructive sleep apnea (adult) (pediatric): Secondary | ICD-10-CM

## 2023-06-21 DIAGNOSIS — Z125 Encounter for screening for malignant neoplasm of prostate: Secondary | ICD-10-CM

## 2023-06-21 DIAGNOSIS — E039 Hypothyroidism, unspecified: Secondary | ICD-10-CM

## 2023-06-21 DIAGNOSIS — H9193 Unspecified hearing loss, bilateral: Secondary | ICD-10-CM

## 2023-06-21 DIAGNOSIS — H9190 Unspecified hearing loss, unspecified ear: Secondary | ICD-10-CM | POA: Insufficient documentation

## 2023-06-21 NOTE — Assessment & Plan Note (Addendum)
 bp in fair control at this time without any medication  BP Readings from Last 1 Encounters:  06/21/23 116/64   No changes needed right now  Takes doxazosin 8 mg daily from [place] for urology /voiding  Most recent labs reviewed  Disc lifstyle change with low sodium diet and exercise

## 2023-06-21 NOTE — Assessment & Plan Note (Signed)
 Doing well  Utd with specialty follow up

## 2023-06-21 NOTE — Assessment & Plan Note (Signed)
 Treatment by [facility] Doing well

## 2023-06-21 NOTE — Assessment & Plan Note (Signed)
 Reviewed health habits including diet and exercise and skin cancer prevention Reviewed appropriate screening tests for age  Also reviewed health mt list, fam hx and immunization status , as well as social and family history   See HPI Labs reviewed and ordered Will send for last labs from [place] Will send for last colonoscopy report (Dr Dessa) was 07/20/2020  Discussed fall prevention, supplements and exercise for bone density  Has annual dermatology care and screening  PHQ 1  Health Maintenance  Topic Date Due   COVID-19 Vaccine (4 - 2024-25 season) 09/11/2023*   Medicare Annual Wellness Visit  06/14/2024   DTaP/Tdap/Td vaccine (3 - Td or Tdap) 03/17/2033   Pneumonia Vaccine  Completed   Flu Shot  Completed   Hepatitis C Screening  Completed   Zoster (Shingles) Vaccine  Completed   HPV Vaccine  Aged Out   Colon Cancer Screening  Discontinued  *Topic was postponed. The date shown is not the original due date.

## 2023-06-21 NOTE — Assessment & Plan Note (Addendum)
 Lab Results  Component Value Date   PSA 1.13 01/04/2019   PSA 1.11 09/12/2017   PSA 0.69 10/06/2015    Pt has psa at [facility] last feb or sept? We will send for that No voiding changes/ takes doxazosin No family history

## 2023-06-21 NOTE — Assessment & Plan Note (Signed)
 Getting hearing aides from the [place]

## 2023-06-21 NOTE — Assessment & Plan Note (Signed)
 Sent for TSH from [place] No clinical changes but notes some fatigue on depression screen  Taking 25 mcg levothyroxine daily

## 2023-06-21 NOTE — Progress Notes (Signed)
 Subjective:    Patient ID: Austin Knapp, male    DOB: [birth year], 78 y.o.   MRN: [id number]  HPI  Here for health maintenance exam and to review chronic medical problems   Wt Readings from Last 3 Encounters:  06/21/23 199 lb 4 oz (90.4 kg)  06/15/23 201 lb 9.6 oz (91.4 kg)  05/22/23 194 lb 8 oz (88.2 kg)   29.86 kg/m  Vitals:   06/21/23 1009  BP: 116/64  Pulse: (!) 59  Temp: 97.8 F (36.6 C)  SpO2: 96%    Immunization History  Administered Date(s) Administered   Influenza, High Dose Seasonal PF 02/12/2016, 03/03/2017, 02/21/2022, 04/19/2023   Influenza-Unspecified 03/13/2014, 02/11/2018   Pneumococcal Conjugate-13 12/17/2013   Pneumococcal Polysaccharide-23 03/25/2010, 10/09/2015   Tdap 10/09/2015, 03/18/2023   Zoster Recombinant(Shingrix) 11/08/2016, 02/06/2017   Zoster, Live 01/27/2010    There are no preventive care reminders to display for this patient.   No more gout issues  Right heel bothers him occasionally if he stands a long time   Goes to [place] for care also     Prostate health Lab Results  Component Value Date   PSA 1.13 01/04/2019   PSA 1.11 09/12/2017   PSA 0.69 10/06/2015   Sees provider at [facility] for doxazosin 8 mg daily for voiding   Angrogel for testosterone  def   02/21/2022 1.59  Had psa 07/2022 - ? Records    Colon cancer screening  Colonoscopy 07/20/2020 with Dr Dessa  ? Polyps vs clear   ? [facility]    Bone health   Falls-one , stepped down on cinder block and cut calf on a saw   Fractures -none   Supplements -mvi   Exercise  Very active  Involved in a lot of building projects  Has a gym when he does not do the other stuff  Some treadmill     Dermatology care  Goes to derm annually  Nothing new Uses sun protectoin     Mood    06/21/2023   10:12 AM 06/15/2023    3:17 PM 05/22/2023    9:53 AM 05/08/2023   12:01 PM 01/04/2019    9:53 AM  Depression screen PHQ 2/9  Decreased Interest 0 1 1 1  0  Down,  Depressed, Hopeless 0 0 1 1 0  PHQ - 2 Score 0 1 2 2  0  Altered sleeping 0  1 2 0  Tired, decreased energy 1  1 2 3   Change in appetite 0  0 0 0  Feeling bad or failure about yourself  0  1 2 0  Trouble concentrating 0  0 0 0  Moving slowly or fidgety/restless 0  0 0 0  Suicidal thoughts 0  0 0 0  PHQ-9 Score 1  5 8 3   Difficult doing work/chores Not difficult at all  Not difficult at all Not difficult at all     HTN bp is stable today  No cp or palpitations or headaches or edema  No side effects to medicines  BP Readings from Last 3 Encounters:  06/21/23 116/64  06/15/23 114/70  05/22/23 113/80    Takes doxazosin 8 mg daily for LUTS   Family history of AAA  Lab Results  Component Value Date   NA 138 05/08/2023   K 4.4 05/08/2023   CO2 27 05/08/2023   GLUCOSE 89 05/08/2023   BUN 20 05/08/2023   CREATININE 1.01 05/08/2023   CALCIUM 9.2 05/08/2023  GFR 72.24 05/08/2023   GFRNONAA >60 03/08/2012   Lab Results  Component Value Date   ALT 20 05/08/2023   AST 19 05/08/2023   ALKPHOS 48 05/08/2023   BILITOT 1.0 05/08/2023    Lab Results  Component Value Date   WBC 8.2 05/08/2023   HGB 16.6 05/08/2023   HCT 49.2 05/08/2023   MCV 97.4 05/08/2023   PLT 165.0 05/08/2023    Hypothyroidism  Pt has no clinical changes No change in energy level/ hair or skin/ edema and no tremor Lab Results  Component Value Date   TSH 5.26 (H) 01/04/2019    Levothyroxine 25 mcg  Seen and treated from the [place]  ? Last TSH  ? Possible in sept   History of tongue cancer  Doing well    Cholesterol Lab Results  Component Value Date   CHOL 172 01/04/2019   HDL 39.40 01/04/2019   LDLCALC 113 (H) 01/04/2019   TRIG 97.0 01/04/2019   CHOLHDL 4 01/04/2019   Needs screening      Patient Active Problem List   Diagnosis Date Noted   Hearing loss 06/21/2023   Elevated antinuclear antibody (ANA) level 05/24/2023   Joint pain 05/22/2023   Gout 05/08/2023   Heel pain 05/08/2023    Routine general medical examination at a health care facility 01/10/2019   Left hand pain 10/01/2018   Left elbow pain 10/01/2018   Prostate cancer screening 09/12/2017   Family history of abdominal aortic aneurysm (AAA) 09/12/2017   Hypothyroidism 09/12/2017   OSA on CPAP 08/14/2014   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   Hypertension    Testosterone  deficiency    Past Medical History:  Diagnosis Date   Actinic keratosis    Hypertension    Low testosterone     Sleep apnea    Past Surgical History:  Procedure Laterality Date   COLONOSCOPY WITH PROPOFOL  N/A 01/19/2015   Procedure: COLONOSCOPY WITH PROPOFOL ;  Surgeon: Lamar ONEIDA Holmes, MD;  Location: [facility] ENDOSCOPY;  Service: Endoscopy;  Laterality: N/A;   HERNIA REPAIR Right 08/2012   MASTOIDECTOMY     tongue excision     04/2012   VASECTOMY     Social History   Tobacco Use   Smoking status: Former   Smokeless tobacco: Never  Vaping Use   Vaping status: Never Used  Substance Use Topics   Alcohol use: Yes    Comment: occassional   Drug use: No   Family History  Problem Relation Age of Onset   Macular degeneration Mother    Cancer Father    AAA (abdominal aortic aneurysm) Father    AAA (abdominal aortic aneurysm) Sister    Allergies  Allergen Reactions   Clindamycin/Lincomycin Rash   Latex    Current Outpatient Medications on File Prior to Visit  Medication Sig Dispense Refill   ANDROGEL  PUMP 20.25 MG/ACT (1.62%) GEL APPLY 4 PUMPS ONCE DAILY AS DIRECTED. 450 g 0   aspirin 81 MG tablet Take 81 mg by mouth daily.     Cholecalciferol (VITAMIN D3) 2000 units TABS Take 1 capsule by mouth.     doxazosin (CARDURA) 8 MG tablet Take 8 mg by mouth daily.     fluticasone  (FLONASE ) 50 MCG/ACT nasal spray instill 1 spray into each nostril once daily 16 g 1   levothyroxine (SYNTHROID) 25 MCG tablet Take 25 mcg by mouth daily before breakfast.     Multiple Vitamin (MULTIVITAMIN) tablet Take 1 tablet by mouth daily.      [  DISCONTINUED] lisinopril-hydrochlorothiazide  (PRINZIDE,ZESTORETIC) 10-12.5 MG per tablet Take 1 tablet by mouth daily.     No current facility-administered medications on file prior to visit.    Review of Systems  Constitutional:  Negative for activity change, appetite change, fatigue, fever and unexpected weight change.  HENT:  Positive for hearing loss. Negative for congestion, rhinorrhea, sore throat and trouble swallowing.   Eyes:  Negative for pain, redness, itching and visual disturbance.  Respiratory:  Negative for cough, chest tightness, shortness of breath and wheezing.   Cardiovascular:  Negative for chest pain and palpitations.  Gastrointestinal:  Negative for abdominal pain, blood in stool, constipation, diarrhea and nausea.  Endocrine: Negative for cold intolerance, heat intolerance, polydipsia and polyuria.  Genitourinary:  Negative for difficulty urinating, dysuria, frequency and urgency.  Musculoskeletal:  Negative for arthralgias, joint swelling and myalgias.       Some heel pain   Skin:  Negative for pallor and rash.  Neurological:  Negative for dizziness, tremors, weakness, numbness and headaches.  Hematological:  Negative for adenopathy. Does not bruise/bleed easily.  Psychiatric/Behavioral:  Negative for decreased concentration and dysphoric mood. The patient is not nervous/anxious.        Objective:   Physical Exam Constitutional:      General: He is not in acute distress.    Appearance: Normal appearance. He is well-developed and normal weight. He is not ill-appearing or diaphoretic.  HENT:     Head: Normocephalic and atraumatic.     Right Ear: Tympanic membrane, ear canal and external ear normal.     Left Ear: Tympanic membrane, ear canal and external ear normal.     Nose: Nose normal. No congestion.     Mouth/Throat:     Mouth: Mucous membranes are moist.     Pharynx: Oropharynx is clear. No posterior oropharyngeal erythema.  Eyes:     General: No  scleral icterus.       Right eye: No discharge.        Left eye: No discharge.     Conjunctiva/sclera: Conjunctivae normal.     Pupils: Pupils are equal, round, and reactive to light.  Neck:     Thyroid : No thyromegaly.     Vascular: No carotid bruit or JVD.  Cardiovascular:     Rate and Rhythm: Normal rate and regular rhythm.     Pulses: Normal pulses.     Heart sounds: Normal heart sounds.     No gallop.  Pulmonary:     Effort: Pulmonary effort is normal. No respiratory distress.     Breath sounds: Normal breath sounds. No wheezing or rales.     Comments: Good air exch Chest:     Chest wall: No tenderness.  Abdominal:     General: Bowel sounds are normal. There is no distension or abdominal bruit.     Palpations: Abdomen is soft. There is no mass.     Tenderness: There is no abdominal tenderness.     Hernia: No hernia is present.  Musculoskeletal:        General: No tenderness.     Cervical back: Normal range of motion and neck supple. No rigidity. No muscular tenderness.     Right lower leg: No edema.     Left lower leg: No edema.     Comments: No acute joint changes   Lymphadenopathy:     Cervical: No cervical adenopathy.  Skin:    General: Skin is warm and dry.     Coloration: Skin is  not pale.     Findings: No erythema or rash.     Comments: Solar lentigines diffusely Scattered sks   Neurological:     Mental Status: He is alert.     Cranial Nerves: No cranial nerve deficit.     Motor: No abnormal muscle tone.     Coordination: Coordination normal.     Gait: Gait normal.     Deep Tendon Reflexes: Reflexes are normal and symmetric. Reflexes normal.  Psychiatric:        Mood and Affect: Mood normal.        Cognition and Memory: Cognition and memory normal.           Assessment & Plan:   Problem List Items Addressed This Visit       Cardiovascular and Mediastinum   Hypertension   bp in fair control at this time without any medication  BP Readings from  Last 1 Encounters:  06/21/23 116/64   No changes needed right now  Takes doxazosin 8 mg daily from [place] for urology /voiding  Most recent labs reviewed  Disc lifstyle change with low sodium diet and exercise          Respiratory   [facility] CPAP   [facility]        Endocrine   Hypothyroidism   Sent for TSH from [place] No clinical changes but notes some fatigue on depression screen  Taking 25 mcg levothyroxine daily          Nervous and Auditory   Hearing loss   Getting hearing aides from the [place]        Other   Testosterone  deficiency   Continues androgel  from [place] Psa and cbc are monitored there       Routine general medical examination at a health care facility - Primary   Reviewed health habits including diet and exercise and skin cancer prevention Reviewed appropriate screening tests for age  Also reviewed health mt list, fam hx and immunization status , as well as social and family history   See HPI Labs reviewed and ordered Will send for last labs from [place] Will send for last colonoscopy report (Dr Dessa) was 07/20/2020  Discussed fall prevention, supplements and exercise for bone density  Has annual dermatology care and screening  PHQ 1  Health Maintenance  Topic Date Due   COVID-19 Vaccine (4 - 2024-25 season) 09/11/2023*   Medicare Annual Wellness Visit  06/14/2024   DTaP/Tdap/Td vaccine (3 - Td or Tdap) 03/17/2033   Pneumonia Vaccine  Completed   Flu Shot  Completed   Hepatitis C Screening  Completed   Zoster (Shingles) Vaccine  Completed   HPV Vaccine  Aged Out   Colon Cancer Screening  Discontinued  *Topic was postponed. The date shown is not the original due date.         Prostate cancer screening   Lab Results  Component Value Date   PSA 1.13 01/04/2019   PSA 1.11 09/12/2017   PSA 0.69 10/06/2015    Pt has psa at [facility] last feb or sept? We will send for that No voiding changes/ takes doxazosin No family history       History of  tongue cancer   Doing well  Utd with specialty follow up        Elevated antinuclear antibody (ANA) level   Pt has appointment to est with rheum in march

## 2023-06-21 NOTE — Assessment & Plan Note (Signed)
 Pt has appointment to est with rheum in march

## 2023-06-21 NOTE — Patient Instructions (Addendum)
 Stay active  Add some strength training to your routine, this is important for bone and brain health and can reduce your risk of falls and help your body use insulin properly and regulate weight  Light weights, exercise bands , and internet videos are a good way to start  Yoga (chair or regular), machines , floor exercises or a gym with machines are also good options     I will work on getting your psa, TSH and cholesterol from the [place]  I will also try and get your colonoscopy report from 07/2020   Take care of yourself !

## 2023-06-21 NOTE — Assessment & Plan Note (Signed)
 Continues androgel from [place] Psa and cbc are monitored there

## 2023-07-31 NOTE — Progress Notes (Signed)
 Office Visit Note  Patient: Austin Knapp             Date of Birth: 01-27-1946           MRN: [id number]             PCP: Judy Pimple, MD Referring: Tower, Audrie Gallus, MD Visit Date: 08/14/2023 Occupation: @GUAROCC @  Subjective:  Pain in joints  History of Present Illness: Austin Knapp is a 78 y.o. male seen in consultation for the evaluation of possible gout.  According to the patient around Thanksgiving he developed severe pain and discomfort in his right great toe.  He states it was red and tender and he could not sleep at night.  He was on crutches for about 3 days.  He was also taking meloxicam.  He was evaluated by Dr. Milinda Antis and was given a different anti-inflammatory.  He states the symptoms lasted for about 1-1/2-week and then resolved.  He did fine after that until yesterday when he started having some discomfort in his right great toe again.  He states he has no difficulty walking at this time.  He has not had gout flares before.  None of the other joints are painful.  He has intermittent stiffness in his neck and his hands.  He consumes red meat about twice a month and strength about 3 times a month.  He also drinks beer once or twice a month.  He also occasionally drinks hard liquor and wine once or twice a month.  He is retired now.  He previously worked as a Emergency planning/management officer, Energy manager, Secondary school teacher for [employer], occupational.  He enjoys walking and stays active.  There is no family history of gout.  He has 1 alive sibling and 3 children.    Activities of Daily Living:  Patient reports morning stiffness for 15 minutes.   Patient Denies nocturnal pain.  Difficulty dressing/grooming: Denies Difficulty climbing stairs: Denies Difficulty getting out of chair: Denies Difficulty using hands for taps, buttons, cutlery, and/or writing: Denies  Review of Systems  Constitutional:  Negative for fatigue.  HENT:  Positive for mouth dryness. Negative for mouth sores and nose  dryness.   Eyes:  Negative for pain and dryness.  Respiratory:  Negative for shortness of breath and difficulty breathing.   Cardiovascular:  Negative for chest pain and palpitations.  Gastrointestinal:  Negative for blood in stool, constipation and diarrhea.  Endocrine: Negative for increased urination.  Genitourinary:  Negative for involuntary urination.  Musculoskeletal:  Positive for joint pain, joint pain, joint swelling and morning stiffness. Negative for gait problem, myalgias, muscle weakness, muscle tenderness and myalgias.  Skin:  Negative for color change, rash, hair loss, redness and sensitivity to sunlight.  Allergic/Immunologic: Negative for susceptible to infections.  Neurological:  Negative for dizziness and headaches.  Hematological:  Negative for swollen glands.  Psychiatric/Behavioral:  Negative for depressed mood and sleep disturbance. The patient is not nervous/anxious.     PMFS History:  Patient Active Problem List   Diagnosis Date Noted   Hearing loss 06/21/2023   Elevated antinuclear antibody (ANA) level 05/24/2023   Joint pain 05/22/2023   Gout 05/08/2023   Heel pain 05/08/2023   Routine general medical examination at a health care facility 01/10/2019   Left hand pain 10/01/2018   Left elbow pain 10/01/2018   Prostate cancer screening 09/12/2017   Family history of abdominal aortic aneurysm (AAA) 09/12/2017   Hypothyroidism 09/12/2017   OSA on CPAP  08/14/2014   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   Hypertension    Testosterone deficiency     Past Medical History:  Diagnosis Date   Actinic keratosis    Hypertension    Low testosterone    Sleep apnea     Family History  Problem Relation Age of Onset   Macular degeneration Mother    Glaucoma Mother    Cancer Father    AAA (abdominal aortic aneurysm) Father    Cancer Sister    Kidney cancer Brother    Healthy Son    Healthy Daughter    Healthy Daughter    Past Surgical History:  Procedure  Laterality Date   COLONOSCOPY WITH PROPOFOL N/A 01/19/2015   Procedure: COLONOSCOPY WITH PROPOFOL;  Surgeon: Scot Jun, MD;  Location: [facility] ENDOSCOPY;  Service: Endoscopy;  Laterality: N/A;   HERNIA REPAIR Right 08/2012   MASTOIDECTOMY     tongue excision     04/2012   VASECTOMY     Social History   Social History Narrative   Patients desires CPR.   Has HPOA.   Immunization History  Administered Date(s) Administered   Influenza, High Dose Seasonal PF 02/12/2016, 03/03/2017, 02/21/2022, 04/19/2023   Influenza-Unspecified 03/13/2014, 02/11/2018   Pneumococcal Conjugate-13 12/17/2013   Pneumococcal Polysaccharide-23 03/25/2010, 10/09/2015   Tdap 10/09/2015, 03/18/2023   Zoster Recombinant(Shingrix) 11/08/2016, 02/06/2017   Zoster, Live 01/27/2010     Objective: Vital Signs: BP (!) 150/96 (BP Location: Right Arm, Patient Position: Sitting, Cuff Size: Normal)   Pulse (!) 55   Resp 16   Ht 5' 8.5" (1.74 m)   Wt 198 lb 12.8 oz (90.2 kg)   BMI 29.79 kg/m    Physical Exam Vitals and nursing note reviewed.  Constitutional:      Appearance: He is well-developed.  HENT:     Head: Normocephalic and atraumatic.  Eyes:     Conjunctiva/sclera: Conjunctivae normal.     Pupils: Pupils are equal, round, and reactive to light.  Cardiovascular:     Rate and Rhythm: Normal rate and regular rhythm.     Heart sounds: Normal heart sounds.  Pulmonary:     Effort: Pulmonary effort is normal.     Breath sounds: Normal breath sounds.  Abdominal:     General: Bowel sounds are normal.     Palpations: Abdomen is soft.  Musculoskeletal:     Cervical back: Normal range of motion and neck supple.  Skin:    General: Skin is warm and dry.     Capillary Refill: Capillary refill takes less than 2 seconds.  Neurological:     Mental Status: He is alert and oriented to person, place, and time.  Psychiatric:        Behavior: Behavior normal.      Musculoskeletal Exam: He had good range of  motion of the cervical spine with some stiffness.  He was unable to reach his toes on forward flexion due to tight hamstrings.  He denied any discomfort in his lumbar spine or SI joints.  Shoulder joints, elbow joints, wrist joints, MCPs PIPs and DIPs Juengel range of motion.  He had mild thickening of the DIP joints.  Hip joints and knee joints in good range of motion without any warmth swelling or effusion.  There was no tenderness over ankles.  He had mild tenderness over the right first MTP joint.  No warmth or swelling was noted.  There was no plantar fasciitis or Achilles tendinitis.  CDAI Exam: CDAI  Score: -- Patient Global: --; Provider Global: -- Swollen: --; Tender: -- Joint Exam 08/14/2023   No joint exam has been documented for this visit   There is currently no information documented on the homunculus. Go to the Rheumatology activity and complete the homunculus joint exam.  Investigation: No additional findings.  Imaging: XR Hand 2 View Right Result Date: 08/14/2023 CMC, PIP and DIP narrowing was noted.  No MCP, intercarpal or radiocarpal joint space narrowing was noted.  No erosive changes were noted. 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  XR Hand 2 View Left Result Date: 08/14/2023 CMC narrowing and subluxation was noted.  PIP and DIP narrowing was noted.  No erosive changes were noted.  No MCP, intercarpal or radiocarpal joint space narrowing was noted. 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  XR Foot 2 Views Left Result Date: 08/14/2023 First MTP, PIP and DIP narrowing was noted.  No intertarsal, tibiotalar or subtalar joint space narrowing was noted.  Posterior calcaneal spur was noted.  No erosive changes were noted. 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   Recent Labs: Lab Results  Component Value Date   WBC 8.2 05/08/2023   HGB 16.6 05/08/2023   PLT 165.0 05/08/2023    NA 138 05/08/2023   K 4.4 05/08/2023   CL 102 05/08/2023   CO2 27 05/08/2023   GLUCOSE 89 05/08/2023   BUN 20 05/08/2023   CREATININE 1.01 05/08/2023   BILITOT 1.0 05/08/2023   ALKPHOS 48 05/08/2023   AST 19 05/08/2023   ALT 20 05/08/2023   PROT 6.5 05/08/2023   ALBUMIN 4.3 05/08/2023   CALCIUM 9.2 05/08/2023   GFRAA >60 03/08/2012   May 22, 2023 ANA 1: 40 NS, RF negative, sed rate 3, uric acid 7.2 May 08, 2023 uric acid 6.3  Speciality Comments: No specialty comments available.  Procedures:  No procedures performed Allergies: Clindamycin/lincomycin and Latex   Assessment / Plan:     Visit Diagnoses: Idiopathic chronic gout of multiple sites without tophus -the clinical features are suggestive of gouty arthropathy with of an episode severe enough to last about 1-1/2-week and responded to NSAIDs.  He felt a slight twinge yesterday and is still has mild tenderness over the right first MTP joint.  No warmth or swelling was noted.  His uric acid was not elevated in the past which could be low at the time of a flare.  Detail counseled on gouty arthropathy was provided.  A handout was given.  Dietary modifications were discussed.  He consumes red meat and drinks beer and hard liquor.  I discussed possible use of colchicine on as needed basis as he had only 1 episode and another possible episode.  Side effects of colchicine were discussed at length.  I plan to give him colchicine 0.6 mg p.o. daily and twice daily as needed flares.  Plan: Uric acid  Elevated antinuclear antibody (ANA) level-ANA is low titer and not significant.  He denies any history of oral ulcers, nasal ulcers, malar rash, photosensitivity, Raynaud's, lymphadenopathy, hair loss or inflammatory arthritis.  Pain in both feet -he has discomfort in his feet.  Some osteoarthritic changes were noted.  No synovitis was noted.  Mild tenderness over right first MTP was noted.  He had x-rays of his right foot on May 08, 2023 which I reviewed with him.  He had osteoarthritic changes and calcaneal spur.  There was a possible cystic or  erosive change in the fifth metatarsal which could be seen in gouty arthropathy or rheumatoid arthritis.  I will obtain x-rays of the other foot today.  I will also obtain additional antibodies.  Plan: XR Foot 2 Views Left, x-rays were suggestive of osteoarthritis.  Cyclic citrul peptide antibody, IgG, Mutated Citrullinated Vimentin (MCV) Antibody, Sedimentation rate  Pain in both hands -he has a stiffness in his hands.  No synovitis was noted.  Mild prominence of DIP joints was noted.  Plan: XR Hand 2 View Right, XR Hand 2 View Left.  X-rays of bilateral hands were suggestive of osteoarthritis.  Medication monitoring encounter -he was taking NSAIDs.  I will obtain some baseline labs today.  Plan: CBC with Differential/Platelet, COMPLETE METABOLIC PANEL WITH GFR  Primary hypertension-blood pressure is elevated at 150/96.  He was advised to monitor blood pressure closely and follow-up with his PCP.  History of tongue cancer - 2013, CTX, RTX, laser surgery-follow up x 10 yeras.He goes to ENT now  Acquired hypothyroidism  Other specified hearing loss of both ears - He gets hearing aids from [place].  Testosterone deficiency - On And[facility]  OSA on CPAP  Family history of abdominal aortic aneurysm (AAA)  Orders: Orders Placed This Encounter  Procedures   XR Hand 2 View Right   XR Hand 2 View Left   XR Foot 2 Views Left   CBC with Differential/Platelet   COMPLETE METABOLIC PANEL WITH GFR   Uric acid   Cyclic citrul peptide antibody, IgG   Mutated Citrullinated Vimentin (MCV) Antibody   Sedimentation rate   Meds ordered this encounter  Medications   colchicine 0.6 MG tablet    Sig: Take 1 tablet (0.6 mg total) by mouth 2 (two) times daily as needed.    Dispense:  120 tablet    Refill:  0     Follow-Up Instructions: Return for Pain in multiple  joints.   Pollyann Savoy, MD  Note - This record has been created using Animal nutritionist.  Chart creation errors have been sought, but may not always  have been located. Such creation errors do not reflect on  the standard of medical care.

## 2023-08-14 ENCOUNTER — Ambulatory Visit

## 2023-08-14 ENCOUNTER — Ambulatory Visit: Payer: Medicare PPO | Attending: Rheumatology | Admitting: Rheumatology

## 2023-08-14 ENCOUNTER — Encounter: Payer: Self-pay | Admitting: Rheumatology

## 2023-08-14 VITALS — BP 150/96 | HR 55 | Resp 16 | Ht 68.5 in | Wt 198.8 lb

## 2023-08-14 DIAGNOSIS — Z85810 Personal history of malignant neoplasm of tongue: Secondary | ICD-10-CM | POA: Diagnosis not present

## 2023-08-14 DIAGNOSIS — I10 Essential (primary) hypertension: Secondary | ICD-10-CM

## 2023-08-14 DIAGNOSIS — M1A09X0 Idiopathic chronic gout, multiple sites, without tophus (tophi): Secondary | ICD-10-CM | POA: Diagnosis not present

## 2023-08-14 DIAGNOSIS — E039 Hypothyroidism, unspecified: Secondary | ICD-10-CM | POA: Diagnosis not present

## 2023-08-14 DIAGNOSIS — G4733 Obstructive sleep apnea (adult) (pediatric): Secondary | ICD-10-CM

## 2023-08-14 DIAGNOSIS — H918X3 Other specified hearing loss, bilateral: Secondary | ICD-10-CM

## 2023-08-14 DIAGNOSIS — M79671 Pain in right foot: Secondary | ICD-10-CM | POA: Diagnosis not present

## 2023-08-14 DIAGNOSIS — Z5181 Encounter for therapeutic drug level monitoring: Secondary | ICD-10-CM | POA: Diagnosis not present

## 2023-08-14 DIAGNOSIS — R768 Other specified abnormal immunological findings in serum: Secondary | ICD-10-CM

## 2023-08-14 DIAGNOSIS — M79672 Pain in left foot: Secondary | ICD-10-CM

## 2023-08-14 DIAGNOSIS — M79641 Pain in right hand: Secondary | ICD-10-CM

## 2023-08-14 DIAGNOSIS — M79642 Pain in left hand: Secondary | ICD-10-CM | POA: Diagnosis not present

## 2023-08-14 DIAGNOSIS — Z8249 Family history of ischemic heart disease and other diseases of the circulatory system: Secondary | ICD-10-CM

## 2023-08-14 DIAGNOSIS — E349 Endocrine disorder, unspecified: Secondary | ICD-10-CM

## 2023-08-14 MED ORDER — COLCHICINE 0.6 MG PO TABS
0.6000 mg | ORAL_TABLET | Freq: Two times a day (BID) | ORAL | 0 refills | Status: DC | PRN
Start: 2023-08-14 — End: 2023-10-25

## 2023-08-14 NOTE — Patient Instructions (Signed)

## 2023-08-14 NOTE — Progress Notes (Signed)
 Counseled patient on the purpose proper use, and adverse effects of colchicine.  Reviewed importance of avoiding alcohol. Discussed possible side effects of nausea, diarrhea which are dose-related. Discussed possible for muscle weakness after long period of use.  Discussed importance of low purine diet - avoiding alcohol, shellfish, red meats, etc.  Provided patient with medication education material and answered all questions.    Reviewed drug-drug interaction with colchicine and current medications - there are no interactions with his current medication list  Patient's dose will be:  colchicine 0.6mg  every day or twice daily prn  Colchicine 1.2mg  at first sign of flare then 0.6mg  one hour later, then 0.6mg  twice dialy until flares resolve   Rx sent to [facility] today  Chesley Mires, PharmD, MPH, BCPS, CPP Clinical Pharmacist (Rheumatology and Pulmonology)

## 2023-08-18 LAB — CBC WITH DIFFERENTIAL/PLATELET
Absolute Lymphocytes: 1221 {cells}/uL (ref 850–3900)
Absolute Monocytes: 504 {cells}/uL (ref 200–950)
Basophils Absolute: 62 {cells}/uL (ref 0–200)
Basophils Relative: 1.1 %
Eosinophils Absolute: 174 {cells}/uL (ref 15–500)
Eosinophils Relative: 3.1 %
HCT: 51.7 % — ABNORMAL HIGH (ref 38.5–50.0)
Hemoglobin: 16.9 g/dL (ref 13.2–17.1)
MCH: 31.2 pg (ref 27.0–33.0)
MCHC: 32.7 g/dL (ref 32.0–36.0)
MCV: 95.4 fL (ref 80.0–100.0)
MPV: 10 fL (ref 7.5–12.5)
Monocytes Relative: 9 %
Neutro Abs: 3640 {cells}/uL (ref 1500–7800)
Neutrophils Relative %: 65 %
Platelets: 148 10*3/uL (ref 140–400)
RBC: 5.42 10*6/uL (ref 4.20–5.80)
RDW: 12.5 % (ref 11.0–15.0)
Total Lymphocyte: 21.8 %
WBC: 5.6 10*3/uL (ref 3.8–10.8)

## 2023-08-18 LAB — COMPLETE METABOLIC PANEL WITH GFR
AG Ratio: 2.4 (calc) (ref 1.0–2.5)
ALT: 19 U/L (ref 9–46)
AST: 16 U/L (ref 10–35)
Albumin: 4.6 g/dL (ref 3.6–5.1)
Alkaline phosphatase (APISO): 48 U/L (ref 35–144)
BUN/Creatinine Ratio: 29 (calc) — ABNORMAL HIGH (ref 6–22)
BUN: 26 mg/dL — ABNORMAL HIGH (ref 7–25)
CO2: 31 mmol/L (ref 20–32)
Calcium: 9.4 mg/dL (ref 8.6–10.3)
Chloride: 103 mmol/L (ref 98–110)
Creat: 0.9 mg/dL (ref 0.70–1.28)
Globulin: 1.9 g/dL (ref 1.9–3.7)
Glucose, Bld: 88 mg/dL (ref 65–99)
Potassium: 4.4 mmol/L (ref 3.5–5.3)
Sodium: 140 mmol/L (ref 135–146)
Total Bilirubin: 0.7 mg/dL (ref 0.2–1.2)
Total Protein: 6.5 g/dL (ref 6.1–8.1)
eGFR: 89 mL/min/{1.73_m2} (ref >=60)

## 2023-08-18 LAB — MUTATED CITRULLINATED VIMENTIN (MCV) ANTIBODY: MUTATED CITRULLINATED VIMENTIN (MCV) AB: <20 U/mL (ref <20)

## 2023-08-18 LAB — SEDIMENTATION RATE: Sed Rate: 2 mm/h (ref 0–20)

## 2023-08-18 LAB — URIC ACID: Uric Acid, Serum: 6.8 mg/dL (ref 4.0–8.0)

## 2023-08-18 LAB — CYCLIC CITRUL PEPTIDE ANTIBODY, IGG: Cyclic Citrullin Peptide Ab: <16 U

## 2023-08-18 NOTE — Progress Notes (Signed)
 CBC and CMP normal.  Tests for rheumatoid arthritis (Anti-CCP negative, MCV negative), sed rate 2.

## 2023-08-24 ENCOUNTER — Encounter: Payer: Self-pay | Admitting: Dermatology

## 2023-08-24 ENCOUNTER — Ambulatory Visit (INDEPENDENT_AMBULATORY_CARE_PROVIDER_SITE_OTHER): Payer: Medicare PPO | Admitting: Dermatology

## 2023-08-24 DIAGNOSIS — W908XXA Exposure to other nonionizing radiation, initial encounter: Secondary | ICD-10-CM | POA: Diagnosis not present

## 2023-08-24 DIAGNOSIS — L820 Inflamed seborrheic keratosis: Secondary | ICD-10-CM | POA: Diagnosis not present

## 2023-08-24 DIAGNOSIS — L821 Other seborrheic keratosis: Secondary | ICD-10-CM | POA: Diagnosis not present

## 2023-08-24 DIAGNOSIS — Z7189 Other specified counseling: Secondary | ICD-10-CM

## 2023-08-24 DIAGNOSIS — L578 Other skin changes due to chronic exposure to nonionizing radiation: Secondary | ICD-10-CM

## 2023-08-24 DIAGNOSIS — Z79899 Other long term (current) drug therapy: Secondary | ICD-10-CM | POA: Diagnosis not present

## 2023-08-24 DIAGNOSIS — L814 Other melanin hyperpigmentation: Secondary | ICD-10-CM | POA: Diagnosis not present

## 2023-08-24 MED ORDER — TRETINOIN 0.025 % EX CREA
TOPICAL_CREAM | CUTANEOUS | 11 refills | Status: AC
Start: 2023-08-24 — End: ?

## 2023-08-24 NOTE — Progress Notes (Signed)
 Follow-Up Visit   Subjective  Austin Knapp is a 78 y.o. male who presents for the following: bbl today And has some itchy spots at right flank area he would like checked . The patient has spots, moles and lesions to be evaluated, some may be new or changing and the patient may have concern these could be cancer.  The following portions of the chart were reviewed this encounter and updated as appropriate: medications, allergies, medical history  Review of Systems:  No other skin or systemic complaints except as noted in HPI or Assessment and Plan.  Objective  Well appearing patient in no apparent distress; mood and affect are within normal limits.  A focused examination was performed of the following areas: Face  Relevant exam findings are noted in the Assessment and Plan.                  left flank x 2 (2) Erythematous stuck-on, waxy papule or plaque  Assessment & Plan   SEBORRHEIC KERATOSIS - Stuck-on, waxy, tan-brown papules and/or plaques  - Benign-appearing - Discussed benign etiology and prognosis. - Observe - Call for any changes  LENTIGINES / ACTINIC DAMAGE  Exam: scattered tan macules Due to sun exposure - chronic, secondary to cumulative UV radiation exposure/sun exposure over time - diffuse scaly erythematous macules with underlying dyspigmentation - Recommend daily broad spectrum sunscreen SPF 30+ to sun-exposed areas, reapply every 2 hours as needed.  - Recommend staying in the shade or wearing long sleeves, sun glasses (UVA+UVB protection) and wide brim hats (4-inch brim around the entire circumference of the hat). - Call for new or changing lesions.  Treatment Plan: BBL today for actinic damage and lentigines   Start tretinoin 0.025 % cream - apply pea sized amount to face and hands as tolerated qhs.  Patient given Goodrx coupon to use and rx sent to [facility]  Topical retinoid medications like tretinoin/Retin-A,  adapalene/Differin, tazarotene/Fabior, and Epiduo/Epiduo Forte can cause dryness and irritation when first started. Only apply a pea-sized amount to the entire affected area. Avoid applying it around the eyes, edges of mouth and creases at the nose. If you experience irritation, use a good moisturizer first and/or apply the medicine less often. If you are doing well with the medicine, you can increase how often you use it until you are applying every night. Be careful with sun protection while using this medication as it can make you sensitive to the sun. This medicine should not be used by pregnant women.   Patient given BBL starter Kit along with Cooling Gel Mask at appointment today   Laser safety: Patient was advised in laser safety.  Patient was fitted with laser safety goggles and advised to keep eyes closed during procedure with goggles on. Staff and provider ensured that patient and their own safety goggles were also on and eyes protected during procedure. Laser room door was secured and locked from the inside. Laser room door has laser safety sign affixed to the outside of the door.     [place] BBL - 08/24/23 1400      Patient Details   Skin Type: I    Anesthestic Cream Applied: No    Photo Takes: Yes    Consent Signed: Yes      Treatment Details   Date: 08/24/23    Treatment #: 1    Area: face    Filter: 1st Pass;2nd Pass      1st Pass   Location:  F    Device: 515   pigmented lesions cheeks, nose, chin, temples, forehead   BBL j/cm2: 12    PW Msec Sec: 10    Cooling Temp: 25    Pulses: 217    11mm: this one      2nd Pass   Location: F    Device: 515 filter   pigmented lesions on nose, cheeks, temples   BBL j/cm2: 15    PW Msec Sec: 15    Cooling Temp: 15    Pulses: 52    7mm: this one   Patient tolerated the procedure well.   [name] avoidance was stressed. The patient will call with any problems, questions or concerns prior to their next appointment. ACTINIC SKIN  DAMAGE   Related Medications tretinoin (RETIN-A) 0.025 % cream Apply pea sized amount to face nightly as tolerated INFLAMED SEBORRHEIC KERATOSIS (2) left flank x 2 (2) Symptomatic, irritating, patient would like treated. Destruction of lesion - left flank x 2 (2) Complexity: simple   Destruction method: cryotherapy   Informed consent: discussed and consent obtained   Timeout:  patient name, date of birth, surgical site, and procedure verified Lesion destroyed using liquid nitrogen: Yes   Region frozen until ice ball extended beyond lesion: Yes   Outcome: patient tolerated procedure well with no complications   Post-procedure details: wound care instructions given   LENTIGO   SEBORRHEIC KERATOSIS    Return for 8 week bbl if available .  IAsher Knapp, CMA, am acting as scribe for Armida Sans, MD.   Documentation: I have reviewed the above documentation for accuracy and completeness, and I agree with the above.  Armida Sans, MD

## 2023-08-24 NOTE — Patient Instructions (Addendum)
 Start tretinoin 0.025 % cream - apply pea sized amount to face and hands as tolerated nightly  Topical retinoid medications like tretinoin/Retin-A, adapalene/Differin, tazarotene/Fabior, and Epiduo/Epiduo Forte can cause dryness and irritation when first started. Only apply a pea-sized amount to the entire affected area. Avoid applying it around the eyes, edges of mouth and creases at the nose. If you experience irritation, use a good moisturizer first and/or apply the medicine less often. If you are doing well with the medicine, you can increase how often you use it until you are applying every night. Be careful with sun protection while using this medication as it can make you sensitive to the sun. This medicine should not be used by pregnant women.      Seborrheic Keratosis  What causes seborrheic keratoses? Seborrheic keratoses are harmless, common skin growths that first appear during adult life.  As time goes by, more growths appear.  Some people may develop a large number of them.  Seborrheic keratoses appear on both covered and uncovered body parts.  They are not caused by sunlight.  The tendency to develop seborrheic keratoses can be inherited.  They vary in color from skin-colored to gray, brown, or even black.  They can be either smooth or have a rough, warty surface.   Seborrheic keratoses are superficial and look as if they were stuck on the skin.  Under the microscope this type of keratosis looks like layers upon layers of skin.  That is why at times the top layer may seem to fall off, but the rest of the growth remains and re-grows.    Treatment Seborrheic keratoses do not need to be treated, but can easily be removed in the office.  Seborrheic keratoses often cause symptoms when they rub on clothing or jewelry.  Lesions can be in the way of shaving.  If they become inflamed, they can cause itching, soreness, or burning.  Removal of a seborrheic keratosis can be accomplished by freezing,  burning, or surgery. If any spot bleeds, scabs, or grows rapidly, please return to have it checked, as these can be an indication of a skin cancer.   Cryotherapy Aftercare  Wash gently with soap and water everyday.   Apply Vaseline and Band-Aid daily until healed.    Due to recent changes in healthcare laws, you may see results of your pathology and/or laboratory studies on MyChart before the doctors have had a chance to review them. We understand that in some cases there may be results that are confusing or concerning to you. Please understand that not all results are received at the same time and often the doctors may need to interpret multiple results in order to provide you with the best plan of care or course of treatment. Therefore, we ask that you please give [place] 2 business days to thoroughly review all your results before contacting the office for clarification. Should we see a critical lab result, you will be contacted sooner.   If You Need Anything After Your Visit  If you have any questions or concerns for your doctor, please call our main line at [phone] and press option 4 to reach your doctor's medical assistant. If no one answers, please leave a voicemail as directed and we will return your call as soon as possible. Messages left after 4 pm will be answered the following business day.   You may also send [place] a message via MyChart. We typically respond to MyChart messages within 1-2 business days.  For prescription refills, please ask your pharmacy to contact our office. Our fax number is [phone].  If you have an urgent issue when the clinic is closed that cannot wait until the next business day, you can page your doctor at the number below.    Please note that while we do our best to be available for urgent issues outside of office hours, we are not available 24/7.   If you have an urgent issue and are unable to reach [place], you may choose to seek medical care at your  doctor's office, retail clinic, urgent care center, or emergency room.  If you have a medical emergency, please immediately call 911 or go to the emergency department.  Pager Numbers  - Dr. Gwen Pounds: [phone]  - Dr. Roseanne Reno: [phone]  - Dr. Katrinka Blazing: [phone]   In the event of inclement weather, please call our main line at [phone] for an update on the status of any delays or closures.  Dermatology Medication Tips: Please keep the boxes that topical medications come in in order to help keep track of the instructions about where and how to use these. Pharmacies typically print the medication instructions only on the boxes and not directly on the medication tubes.   If your medication is too expensive, please contact our office at [phone] option 4 or send [place] a message through MyChart.   We are unable to tell what your co-pay for medications will be in advance as this is different depending on your insurance coverage. However, we may be able to find a substitute medication at lower cost or fill out paperwork to get insurance to cover a needed medication.   If a prior authorization is required to get your medication covered by your insurance company, please allow [place] 1-2 business days to complete this process.  Drug prices often vary depending on where the prescription is filled and some pharmacies may offer cheaper prices.  The website www.goodrx.com contains coupons for medications through different pharmacies. The prices here do not account for what the cost may be with help from insurance (it may be cheaper with your insurance), but the website can give you the price if you did not use any insurance.  - You can print the associated coupon and take it with your prescription to the pharmacy.  - You may also stop by our office during regular business hours and pick up a GoodRx coupon card.  - If you need your prescription sent electronically to a different pharmacy, notify  our office through [facility] or by phone at [phone] option 4.     Si Usted Necesita Algo Despus de Su Visita  Tambin puede enviarnos un mensaje a travs de Clinical cytogeneticist. Por lo general respondemos a los mensajes de MyChart en el transcurso de 1 a 2 das hbiles.  Para renovar recetas, por favor pida a su farmacia que se ponga en contacto con nuestra oficina. Annie Sable de fax es [place] [phone].  Si tiene un asunto urgente cuando la clnica est cerrada y que no puede esperar hasta el siguiente da hbil, puede llamar/localizar a su doctor(a) al nmero que aparece a continuacin.   Por favor, tenga en cuenta que aunque hacemos todo lo posible para estar disponibles para asuntos urgentes fuera del horario de [place], no estamos disponibles las 24 horas del da, los 7 [address]  [address] de la [place].   Si tiene un problema urgente y no puede comunicarse con nosotros, puede optar por buscar atencin mdica  en el consultorio de su doctor(a), en una clnica privada, en un centro de atencin urgente o en una sala de emergencias.  Si tiene Engineer, drilling, por favor llame inmediatamente al 911 o vaya a la sala de emergencias.  Nmeros de bper  - Dr. Gwen Pounds: [phone]  - Dra. Roseanne Reno: [phone]  - Dr. Katrinka Blazing: [phone]   En caso de inclemencias del tiempo, por favor llame a Lacy Duverney principal al [phone] para una actualizacin sobre el [place] de cualquier retraso o cierre.  Consejos para la medicacin en dermatologa: Por favor, guarde las cajas en las que vienen los medicamentos de uso tpico para ayudarle a seguir las instrucciones sobre dnde y cmo usarlos. Las farmacias generalmente imprimen las instrucciones del medicamento slo en las cajas y no directamente en los tubos del [place].   Si su medicamento es muy caro, por favor, pngase en contacto con Rolm Gala llamando al [phone] y presione la opcin 4 o envenos un mensaje a travs de  Clinical cytogeneticist.   No podemos decirle cul ser su copago por los medicamentos por adelantado ya que esto es diferente dependiendo de la cobertura de su seguro. Sin embargo, es posible que podamos encontrar un medicamento sustituto a Audiological scientist un formulario para que el seguro cubra el medicamento que se considera necesario.   Si se requiere una autorizacin previa para que su compaa de seguros [place] su medicamento, por favor permtanos de 1 a 2 das hbiles para completar [address]  Los precios de los medicamentos varan con frecuencia dependiendo del Environmental consultant de dnde se surte la receta y alguna farmacias pueden ofrecer precios ms baratos.  El sitio web www.goodrx.com tiene cupones para medicamentos de Health and safety inspector. Los precios aqu no tienen en cuenta lo que podra costar con la ayuda del seguro (puede ser ms barato con su seguro), pero el sitio web puede darle el precio si no utiliz Tourist information centre manager.  - Puede imprimir el cupn correspondiente y llevarlo con su receta a la farmacia.  - Tambin puede pasar por nuestra oficina durante el horario de atencin regular y Education officer, museum una tarjeta de cupones de GoodRx.  - Si necesita que su receta se enve electrnicamente a una farmacia diferente, informe a nuestra oficina a travs de MyChart de [place] o por telfono llamando al [phone] y presione la opcin 4.

## 2023-09-05 DIAGNOSIS — H2513 Age-related nuclear cataract, bilateral: Secondary | ICD-10-CM | POA: Diagnosis not present

## 2023-09-05 DIAGNOSIS — H43813 Vitreous degeneration, bilateral: Secondary | ICD-10-CM | POA: Diagnosis not present

## 2023-09-05 DIAGNOSIS — H353131 Nonexudative age-related macular degeneration, bilateral, early dry stage: Secondary | ICD-10-CM | POA: Diagnosis not present

## 2023-09-05 DIAGNOSIS — H04123 Dry eye syndrome of bilateral lacrimal glands: Secondary | ICD-10-CM | POA: Diagnosis not present

## 2023-09-06 NOTE — Progress Notes (Signed)
 Office Visit Note  Patient: Austin Knapp             Date of Birth: 10-Dec-1945           MRN: [id number]             PCP: Judy Pimple, MD Referring: Tower, Audrie Gallus, MD Visit Date: 09/20/2023 Occupation: @GUAROCC @  Subjective:  Pain in joints  History of Present Illness: Austin Knapp is a 78 y.o. male with history of clinical gout.  Patient states that he had only 1 episode of gout with no recurrence.  The symptoms resolved on colchicine.  He continues to have some pain and stiffness in his hands especially her CMC joints.  The left [facility] joint is more painful.  He also had a stiffness in his feet due to high arches.  He has some hammertoes.  He has cut back on the beer intake.  He is also trying dietary modifications.  He has a prescription for colchicine at home.    Activities of Daily Living:  Patient reports morning stiffness for a few minutes.   Patient Denies nocturnal pain.  Difficulty dressing/grooming: Denies Difficulty climbing stairs: Denies Difficulty getting out of chair: Denies Difficulty using hands for taps, buttons, cutlery, and/or writing: Denies  Review of Systems  Constitutional:  Negative for fatigue.  HENT:  Positive for mouth dryness. Negative for mouth sores.   Eyes:  Negative for dryness.  Respiratory:  Negative for shortness of breath.   Cardiovascular:  Negative for chest pain and palpitations.  Gastrointestinal:  Negative for blood in stool, constipation and diarrhea.  Endocrine: Negative for increased urination.  Genitourinary:  Negative for involuntary urination.  Musculoskeletal:  Positive for morning stiffness. Negative for joint pain, gait problem, joint pain, joint swelling, myalgias, muscle weakness, muscle tenderness and myalgias.  Skin:  Negative for color change, rash, hair loss and sensitivity to sunlight.  Allergic/Immunologic: Negative for susceptible to infections.  Neurological:  Negative for dizziness and headaches.  Hematological:   Negative for swollen glands.  Psychiatric/Behavioral:  Negative for depressed mood and sleep disturbance. The patient is not nervous/anxious.     PMFS History:  Patient Active Problem List   Diagnosis Date Noted   Hearing loss 06/21/2023   Elevated antinuclear antibody (ANA) level 05/24/2023   Joint pain 05/22/2023   Gout 05/08/2023   Heel pain 05/08/2023   Routine general medical examination at a health care facility 01/10/2019   Left hand pain 10/01/2018   Left elbow pain 10/01/2018   Prostate cancer screening 09/12/2017   Family history of abdominal aortic aneurysm (AAA) 09/12/2017   Hypothyroidism 09/12/2017   OSA on CPAP 08/14/2014   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   Hypertension    Testosterone deficiency     Past Medical History:  Diagnosis Date   Actinic keratosis    Hypertension    Idiopathic gout    Low testosterone    Sleep apnea    sever - BiPAP. 56 Hypopneas/hr   Wears hearing aid in both ears     Family History  Problem Relation Age of Onset   Macular degeneration Mother    Glaucoma Mother    Cancer Father    AAA (abdominal aortic aneurysm) Father    Cancer Sister    Kidney cancer Brother    Healthy Son    Healthy Daughter    Healthy Daughter    Past Surgical History:  Procedure Laterality Date   COLONOSCOPY WITH PROPOFOL  N/A 01/19/2015   Procedure: COLONOSCOPY WITH PROPOFOL;  Surgeon: Scot Jun, MD;  Location: [facility] ENDOSCOPY;  Service: Endoscopy;  Laterality: N/A;   HERNIA REPAIR Right 08/2012   MASTOIDECTOMY     tongue excision     04/2012   VASECTOMY     Social History   Social History Narrative   Patients desires CPR.   Has HPOA.   Immunization History  Administered Date(s) Administered   Influenza, High Dose Seasonal PF 02/12/2016, 03/03/2017, 02/21/2022, 04/19/2023   Influenza-Unspecified 03/13/2014, 02/11/2018   Pneumococcal Conjugate-13 12/17/2013   Pneumococcal Polysaccharide-23 03/25/2010, 10/09/2015   Tdap  10/09/2015, 03/18/2023   Zoster Recombinant(Shingrix) 11/08/2016, 02/06/2017   Zoster, Live 01/27/2010     Objective: Vital Signs: BP 136/74 (BP Location: Left Arm, Patient Position: Sitting, Cuff Size: Large)   Pulse (!) 51   Resp 15   Ht 5\' 8"  (1.727 m)   Wt 203 lb (92.1 kg)   BMI 30.87 kg/m    Physical Exam Vitals and nursing note reviewed.  Constitutional:      Appearance: He is well-developed.  HENT:     Head: Normocephalic and atraumatic.  Eyes:     Conjunctiva/sclera: Conjunctivae normal.     Pupils: Pupils are equal, round, and reactive to light.  Cardiovascular:     Rate and Rhythm: Normal rate and regular rhythm.     Heart sounds: Normal heart sounds.  Pulmonary:     Effort: Pulmonary effort is normal.     Breath sounds: Normal breath sounds.  Abdominal:     General: Bowel sounds are normal.     Palpations: Abdomen is soft.  Musculoskeletal:     Cervical back: Normal range of motion and neck supple.  Skin:    General: Skin is warm and dry.     Capillary Refill: Capillary refill takes less than 2 seconds.  Neurological:     Mental Status: He is alert and oriented to person, place, and time.  Psychiatric:        Behavior: Behavior normal.      Musculoskeletal Exam: Cervical spine with good range of motion.  Shoulders, elbows, wrist joints, MCPs PIPs and DIPs with good range of motion.  He had tenderness over left CMC joint.  Subluxation of bilateral second MCP joints with no synovitis.  Mild DIP thickening was noted.  Hip joints and knee joints with good range of motion without any warmth swelling or effusion.  There was no tenderness over ankles.  He had bilateral pes cavus and mild hammertoes.  There was no tenderness over MTPs.  CDAI Exam: CDAI Score: -- Patient Global: --; Provider Global: -- Swollen: --; Tender: -- Joint Exam 09/20/2023   No joint exam has been documented for this visit   There is currently no information documented on the  homunculus. Go to the Rheumatology activity and complete the homunculus joint exam.  Investigation: No additional findings.  Imaging: No results found.   Recent Labs: Lab Results  Component Value Date   WBC 5.6 08/14/2023   HGB 16.9 08/14/2023   PLT 148 08/14/2023   NA 140 08/14/2023   K 4.4 08/14/2023   CL 103 08/14/2023   CO2 31 08/14/2023   GLUCOSE 88 08/14/2023   BUN 26 (H) 08/14/2023   CREATININE 0.90 08/14/2023   BILITOT 0.7 08/14/2023   ALKPHOS 48 05/08/2023   AST 16 08/14/2023   ALT 19 08/14/2023   PROT 6.5 08/14/2023   ALBUMIN 4.3 05/08/2023   CALCIUM 9.4  08/14/2023   GFRAA >60 03/08/2012   August 14, 2023 uric acid 6.8, anti-CCP negative, MCV negative, sed rate 2   May 22, 2023 ANA 1: 40 NS, RF negative, sed rate 3, uric acid 7.2 May 08, 2023 uric acid 6.3  Speciality Comments: No specialty comments available.  Procedures:  No procedures performed Allergies: Clindamycin/lincomycin and Latex   Assessment / Plan:     Visit Diagnoses: Idiopathic chronic gout of multiple sites without tophus -patient gives history of typical clinical gout with redness and swelling over his right first MTP joint which responded to colchicine.  He had no recurrence of symptoms.  He was given a prescription for colchicine p.o. twice daily as needed for flares at the last visit.  Uric acid obtained on August 14, 2023 was 6.8.  Anti-CCP and MCV were negative.  Sed rate was normal.  Lab results were discussed with the patient at length.  Dietary modifications were again discussed.  Cutting back on the alcohol intake was also discussed.  He normally drinks 1 or 2 beers per day and hard liquor.  A handout on gout was provided.  I advised him to contact me if he develops another flare.  Elevated antinuclear antibody (ANA) level - Low titer and not significant.  Primary osteoarthritis of both hands-he has osteoarthritis in his bilateral hands.  X-rays obtained in the past showed  osteoarthritic changes.  X-ray findings were reviewed with the patient.  He had discomfort over [facility] joints especially his left CMC joint.  A prescription for left CMC brace was given.  Joint protection muscle strengthening was discussed.  A handout on hand exercises was given.  Patient states he had left trigger thumb in the past which responded to cortisone injection.  Pain in both feet -he has discomfort in his feet.  He has bilateral pes cavus and hammertoes.  X-rays showed osteoarthritis changes.  Possible cystic versus erosive changes were noted in the right fifth MTP joint.  X-ray findings were reviewed.  Patient has no symptoms in that toe.  Proper fitting shoes with arch support and metatarsal arches were discussed.  Primary hypertension-blood pressure was 136/74 today.  Other medical problems are listed as follows:  Acquired hypothyroidism  History of tongue cancer - 2013, CTX, RTX, laser surgery-follow up x 10 yeras.He goes to ENT now  Testosterone deficiency  Other specified hearing loss of both ears  OSA on CPAP  Family history of abdominal aortic aneurysm (AAA)  Orders: No orders of the defined types were placed in this encounter.  No orders of the defined types were placed in this encounter.    Follow-Up Instructions: Return in about 6 months (around 03/21/2024) for Gout, Osteoarthritis.   Pollyann Savoy, MD  Note - This record has been created using Animal nutritionist.  Chart creation errors have been sought, but may not always  have been located. Such creation errors do not reflect on  the standard of medical care.

## 2023-09-18 ENCOUNTER — Encounter: Payer: Self-pay | Admitting: Ophthalmology

## 2023-09-18 DIAGNOSIS — H2512 Age-related nuclear cataract, left eye: Secondary | ICD-10-CM | POA: Diagnosis not present

## 2023-09-18 DIAGNOSIS — H2513 Age-related nuclear cataract, bilateral: Secondary | ICD-10-CM | POA: Diagnosis not present

## 2023-09-19 ENCOUNTER — Encounter: Payer: Self-pay | Admitting: Ophthalmology

## 2023-09-19 NOTE — Anesthesia Preprocedure Evaluation (Addendum)
 Anesthesia Evaluation  Patient identified by MRN, date of birth, ID band Patient awake    Reviewed: Allergy & Precautions, H&P , NPO status , Patient's Chart, lab work & pertinent test results  Airway Mallampati: II  TM Distance: <3 FB Neck ROM: Full    Dental no notable dental hx. (+) Caps Caps entire upper front row of teeth:   Pulmonary neg pulmonary ROS, sleep apnea , former smoker   Pulmonary exam normal breath sounds clear to auscultation       Cardiovascular hypertension, negative cardio ROS Normal cardiovascular exam Rhythm:Regular Rate:Normal     Neuro/Psych negative neurological ROS  negative psych ROS   GI/Hepatic negative GI ROS, Neg liver ROS,,,  Endo/Other  negative endocrine ROSHypothyroidism    Renal/GU negative Renal ROS  negative genitourinary   Musculoskeletal negative musculoskeletal ROS (+)    Abdominal   Peds negative pediatric ROS (+)  Hematology negative hematology ROS (+)   Anesthesia Other Findings Hypertension  Low testosterone Sleep apnea "Severe" per patient, is both central and obstructive sleep apnea Actinic keratosis Wears hearing aid in both ears  Hx oropharyngeal cancer Chronic gout   Reproductive/Obstetrics negative OB ROS                             Anesthesia Physical Anesthesia Plan  ASA: 2  Anesthesia Plan: MAC   Post-op Pain Management:    Induction: Intravenous  PONV Risk Score and Plan:   Airway Management Planned: Natural Airway and Nasal Cannula  Additional Equipment:   Intra-op Plan:   Post-operative Plan:   Informed Consent: I have reviewed the patients History and Physical, chart, labs and discussed the procedure including the risks, benefits and alternatives for the proposed anesthesia with the patient or authorized representative who has indicated his/her understanding and acceptance.     Dental Advisory Given  Plan  Discussed with: Anesthesiologist, CRNA and Surgeon  Anesthesia Plan Comments: (Patient consented for risks of anesthesia including but not limited to:  - adverse reactions to medications - damage to eyes, teeth, lips or other oral mucosa - nerve damage due to positioning  - sore throat or hoarseness - Damage to heart, brain, nerves, lungs, other parts of body or loss of life  Patient voiced understanding and assent.)       Anesthesia Quick Evaluation

## 2023-09-20 ENCOUNTER — Ambulatory Visit: Payer: Medicare PPO | Attending: Rheumatology | Admitting: Rheumatology

## 2023-09-20 ENCOUNTER — Encounter: Payer: Self-pay | Admitting: Rheumatology

## 2023-09-20 VITALS — BP 136/74 | HR 51 | Resp 15 | Ht 68.0 in | Wt 203.0 lb

## 2023-09-20 DIAGNOSIS — M19041 Primary osteoarthritis, right hand: Secondary | ICD-10-CM

## 2023-09-20 DIAGNOSIS — R768 Other specified abnormal immunological findings in serum: Secondary | ICD-10-CM

## 2023-09-20 DIAGNOSIS — H918X3 Other specified hearing loss, bilateral: Secondary | ICD-10-CM | POA: Diagnosis not present

## 2023-09-20 DIAGNOSIS — I10 Essential (primary) hypertension: Secondary | ICD-10-CM

## 2023-09-20 DIAGNOSIS — M79672 Pain in left foot: Secondary | ICD-10-CM

## 2023-09-20 DIAGNOSIS — M79671 Pain in right foot: Secondary | ICD-10-CM

## 2023-09-20 DIAGNOSIS — E349 Endocrine disorder, unspecified: Secondary | ICD-10-CM | POA: Diagnosis not present

## 2023-09-20 DIAGNOSIS — Z85810 Personal history of malignant neoplasm of tongue: Secondary | ICD-10-CM

## 2023-09-20 DIAGNOSIS — M19042 Primary osteoarthritis, left hand: Secondary | ICD-10-CM

## 2023-09-20 DIAGNOSIS — E039 Hypothyroidism, unspecified: Secondary | ICD-10-CM

## 2023-09-20 DIAGNOSIS — M1A09X0 Idiopathic chronic gout, multiple sites, without tophus (tophi): Secondary | ICD-10-CM | POA: Diagnosis not present

## 2023-09-20 DIAGNOSIS — G4733 Obstructive sleep apnea (adult) (pediatric): Secondary | ICD-10-CM

## 2023-09-20 DIAGNOSIS — Z8249 Family history of ischemic heart disease and other diseases of the circulatory system: Secondary | ICD-10-CM

## 2023-09-20 NOTE — Patient Instructions (Addendum)
 Information for patients with Gout  Gout defined-Gout occurs when urate crystals accumulate in your joint causing the inflammation and intense pain of gout attack.  Urate crystals can form when you have high levels of uric acid in your blood.  Your body produces uric acid when it breaks down prurines-substances that are found naturally in your body, as well as in certain foods such as organ meats, anchioves, herring, asparagus, and mushrooms.  Normally uric acid dissolves in your blood and passes through your kidneys into your urine.  But sometimes your body either produces too much uric acid or your kidneys excrete too little uric acid.  When this happens, uric acid can build up, forming sharp needle-like urate crystals in a joint or surrounding tissue that cause pain, inflammation and swelling.    Gout is characterized by sudden, severe attacks of pain, redness and tenderness in joints, often the joint at the base of the big toe.  Gout is complex form of arthritis that can affect anyone.  Men are more likely to get gout but women become increasingly more susceptible to gout after menopause.  An acute attack of gout can wake you up in the middle of the night with the sensation that your big toe is on fire.  The affected joint is hot, swollen and so tender that even the weight or the sheet on it may seem intolerable.  If you experience symptoms of an acute gout attack it is important to your doctor as soon as the symptoms start.  Gout that goes untreated can lead to worsening pain and joint damage.  Risk Factors:  You are more likely to develop gout if you have high levels of uric acid in your body.    Factors that increase the uric acid level in your body include:  Lifestyle factors.  Excessive alcohol use-generally more than two drinks a day for men and more than one for women increase the risk of gout.  Medical conditions.  Certain conditions make it more likely that you will develop gout.   These include hypertension, and chronic conditions such as diabetes, high levels of fat and cholesterol in the blood, and narrowing of the arteries.  Certain medications.  The uses of Thiazide diuretics- commonly used to treat hypertension and low dose aspirin can also increase uric acid levels.  Family history of gout.  If other members of your family have had gout, you are more likely to develop the disease.  Age and sex. Gout occurs more often in men than it does in women, primarily because women tend to have lower uric acid levels than men do.  Men are more likely to develop gout earlier usually between the ages of 71-50- whereas women generally develop signs and symptoms after menopause.    Tests and diagnosis:  Tests to help diagnose gout may include:  Blood test.  Your doctor may recommend a blood test to measure the uric acid level in your blood .  Blood tests can be misleading, though.  Some people have high uric acid levels but never experience gout.  And some people have signs and symptoms of gout, but don't have unusual levels of uric acid in their blood.  Joint fluid test.  Your doctor may use a needle to draw fluid from your affected joint.  When examined under the microscope, your joint fluid may reveal urate crystals.  Treatment:  Treatment for gout usually involves medications.  What medications you and your doctor choose will be  based on your current health and other medications you currently take.  Gout medications can be used to treat acute gout attacks and prevent future attacks as well as reduce your risk of complications from gout such as the development of tophi from urate crystal deposits.  Alternative medicine:   Certain foods have been studied for their potential to lower uric acid levels, including:  Coffee.  Studies have found an association between coffee drinking (regular and decaf) and lower uric acid levels.  The evidence is not enough to encourage  non-coffee drinkers to start, but it may give clues to new ways of treating gout in the future.  Vitamin C.  Supplements containing vitamin C may reduce the levels of uric acid in your blood.  However, vitamin as a treatment for gout. Don't assume that if a little vitamin C is good, than lots is better.  Megadoses of vitamin C may increase your bodies uric acid levels.  Cherries.  Cherries have been associated with lower levels of uric acid in studies, but it isn't clear if they have any effect on gout signs and symptoms.  Eating more cherries and other dar-colored fruits, such as blackberries, blueberries, purple grapes and raspberries, may be a safe way to support your gout treatment.    Lifestyle/Diet Recommendations:  Drink 8 to 16 cups ( about 2 to 4 liters) of fluid each day, with at least half being water. Avoid alcohol Eat a moderate amount of protein, preferably from healthy sources, such as low-fat or fat-free dairy, tofu, eggs, and nut butters. Limit you daily intake of meat, fish, and poultry to 4 to 6 ounces. Avoid high fat meats and desserts. Decrease you intake of shellfish, beef, lamb, pork, eggs and cheese. Choose a good source of vitamin C daily such as citrus fruits, strawberries, broccoli,  brussel sprouts, papaya, and cantaloupe.  Choose a good source of vitamin A every other day such as yellow fruits, or dark green/yellow vegetables. Avoid drastic weigh reduction or fasting.  If weigh loss is desired lose it over a period of several months. See "dietary considerations.." chart for specific food recommendations.  Dietary Considerations for people with Gout  Food with negligible purine content (0-15 mg of purine nitrogen per 100 grams food)  May use as desired except on calorie variations  Non fat milk Cocoa Cereals (except in list II) Hard candies  Buttermilk Carbonated drinks Vegetables (except in list II) Sherbet  Coffee Fruits Sugar Honey  Tea Cottage Cheese  Gelatin-jell-o Salt  Fruit juice Breads Angel food Cake   Herbs/spices Jams/Jellies [employer]    Foods that do not contain excessive purine content, but must be limited due to fat content  Cream Eggs Oil and Salad Dressing  Half and Half Peanut Butter Chocolate  Whole Milk Cakes Potato Chips  Butter Ice Cream Fried Foods  Cheese Nuts Waffles, pancakes   List II: Food with moderate purine content (50-150 mg of purine nitrogen per 100 grams of food)  Limit total amount each day to 5 oz. cooked Lean meat, other than those on list III   Poultry, other than those on list III Fish, other than those on list III   Seafood, other than those on list III  These foods may be used occasionally  Peas Lentils Bran  Spinach Oatmeal Dried Beans and Peas  Asparagus Wheat Germ Mushrooms   Additional information about meat choices  Choose fish and poultry, particularly without skin, often.  Select lean, well  trimmed cuts of meat.  Avoid all fatty meats, bacon , sausage, fried meats, fried fish, or poultry, luncheon meats, cold cuts, hot dogs, meats canned or frozen in gravy, spareribs and frozen and packaged prepared meats.   List III: Foods with HIGH purine content / Foods to AVOID (150-800 mg of purine nitrogen per 100 grams of food)  Anchovies Herring Meat Broths  Liver Mackerel Meat Extracts  Kidney Scallops Meat Drippings  Sardines Wild Game Mincemeat  Sweetbreads Goose Gravy  Heart Tongue Yeast, baker's and brewers   Commercial soups made with any of the foods listed in List II or List III  In addition avoid all alcoholic beverages   Hand Exercises Hand exercises can be helpful for almost anyone. They can strengthen your hands and improve flexibility and movement. The exercises can also increase blood flow to the hands. These results can make your work and daily tasks easier for you. Hand exercises can be especially helpful for people who have joint pain from  arthritis or nerve damage from using their hands over and over. These exercises can also help people who injure a hand. Exercises Most of these hand exercises are gentle stretching and motion exercises. It is usually safe to do them often throughout the day. Warming up your hands before exercise may help reduce stiffness. You can do this with gentle massage or by placing your hands in warm water for 10-15 minutes. It is normal to feel some stretching, pulling, tightness, or mild discomfort when you begin new exercises. In time, this will improve. Remember to always be careful and stop right away if you feel sudden, very bad pain or your pain gets worse. You want to get better and be safe. Ask your health care provider which exercises are safe for you. Do exercises exactly as told by your provider and adjust them as told. Do not begin these exercises until told by your provider. Knuckle bend or "claw" fist  Stand or sit with your arm, hand, and all five fingers pointed straight up. Make sure to keep your wrist straight. Gently bend your fingers down toward your palm until the tips of your fingers are touching your palm. Keep your big knuckle straight and only bend the small knuckles in your fingers. Hold this position for 10 seconds. Straighten your fingers back to your starting position. Repeat this exercise 5-10 times with each hand. Full finger fist  Stand or sit with your arm, hand, and all five fingers pointed straight up. Make sure to keep your wrist straight. Gently bend your fingers into your palm until the tips of your fingers are touching the middle of your palm. Hold this position for 10 seconds. Extend your fingers back to your starting position, stretching every joint fully. Repeat this exercise 5-10 times with each hand. Straight fist  Stand or sit with your arm, hand, and all five fingers pointed straight up. Make sure to keep your wrist straight. Gently bend your fingers at the  big knuckle, where your fingers meet your hand, and at the middle knuckle. Keep the knuckle at the tips of your fingers straight and try to touch the bottom of your palm. Hold this position for 10 seconds. Extend your fingers back to your starting position, stretching every joint fully. Repeat this exercise 5-10 times with each hand. Tabletop  Stand or sit with your arm, hand, and all five fingers pointed straight up. Make sure to keep your wrist straight. Gently bend your fingers at the  big knuckle, where your fingers meet your hand, as far down as you can. Keep the small knuckles in your fingers straight. Think of forming a tabletop with your fingers. Hold this position for 10 seconds. Extend your fingers back to your starting position, stretching every joint fully. Repeat this exercise 5-10 times with each hand. Finger spread  Place your hand flat on a table with your palm facing down. Make sure your wrist stays straight. Spread your fingers and thumb apart from each other as far as you can until you feel a gentle stretch. Hold this position for 10 seconds. Bring your fingers and thumb tight together again. Hold this position for 10 seconds. Repeat this exercise 5-10 times with each hand. Making circles  Stand or sit with your arm, hand, and all five fingers pointed straight up. Make sure to keep your wrist straight. Make a circle by touching the tip of your thumb to the tip of your index finger. Hold for 10 seconds. Then open your hand wide. Repeat this motion with your thumb and each of your fingers. Repeat this exercise 5-10 times with each hand. Thumb motion  Sit with your forearm resting on a table and your wrist straight. Your thumb should be facing up toward the ceiling. Keep your fingers relaxed as you move your thumb. Lift your thumb up as high as you can toward the ceiling. Hold for 10 seconds. Bend your thumb across your palm as far as you can, reaching the tip of your thumb  for the small finger (pinkie) side of your palm. Hold for 10 seconds. Repeat this exercise 5-10 times with each hand. Grip strengthening  Hold a stress ball or other soft ball in the middle of your hand. Slowly increase the pressure, squeezing the ball as much as you can without causing pain. Think of bringing the tips of your fingers into the middle of your palm. All of your finger joints should bend when doing this exercise. Hold your squeeze for 10 seconds, then relax. Repeat this exercise 5-10 times with each hand. Contact a health care provider if: Your hand pain or discomfort gets much worse when you do an exercise. Your hand pain or discomfort does not improve within 2 hours after you exercise. If you have either of these problems, stop doing these exercises right away. Do not do them again unless your provider says that you can. Get help right away if: You develop sudden, severe hand pain or swelling. If this happens, stop doing these exercises right away. Do not do them again unless your provider says that you can. This information is not intended to replace advice given to you by your health care provider. Make sure you discuss any questions you have with your health care provider. Document Revised: 06/14/2022 Document Reviewed: 06/14/2022 Elsevier Patient Education  2024 [employer].

## 2023-09-26 NOTE — Discharge Instructions (Signed)

## 2023-09-27 ENCOUNTER — Encounter: Payer: Self-pay | Admitting: Ophthalmology

## 2023-09-27 ENCOUNTER — Other Ambulatory Visit: Payer: Self-pay

## 2023-09-27 ENCOUNTER — Ambulatory Visit: Payer: Self-pay | Admitting: Anesthesiology

## 2023-09-27 ENCOUNTER — Encounter
Admission: RE | Disposition: A | Discharge status: Home / Self Care | Payer: Self-pay | Source: Home / Self Care | Attending: Ophthalmology

## 2023-09-27 ENCOUNTER — Ambulatory Visit
Admission: RE | Admit: 2023-09-27 | Discharge: 2023-09-27 | Disposition: A | Discharge status: Home / Self Care | Attending: Ophthalmology | Admitting: Ophthalmology

## 2023-09-27 DIAGNOSIS — G4733 Obstructive sleep apnea (adult) (pediatric): Secondary | ICD-10-CM | POA: Diagnosis not present

## 2023-09-27 DIAGNOSIS — H2512 Age-related nuclear cataract, left eye: Secondary | ICD-10-CM | POA: Diagnosis not present

## 2023-09-27 DIAGNOSIS — Z87891 Personal history of nicotine dependence: Secondary | ICD-10-CM | POA: Diagnosis not present

## 2023-09-27 DIAGNOSIS — I10 Essential (primary) hypertension: Secondary | ICD-10-CM | POA: Insufficient documentation

## 2023-09-27 DIAGNOSIS — E039 Hypothyroidism, unspecified: Secondary | ICD-10-CM | POA: Diagnosis not present

## 2023-09-27 HISTORY — DX: Obstructive sleep apnea (adult) (pediatric): G47.33

## 2023-09-27 HISTORY — PX: CATARACT EXTRACTION W/PHACO: SHX586

## 2023-09-27 HISTORY — DX: Idiopathic gout, unspecified site: M10.00

## 2023-09-27 HISTORY — DX: Presence of external hearing-aid: Z97.4

## 2023-09-27 HISTORY — DX: Primary central sleep apnea: G47.31

## 2023-09-27 SURGERY — PHACOEMULSIFICATION, CATARACT, WITH IOL INSERTION
Anesthesia: Monitor Anesthesia Care | Site: Eye | Laterality: Left

## 2023-09-27 MED ORDER — LIDOCAINE HCL (PF) 2 % IJ SOLN
INTRAOCULAR | Status: DC | PRN
  Administered 2023-09-27: 2 mL

## 2023-09-27 MED ORDER — CEFUROXIME OPHTHALMIC INJECTION 1 MG/0.1 ML
INJECTION | OPHTHALMIC | Status: DC | PRN
  Administered 2023-09-27: 1 mg via INTRACAMERAL

## 2023-09-27 MED ORDER — TETRACAINE HCL 0.5 % OP SOLN
1.0000 [drp] | OPHTHALMIC | Status: DC | PRN
  Administered 2023-09-27 (×3): 1 [drp] via OPHTHALMIC

## 2023-09-27 MED ORDER — SIGHTPATH DOSE#1 BSS IO SOLN
INTRAOCULAR | Status: DC | PRN
  Administered 2023-09-27: 15 mL via INTRAOCULAR

## 2023-09-27 MED ORDER — MIDAZOLAM HCL 2 MG/2ML IJ SOLN
INTRAMUSCULAR | Status: DC | PRN
  Administered 2023-09-27: 2 mg via INTRAVENOUS

## 2023-09-27 MED ORDER — TETRACAINE HCL 0.5 % OP SOLN
OPHTHALMIC | Status: AC
Start: 2023-09-27 — End: ?
  Filled 2023-09-27: qty 4

## 2023-09-27 MED ORDER — ARMC OPHTHALMIC DILATING DROPS
OPHTHALMIC | Status: AC
  Filled 2023-09-27: qty 0.5

## 2023-09-27 MED ORDER — SIGHTPATH DOSE#1 NA HYALUR & NA CHOND-NA HYALUR IO KIT
PACK | INTRAOCULAR | Status: DC | PRN
  Administered 2023-09-27: 1 via OPHTHALMIC

## 2023-09-27 MED ORDER — FENTANYL CITRATE (PF) 100 MCG/2ML IJ SOLN
INTRAMUSCULAR | Status: DC | PRN
  Administered 2023-09-27: 50 ug via INTRAVENOUS

## 2023-09-27 MED ORDER — FENTANYL CITRATE (PF) 100 MCG/2ML IJ SOLN
INTRAMUSCULAR | Status: AC
  Filled 2023-09-27: qty 2

## 2023-09-27 MED ORDER — ARMC OPHTHALMIC DILATING DROPS
1.0000 | OPHTHALMIC | Status: DC | PRN
  Administered 2023-09-27 (×3): 1 via OPHTHALMIC

## 2023-09-27 MED ORDER — SIGHTPATH DOSE#1 BSS IO SOLN
INTRAOCULAR | Status: DC | PRN
  Administered 2023-09-27: 82 mL via OPHTHALMIC

## 2023-09-27 MED ORDER — MIDAZOLAM HCL 2 MG/2ML IJ SOLN
INTRAMUSCULAR | Status: AC
Start: 2023-09-27 — End: ?
  Filled 2023-09-27: qty 2

## 2023-09-27 MED ORDER — BRIMONIDINE TARTRATE-TIMOLOL 0.2-0.5 % OP SOLN
OPHTHALMIC | Status: DC | PRN
  Administered 2023-09-27: 1 [drp] via OPHTHALMIC

## 2023-09-27 SURGICAL SUPPLY — 11 items
CATARACT SUITE SIGHTPATH (MISCELLANEOUS) ×1 IMPLANT
FEE CATARACT SUITE SIGHTPATH (MISCELLANEOUS) ×1 IMPLANT
GLOVE BIOGEL PI IND STRL 8 (GLOVE) ×1 IMPLANT
GLOVE SURG LX STRL 7.5 STRW (GLOVE) ×1 IMPLANT
GLOVE SURG PROTEXIS BL SZ6.5 (GLOVE) ×1 IMPLANT
GLOVE SURG SYN 6.5 PF PI BL (GLOVE) ×1 IMPLANT
LENS IOL CLAREON VIVITY 18.0 ×1 IMPLANT
LENS IOL CLRN VT YLW 18.0 IMPLANT
NDL FILTER BLUNT 18X1 1/2 (NEEDLE) ×1 IMPLANT
NEEDLE FILTER BLUNT 18X1 1/2 (NEEDLE) ×1 IMPLANT
SYR 3ML LL SCALE MARK (SYRINGE) ×1 IMPLANT

## 2023-09-27 NOTE — H&P (Signed)
 [facility]   Primary Care Physician:  Tower, Manley Seeds, MD Ophthalmologist: Dr. Annell Kidney  Pre-Procedure History & Physical: HPI:  Austin Knapp is a 78 y.o. male here for ophthalmic surgery.   Past Medical History:  Diagnosis Date   Actinic keratosis    Central sleep apnea    Hypertension    Idiopathic gout    Low testosterone    Obstructive sleep apnea    Sleep apnea    sever - BiPAP. 56 Hypopneas/hr   Wears hearing aid in both ears     Past Surgical History:  Procedure Laterality Date   COLONOSCOPY WITH PROPOFOL N/A 01/19/2015   Procedure: COLONOSCOPY WITH PROPOFOL;  Surgeon: Cassie Click, MD;  Location: [facility] ENDOSCOPY;  Service: Endoscopy;  Laterality: N/A;   HERNIA REPAIR Right 08/2012   MASTOIDECTOMY     tongue excision     04/2012   VASECTOMY      Prior to Admission medications   Medication Sig Start Date End Date Taking? Authorizing Provider  ANDROGEL PUMP 20.25 MG/ACT (1.62%) GEL APPLY 4 PUMPS ONCE DAILY AS DIRECTED. 11/18/13  Yes Aron, Talia M, MD  aspirin 81 MG tablet Take 81 mg by mouth daily.   Yes [provider]  cetirizine (ZYRTEC) 10 MG tablet Take 10 mg by mouth daily as needed for allergies.   Yes [provider]  colchicine 0.6 MG tablet Take 1 tablet (0.6 mg total) by mouth 2 (two) times daily as needed. 08/14/23  Yes Deveshwar, Clydie Darter, MD  Cyanocobalamin (VITAMIN B-12 PO) Take by mouth daily.   Yes [provider]  doxazosin (CARDURA) 8 MG tablet Take 8 mg by mouth daily.   Yes [provider]  fluticasone (FLONASE) 50 MCG/ACT nasal spray instill 1 spray into each nostril once daily 04/25/14  Yes Aron, Talia M, MD  levothyroxine (SYNTHROID) 25 MCG tablet Take 25 mcg by mouth daily before breakfast.   Yes [provider]  Melatonin 5 MG/15ML LIQD Take 15 mg by mouth at bedtime as needed.   Yes [provider]  meloxicam (MOBIC) 15 MG tablet Take 15 mg by mouth daily as needed for  pain.   Yes [provider]  Multiple Vitamin (MULTIVITAMIN) tablet Take 1 tablet by mouth daily.   Yes [provider]  tretinoin (RETIN-A) 0.025 % cream Apply pea sized amount to face nightly as tolerated 08/24/23  Yes Elta Halter, MD  Turmeric Curcumin 500 MG CAPS Take by mouth daily.   Yes [provider]  vitamin E 180 MG (400 UNITS) capsule Take 400 Units by mouth daily.   Yes [provider]  ZINC-VITAMIN C PO Take 2 capsules by mouth daily. With vitamin D, Quercetin   Yes [provider]  lisinopril-hydrochlorothiazide (PRINZIDE,ZESTORETIC) 10-12.5 MG per tablet Take 1 tablet by mouth daily.  08/26/11  [provider]    Allergies as of 09/07/2023 - Review Complete 08/24/2023  Allergen Reaction Noted   Clindamycin/lincomycin Rash 08/15/2011   Latex  01/16/2015    Family History  Problem Relation Age of Onset   Macular degeneration Mother    Glaucoma Mother    Cancer Father    AAA (abdominal aortic aneurysm) Father    Cancer Sister    Kidney cancer Brother    Healthy Son    Healthy Daughter    Healthy Daughter     Social History   Socioeconomic History   Marital status: Widowed    Spouse name:  Not on file   Number of children: Not on file   Years of education: Not on file   Highest education level: Professional school degree (e.g., MD, DDS, DVM, JD)  Occupational History   Not on file  Tobacco Use   Smoking status: Former    Current packs/day: 0.00    Types: Cigarettes    Quit date: 1990    Years since quitting: 35.3    Passive exposure: Never   Smokeless tobacco: Never   Tobacco comments:    Quit over 35 years ago. Smoked off and on for for approx 18 yrs  Vaping Use   Vaping status: Never Used  Substance and Sexual Activity   Alcohol use: Yes    Comment: rare   Drug use: No   Sexual activity: Yes  Other Topics Concern   Not on file  Social History Narrative   Patients desires CPR.   Has HPOA.    Social Drivers of Corporate investment banker Strain: Low Risk  (06/19/2023)   Overall Financial Resource Strain (CARDIA)    Difficulty of Paying Living Expenses: Not hard at all  Food Insecurity: No Food Insecurity (06/19/2023)   Hunger Vital Sign    Worried About Running Out of Food in the Last Year: Never true    Ran Out of Food in the Last Year: Never true  Transportation Needs: No Transportation Needs (06/19/2023)   PRAPARE - Administrator, Civil Service (Medical): No    Lack of Transportation (Non-Medical): No  Physical Activity: Insufficiently Active (06/19/2023)   Exercise Vital Sign    Days of Exercise per Week: 2 days    Minutes of Exercise per Session: 40 min  Stress: Stress Concern Present (06/19/2023)   [employer] of Occupational Health - Occupational Stress Questionnaire    Feeling of Stress : To some extent  Social Connections: Moderately Isolated (06/19/2023)   Social Connection and Isolation Panel [NHANES]    Frequency of Communication with Friends and Family: More than three times a week    Frequency of Social Gatherings with Friends and Family: More than three times a week    Attends Religious Services: Never    Database administrator or Organizations: Yes    Attends Engineer, structural: More than 4 times per year    Marital Status: Widowed  Intimate Partner Violence: Not At Risk (06/15/2023)   Humiliation, Afraid, Rape, and Kick questionnaire    Fear of Current or Ex-Partner: No    Emotionally Abused: No    Physically Abused: No    Sexually Abused: No    Review of Systems: See HPI, otherwise negative ROS  Physical Exam: BP (!) 147/86   Pulse 63   Temp 97.8 F (36.6 C) (Temporal)   Resp 18   Ht 5\' 8"  (1.727 m)   Wt 89.8 kg   SpO2 98%   BMI 30.11 kg/m  General:   Alert,  pleasant and cooperative in NAD Head:  Normocephalic and atraumatic. Lungs:  Clear to auscultation.    Heart:  Regular rate and rhythm.    Impression/Plan: Austin Knapp is here for ophthalmic surgery.  Risks, benefits, limitations, and alternatives regarding ophthalmic surgery have been reviewed with the patient.  Questions have been answered.  All parties agreeable.   Lockie Mola, MD  09/27/2023, 9:41 AM

## 2023-09-27 NOTE — Transfer of Care (Signed)
 Immediate Anesthesia Transfer of Care Note  Patient: Austin Knapp  Procedure(s) Performed: PHACOEMULSIFICATION, CATARACT, WITH IOL INSERTION 7.88 00:46.5 (Left: Eye)  Patient Location: PACU  Anesthesia Type: MAC  Level of Consciousness: awake, alert  and patient cooperative  Airway and Oxygen Therapy: Patient Spontanous Breathing and Patient connected to supplemental oxygen  Post-op Assessment: Post-op Vital signs reviewed, Patient's Cardiovascular Status Stable, Respiratory Function Stable, Patent Airway and No signs of Nausea or vomiting  Post-op Vital Signs: Reviewed and stable  Complications: No notable events documented.

## 2023-09-27 NOTE — Op Note (Signed)
 OPERATIVE NOTE  [name] [id number] 09/27/2023   PREOPERATIVE DIAGNOSIS:  Nuclear sclerotic cataract left eye. H25.12   POSTOPERATIVE DIAGNOSIS:    Nuclear sclerotic cataract left eye.     PROCEDURE:  Phacoemusification with posterior chamber intraocular lens placement of the left eye  Ultrasound time: Procedure(s): PHACOEMULSIFICATION, CATARACT, WITH IOL INSERTION 7.88 00:46.5 (Left)  LENS:   Implant Name Type Inv. Item Serial No. Manufacturer Lot No. LRB No. Used Action  LENS IOL CLAREON VIVITY 18.0 - [id number]  LENS IOL CLAREON VIVITY 18.0 [id number] SIGHTPATH  Left 1 Implanted    [id number]  SURGEON:  Berline Brenner, MD   ANESTHESIA:  Topical with tetracaine drops and 2% Xylocaine jelly, augmented with 1% preservative-free intracameral lidocaine.    COMPLICATIONS:  None.   DESCRIPTION OF PROCEDURE:  The patient was identified in the holding room and transported to the operating room and placed in the supine position under the operating microscope.  The left eye was identified as the operative eye and it was prepped and draped in the usual sterile ophthalmic fashion.   A 1 millimeter clear-corneal paracentesis was made at the 1:30 position.  0.5 ml of preservative-free 1% lidocaine was injected into the anterior chamber.  The anterior chamber was filled with Viscoat viscoelastic.  A 2.4 millimeter keratome was used to make a near-clear corneal incision at the 10:30 position.  .  A curvilinear capsulorrhexis was made with a cystotome and capsulorrhexis forceps.  Balanced salt solution was used to hydrodissect and hydrodelineate the nucleus.   Phacoemulsification was then used in stop and chop fashion to remove the lens nucleus and epinucleus.  The remaining cortex was then removed using the irrigation and aspiration handpiece. Provisc was then placed into the capsular bag to distend it for lens placement.  A lens was then injected into the capsular bag.  The remaining  viscoelastic was aspirated.   Wounds were hydrated with balanced salt solution.  The anterior chamber was inflated to a physiologic pressure with balanced salt solution.  No wound leaks were noted. Cefuroxime 0.1 ml of a 10mg /ml solution was injected into the anterior chamber for a dose of 1 mg of intracameral antibiotic at the completion of the case.   Timolol and Brimonidine drops were applied to the eye.  The patient was taken to the recovery room in stable condition without complications of anesthesia or surgery.  [name] 09/27/2023, 10:20 AM

## 2023-09-27 NOTE — Anesthesia Postprocedure Evaluation (Signed)
 Anesthesia Post Note  Patient: Austin Knapp  Procedure(s) Performed: PHACOEMULSIFICATION, CATARACT, WITH IOL INSERTION 7.88 00:46.5 (Left: Eye)  Patient location during evaluation: PACU Anesthesia Type: MAC Level of consciousness: awake and alert Pain management: pain level controlled Vital Signs Assessment: post-procedure vital signs reviewed and stable Respiratory status: spontaneous breathing, nonlabored ventilation, respiratory function stable and patient connected to nasal cannula oxygen Cardiovascular status: stable and blood pressure returned to baseline Postop Assessment: no apparent nausea or vomiting Anesthetic complications: no   No notable events documented.   Last Vitals:  Vitals:   09/27/23 1025 09/27/23 1027  BP: 107/88 113/81  Pulse: 60 (!) 58  Resp: 10 13  Temp:  (!) 36.1 C  SpO2: 94% 95%    Last Pain:  Vitals:   09/27/23 1027  TempSrc:   PainSc: 0-No pain                 Shekira Drummer C [employer] [employer]

## 2023-09-28 DIAGNOSIS — H2511 Age-related nuclear cataract, right eye: Secondary | ICD-10-CM | POA: Diagnosis not present

## 2023-10-03 NOTE — Anesthesia Preprocedure Evaluation (Addendum)
 Anesthesia Evaluation  Patient identified by MRN, date of birth, ID band Patient awake    Reviewed: Allergy & Precautions, H&P , NPO status , Patient's Chart, lab work & pertinent test results  Airway Mallampati: II  TM Distance: <3 FB     Dental  (+) Caps Caps entire upper front row of teeth:   Pulmonary neg pulmonary ROS, sleep apnea , former smoker          Cardiovascular hypertension, negative cardio ROS      Neuro/Psych negative neurological ROS  negative psych ROS   GI/Hepatic negative GI ROS, Neg liver ROS,,,  Endo/Other  negative endocrine ROSHypothyroidism    Renal/GU negative Renal ROS  negative genitourinary   Musculoskeletal negative musculoskeletal ROS (+)    Abdominal   Peds negative pediatric ROS (+)  Hematology negative hematology ROS (+)   Anesthesia Other Findings  Previous cataract surgery 09-27-23 Dr. Aldo Amble   Hypertension  Low testosterone  Sleep apnea  Actinic keratosis Wears hearing aid in both ears Idiopathic gout Central sleep apnea  Obstructive sleep apnea    Reproductive/Obstetrics negative OB ROS                             Anesthesia Physical Anesthesia Plan  ASA: 2  Anesthesia Plan: MAC   Post-op Pain Management:    Induction: Intravenous  PONV Risk Score and Plan:   Airway Management Planned: Natural Airway and Nasal Cannula  Additional Equipment:   Intra-op Plan:   Post-operative Plan:   Informed Consent: I have reviewed the patients History and Physical, chart, labs and discussed the procedure including the risks, benefits and alternatives for the proposed anesthesia with the patient or authorized representative who has indicated his/her understanding and acceptance.     Dental Advisory Given  Plan Discussed with: Anesthesiologist, CRNA and Surgeon  Anesthesia Plan Comments: (Patient consented for risks of anesthesia including  but not limited to:  - adverse reactions to medications - damage to eyes, teeth, lips or other oral mucosa - nerve damage due to positioning  - sore throat or hoarseness - Damage to heart, brain, nerves, lungs, other parts of body or loss of life  Patient voiced understanding and assent.)        Anesthesia Quick Evaluation

## 2023-10-04 DIAGNOSIS — Z85810 Personal history of malignant neoplasm of tongue: Secondary | ICD-10-CM | POA: Diagnosis not present

## 2023-10-04 DIAGNOSIS — H6123 Impacted cerumen, bilateral: Secondary | ICD-10-CM | POA: Diagnosis not present

## 2023-10-04 DIAGNOSIS — J301 Allergic rhinitis due to pollen: Secondary | ICD-10-CM | POA: Diagnosis not present

## 2023-10-04 DIAGNOSIS — R1319 Other dysphagia: Secondary | ICD-10-CM | POA: Diagnosis not present

## 2023-10-10 NOTE — Discharge Instructions (Signed)

## 2023-10-11 ENCOUNTER — Other Ambulatory Visit: Payer: Self-pay

## 2023-10-11 ENCOUNTER — Ambulatory Visit: Admitting: Anesthesiology

## 2023-10-11 ENCOUNTER — Encounter
Admission: RE | Disposition: A | Discharge status: Home / Self Care | Payer: Self-pay | Source: Home / Self Care | Attending: Ophthalmology

## 2023-10-11 ENCOUNTER — Encounter: Payer: Self-pay | Admitting: Ophthalmology

## 2023-10-11 ENCOUNTER — Ambulatory Visit
Admission: RE | Admit: 2023-10-11 | Discharge: 2023-10-11 | Disposition: A | Discharge status: Home / Self Care | Attending: Ophthalmology | Admitting: Ophthalmology

## 2023-10-11 DIAGNOSIS — G4733 Obstructive sleep apnea (adult) (pediatric): Secondary | ICD-10-CM | POA: Insufficient documentation

## 2023-10-11 DIAGNOSIS — I10 Essential (primary) hypertension: Secondary | ICD-10-CM | POA: Insufficient documentation

## 2023-10-11 DIAGNOSIS — Z87891 Personal history of nicotine dependence: Secondary | ICD-10-CM | POA: Diagnosis not present

## 2023-10-11 DIAGNOSIS — H2511 Age-related nuclear cataract, right eye: Secondary | ICD-10-CM | POA: Diagnosis not present

## 2023-10-11 HISTORY — PX: CATARACT EXTRACTION W/PHACO: SHX586

## 2023-10-11 SURGERY — PHACOEMULSIFICATION, CATARACT, WITH IOL INSERTION
Anesthesia: Monitor Anesthesia Care | Site: Eye | Laterality: Right

## 2023-10-11 MED ORDER — ARMC OPHTHALMIC DILATING DROPS
1.0000 | OPHTHALMIC | Status: DC | PRN
  Administered 2023-10-11 (×3): 1 via OPHTHALMIC

## 2023-10-11 MED ORDER — SIGHTPATH DOSE#1 NA HYALUR & NA CHOND-NA HYALUR IO KIT
PACK | INTRAOCULAR | Status: DC | PRN
  Administered 2023-10-11: 1 via OPHTHALMIC

## 2023-10-11 MED ORDER — FENTANYL CITRATE (PF) 100 MCG/2ML IJ SOLN
INTRAMUSCULAR | Status: AC
  Filled 2023-10-11: qty 2

## 2023-10-11 MED ORDER — SIGHTPATH DOSE#1 BSS IO SOLN
INTRAOCULAR | Status: DC | PRN
  Administered 2023-10-11: 64 mL via OPHTHALMIC

## 2023-10-11 MED ORDER — MIDAZOLAM HCL 2 MG/2ML IJ SOLN
INTRAMUSCULAR | Status: DC | PRN
  Administered 2023-10-11: 1 mg via INTRAVENOUS

## 2023-10-11 MED ORDER — SODIUM CHLORIDE 0.9% FLUSH
INTRAVENOUS | Status: DC | PRN
  Administered 2023-10-11 (×2): 10 mL via INTRAVENOUS

## 2023-10-11 MED ORDER — MIDAZOLAM HCL 2 MG/2ML IJ SOLN
INTRAMUSCULAR | Status: AC
  Filled 2023-10-11: qty 2

## 2023-10-11 MED ORDER — CEFUROXIME OPHTHALMIC INJECTION 1 MG/0.1 ML
INJECTION | OPHTHALMIC | Status: DC | PRN
  Administered 2023-10-11: 1 mg via INTRACAMERAL

## 2023-10-11 MED ORDER — FENTANYL CITRATE (PF) 100 MCG/2ML IJ SOLN
INTRAMUSCULAR | Status: DC | PRN
  Administered 2023-10-11 (×2): 25 ug via INTRAVENOUS

## 2023-10-11 MED ORDER — LIDOCAINE HCL (PF) 2 % IJ SOLN
INTRAOCULAR | Status: DC | PRN
  Administered 2023-10-11: 2 mL

## 2023-10-11 MED ORDER — ARMC OPHTHALMIC DILATING DROPS
OPHTHALMIC | Status: AC
  Filled 2023-10-11: qty 0.5

## 2023-10-11 MED ORDER — TETRACAINE HCL 0.5 % OP SOLN
1.0000 [drp] | OPHTHALMIC | Status: DC | PRN
Start: 2023-10-11 — End: 2023-10-11
  Administered 2023-10-11 (×3): 1 [drp] via OPHTHALMIC

## 2023-10-11 MED ORDER — BRIMONIDINE TARTRATE-TIMOLOL 0.2-0.5 % OP SOLN
OPHTHALMIC | Status: DC | PRN
  Administered 2023-10-11: 1 [drp] via OPHTHALMIC

## 2023-10-11 MED ORDER — SIGHTPATH DOSE#1 BSS IO SOLN
INTRAOCULAR | Status: DC | PRN
  Administered 2023-10-11: 15 mL via INTRAOCULAR

## 2023-10-11 MED ORDER — TETRACAINE HCL 0.5 % OP SOLN
OPHTHALMIC | Status: AC
  Filled 2023-10-11: qty 4

## 2023-10-11 SURGICAL SUPPLY — 11 items
CATARACT SUITE SIGHTPATH (MISCELLANEOUS) ×1 IMPLANT
FEE CATARACT SUITE SIGHTPATH (MISCELLANEOUS) ×1 IMPLANT
GLOVE BIOGEL PI IND STRL 8 (GLOVE) ×1 IMPLANT
GLOVE SURG LX STRL 7.5 STRW (GLOVE) ×1 IMPLANT
GLOVE SURG PROTEXIS BL SZ6.5 (GLOVE) ×1 IMPLANT
GLOVE SURG SYN 6.5 PF PI BL (GLOVE) ×1 IMPLANT
LENS CLRN VIVITY TORIC 3 18.5 ×1 IMPLANT
LENS IOL CLRN VT TRC 3 18.5 IMPLANT
NDL FILTER BLUNT 18X1 1/2 (NEEDLE) ×1 IMPLANT
NEEDLE FILTER BLUNT 18X1 1/2 (NEEDLE) ×1 IMPLANT
SYR 3ML LL SCALE MARK (SYRINGE) ×1 IMPLANT

## 2023-10-11 NOTE — Transfer of Care (Signed)
 Immediate Anesthesia Transfer of Care Note  Patient: Austin Knapp  Procedure(s) Performed: PHACOEMULSIFICATION, CATARACT, WITH IOL INSERTION 7.16 00:31.9 (Right: Eye)  Patient Location: PACU  Anesthesia Type:MAC  Level of Consciousness: awake and alert   Airway & Oxygen Therapy: Patient Spontanous Breathing and Patient connected to nasal cannula oxygen  Post-op Assessment: Report given to RN and Post -op Vital signs reviewed and stable  Post vital signs: Reviewed and stable  Last Vitals: Pt is NOT on 02 now.  Vitals Value Taken Time  BP 121/88 10/11/23 1047  Temp 36.4 C 10/11/23 1046  Pulse 57 10/11/23 1048  Resp 7 10/11/23 1048  SpO2 98 % 10/11/23 1048  Vitals shown include unfiled device data.  Last Pain:  Vitals:   10/11/23 1046  TempSrc:   PainSc: 0-No pain         Complications: No notable events documented.

## 2023-10-11 NOTE — H&P (Signed)
 [facility]   Primary Care Physician:  Tower, Manley Seeds, MD Ophthalmologist: Dr. Annell Kidney  Pre-Procedure History & Physical: HPI:  Austin Knapp is a 78 y.o. male here for ophthalmic surgery.   Past Medical History:  Diagnosis Date   Actinic keratosis    Central sleep apnea    Hypertension    Idiopathic gout    Low testosterone     Obstructive sleep apnea    Sleep apnea    sever - BiPAP. 56 Hypopneas/hr   Wears hearing aid in both ears     Past Surgical History:  Procedure Laterality Date   CATARACT EXTRACTION W/PHACO Left 09/27/2023   Procedure: PHACOEMULSIFICATION, CATARACT, WITH IOL INSERTION 7.88 00:46.5;  Surgeon: Annell Kidney, MD;  Location: [facility];  Service: Ophthalmology;  Laterality: Left;   COLONOSCOPY WITH PROPOFOL  N/A 01/19/2015   Procedure: COLONOSCOPY WITH PROPOFOL ;  Surgeon: Cassie Click, MD;  Location: [facility];  Service: Endoscopy;  Laterality: N/A;   HERNIA REPAIR Right 08/2012   MASTOIDECTOMY     tongue excision     04/2012   VASECTOMY      Prior to Admission medications   Medication Sig Start Date End Date Taking? Authorizing Provider  ANDROGEL  PUMP 20.25 MG/ACT (1.62%) GEL APPLY 4 PUMPS ONCE DAILY AS DIRECTED. 11/18/13  Yes Aron, Talia M, MD  aspirin 81 MG tablet Take 81 mg by mouth daily.   Yes [provider]  cetirizine (ZYRTEC) 10 MG tablet Take 10 mg by mouth daily as needed for allergies.   Yes [provider]  colchicine  0.6 MG tablet Take 1 tablet (0.6 mg total) by mouth 2 (two) times daily as needed. 08/14/23  Yes Deveshwar, Clydie Darter, MD  Cyanocobalamin (VITAMIN B-12 PO) Take by mouth daily.   Yes [provider]  doxazosin (CARDURA) 8 MG tablet Take 8 mg by mouth daily.   Yes [provider]  fluticasone  (FLONASE ) 50 MCG/ACT nasal spray instill 1 spray into each nostril once daily 04/25/14  Yes Aron, Talia M, MD  levothyroxine (SYNTHROID) 25 MCG tablet Take 25 mcg by  mouth daily before breakfast.   Yes [provider]  Melatonin 5 MG/15ML LIQD Take 15 mg by mouth at bedtime as needed.   Yes [provider]  meloxicam (MOBIC) 15 MG tablet Take 15 mg by mouth daily as needed for pain.   Yes [provider]  Multiple Vitamin (MULTIVITAMIN) tablet Take 1 tablet by mouth daily.   Yes [provider]  tretinoin  (RETIN-A ) 0.025 % cream Apply pea sized amount to face nightly as tolerated 08/24/23  Yes Elta Halter, MD  Turmeric Curcumin 500 MG CAPS Take by mouth daily.   Yes [provider]  vitamin E 180 MG (400 UNITS) capsule Take 400 Units by mouth daily.   Yes [provider]  ZINC-VITAMIN C PO Take 2 capsules by mouth daily. With vitamin D, Quercetin   Yes [provider]  lisinopril-hydrochlorothiazide  (PRINZIDE,ZESTORETIC) 10-12.5 MG per tablet Take 1 tablet by mouth daily.  08/26/11  [provider]    Allergies as of 09/07/2023 - Review Complete 08/24/2023  Allergen Reaction Noted   Clindamycin/lincomycin Rash 08/15/2011   Latex  01/16/2015    Family History  Problem Relation Age of Onset   Macular degeneration Mother    Glaucoma Mother    Cancer Father    AAA (abdominal aortic aneurysm) Father    Cancer Sister    Kidney cancer Brother  Healthy Son    Healthy Daughter    Healthy Daughter     Social History   Socioeconomic History   Marital status: Widowed    Spouse name: Not on file   Number of children: Not on file   Years of education: Not on file   Highest education level: Professional school degree (e.g., MD, DDS, DVM, JD)  Occupational History   Not on file  Tobacco Use   Smoking status: Former    Current packs/day: 0.00    Types: Cigarettes    Quit date: 1990    Years since quitting: 35.3    Passive exposure: Never   Smokeless tobacco: Never   Tobacco comments:    Quit over 35 years ago. Smoked off and on for for approx 18 yrs  Vaping Use    Vaping status: Never Used  Substance and Sexual Activity   Alcohol use: Yes    Comment: rare   Drug use: No   Sexual activity: Yes  Other Topics Concern   Not on file  Social History Narrative   Patients desires CPR.   Has HPOA.   Social Drivers of Corporate investment banker Strain: Low Risk  (06/19/2023)   Overall Financial Resource Strain (CARDIA)    Difficulty of Paying Living Expenses: Not hard at all  Food Insecurity: No Food Insecurity (06/19/2023)   Hunger Vital Sign    Worried About Running Out of Food in the Last Year: Never true    Ran Out of Food in the Last Year: Never true  Transportation Needs: No Transportation Needs (06/19/2023)   PRAPARE - Administrator, Civil Service (Medical): No    Lack of Transportation (Non-Medical): No  Physical Activity: Insufficiently Active (06/19/2023)   Exercise Vital Sign    Days of Exercise per Week: 2 days    Minutes of Exercise per Session: 40 min  Stress: Stress Concern Present (06/19/2023)   [employer] of Occupational Health - Occupational Stress Questionnaire    Feeling of Stress : To some extent  Social Connections: Moderately Isolated (06/19/2023)   Social Connection and Isolation Panel [NHANES]    Frequency of Communication with Friends and Family: More than three times a week    Frequency of Social Gatherings with Friends and Family: More than three times a week    Attends Religious Services: Never    Database administrator or Organizations: Yes    Attends Engineer, structural: More than 4 times per year    Marital Status: Widowed  Intimate Partner Violence: Not At Risk (06/15/2023)   Humiliation, Afraid, Rape, and Kick questionnaire    Fear of Current or Ex-Partner: No    Emotionally Abused: No    Physically Abused: No    Sexually Abused: No    Review of Systems: See HPI, otherwise negative ROS  Physical Exam: BP (!) 133/91   Pulse 69   Temp 98.2 F (36.8 C) (Temporal)   Resp 18   Ht 5\' 8"   (1.727 m)   Wt 89.8 kg   SpO2 99%   BMI 30.11 kg/m  General:   Alert,  pleasant and cooperative in NAD Head:  Normocephalic and atraumatic. Lungs:  Clear to auscultation.    Heart:  Regular rate and rhythm.   Impression/Plan: Rebeca Camps is here for ophthalmic surgery.  Risks, benefits, limitations, and alternatives regarding ophthalmic surgery have been reviewed with the patient.  Questions have been answered.  All parties agreeable.  Annell Kidney, MD  10/11/2023, 10:06 AM

## 2023-10-11 NOTE — Anesthesia Postprocedure Evaluation (Signed)
 Anesthesia Post Note  Patient: Austin Knapp  Procedure(s) Performed: PHACOEMULSIFICATION, CATARACT, WITH IOL INSERTION 7.16 00:31.9 (Right: Eye)  Patient location during evaluation: PACU Anesthesia Type: MAC Level of consciousness: awake and alert Pain management: pain level controlled Vital Signs Assessment: post-procedure vital signs reviewed and stable Respiratory status: spontaneous breathing, nonlabored ventilation, respiratory function stable and patient connected to nasal cannula oxygen Cardiovascular status: stable and blood pressure returned to baseline Postop Assessment: no apparent nausea or vomiting Anesthetic complications: no   No notable events documented.   Last Vitals:  Vitals:   10/11/23 1050 10/11/23 1051  BP: 121/88 138/88  Pulse: 61 (!) 55  Resp: 10 12  Temp:  (!) 36.4 C  SpO2: 97% 97%    Last Pain:  Vitals:   10/11/23 1051  TempSrc:   PainSc: 0-No pain                 Shan Valdes C Francessca Friis

## 2023-10-11 NOTE — Op Note (Signed)
 LOCATION:  [facility]   PREOPERATIVE DIAGNOSIS:  Nuclear sclerotic cataract of the right eye.  H25.11   POSTOPERATIVE DIAGNOSIS:  Nuclear sclerotic cataract of the right eye.   PROCEDURE:  Phacoemulsification with Toric posterior chamber intraocular lens placement of the right eye.  Ultrasound time: Procedure(s): PHACOEMULSIFICATION, CATARACT, WITH IOL INSERTION 7.16 00:31.9 (Right)  LENS:   Implant Name Type Inv. Item Serial No. Manufacturer Lot No. LRB No. Used Action  LENS CLRN VIVITY TORIC 3 18.5 - [id number]  LENS CLRN VIVITY TORIC 3 18.5 [id number] SIGHTPATH  Right 1 Implanted     [id number]  Toric intraocular lens with 1.5 diopters of cylindrical power with axis orientation at 11 degrees.   SURGEON:  Berline Brenner, MD   ANESTHESIA: Topical with tetracaine  drops and 2% Xylocaine  jelly, augmented with 1% preservative-free intracameral lidocaine . .   COMPLICATIONS:  None.   DESCRIPTION OF PROCEDURE:  The patient was identified in the holding room and transported to the operating suite and placed in the supine position under the operating microscope.  The right eye was identified as the operative eye, and it was prepped and draped in the usual sterile ophthalmic fashion.    A clear-corneal paracentesis incision was made at the 12:00 position.  0.5 ml of preservative-free 1% lidocaine  was injected into the anterior chamber. The anterior chamber was filled with Viscoat.  A 2.4 millimeter near clear corneal incision was then made at the 9:00 position.  A cystotome and capsulorrhexis forceps were then used to make a curvilinear capsulorrhexis.  Hydrodissection and hydrodelineation were then performed using balanced salt  solution.   Phacoemulsification was then used in stop and chop fashion to remove the lens, nucleus and epinucleus.  The remaining cortex was aspirated using the irrigation and aspiration handpiece.  Provisc viscoelastic was then placed into the capsular  bag to distend it for lens placement.  The Verion digital marker was used to align the implant at the intended axis.   A Toric lens was then injected into the capsular bag.  It was rotated clockwise until the axis marks on the lens were approximately 15 degrees in the counterclockwise direction to the intended alignment.  The viscoelastic was aspirated from the eye using the irrigation aspiration handpiece.  Then, a Koch spatula through the sideport incision was used to rotate the lens in a clockwise direction until the axis markings of the intraocular lens were lined up with the Verion alignment.  Balanced salt  solution was then used to hydrate the wounds. Cefuroxime  0.1 ml of a 10mg /ml solution was injected into the anterior chamber for a dose of 1 mg of intracameral antibiotic at the completion of the case.    The eye was noted to have a physiologic pressure and there was no wound leak noted.   Timolol  and Brimonidine  drops were applied to the eye.  The patient was taken to the recovery room in stable condition having had no complications of anesthesia or surgery.  Austin Knapp 10/11/2023, 10:45 AM

## 2023-10-23 ENCOUNTER — Telehealth: Payer: Self-pay | Admitting: *Deleted

## 2023-10-23 NOTE — Telephone Encounter (Signed)
 Yes, we do recommend starting on medications to lower uric acid level after a second flare.  Please schedule an appointment to discuss treatment options.

## 2023-10-23 NOTE — Telephone Encounter (Signed)
 Patient contacted the office stating he had a gout flare about 6 months ago and then was seen in our office. Patient states over the weekend he started to have another gout flare. Patient states he he has started the Colchicine  today. Patient would like to know since he has had another gout flare should he go on the preventative medication. Please advise.

## 2023-10-23 NOTE — Progress Notes (Unsigned)
 Office Visit Note  Patient: Austin Knapp             Date of Birth: 1945-09-08           MRN: [id number]             PCP: Clemens Curt, MD Referring: Tower, Manley Seeds, MD Visit Date: 10/24/2023 Occupation: @GUAROCC @  Subjective:  Discuss treatment options   History of Present Illness: Austin Knapp is a 78 y.o. male with history of gout and osteoarthritis.  Patient presents today to discuss treatment options for management of gout.    Uric acid--6.8 updated on 08/14/23.  AST and ALT WNL and Creatinine 0.90 and GFR 89 on 08/14/23.   Activities of Daily Living:  Patient reports morning stiffness for *** {minute/hour:19697}.   Patient {ACTIONS;DENIES/REPORTS:21021675::"Denies"} nocturnal pain.  Difficulty dressing/grooming: {ACTIONS;DENIES/REPORTS:21021675::"Denies"} Difficulty climbing stairs: {ACTIONS;DENIES/REPORTS:21021675::"Denies"} Difficulty getting out of chair: {ACTIONS;DENIES/REPORTS:21021675::"Denies"} Difficulty using hands for taps, buttons, cutlery, and/or writing: {ACTIONS;DENIES/REPORTS:21021675::"Denies"}  No Rheumatology ROS completed.   PMFS History:  Patient Active Problem List   Diagnosis Date Noted   Hearing loss 06/21/2023   Elevated antinuclear antibody (ANA) level 05/24/2023   Joint pain 05/22/2023   Gout 05/08/2023   Heel pain 05/08/2023   Routine general medical examination at a health care facility 01/10/2019   Left hand pain 10/01/2018   Left elbow pain 10/01/2018   Prostate cancer screening 09/12/2017   Family history of abdominal aortic aneurysm (AAA) 09/12/2017   Hypothyroidism 09/12/2017   OSA on CPAP 08/14/2014   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   Hypertension    Testosterone  deficiency     Past Medical History:  Diagnosis Date   Actinic keratosis    Central sleep apnea    Hypertension    Idiopathic gout    Low testosterone     Obstructive sleep apnea    Sleep apnea    sever - BiPAP. 56 Hypopneas/hr   Wears hearing aid in both  ears     Family History  Problem Relation Age of Onset   Macular degeneration Mother    Glaucoma Mother    Cancer Father    AAA (abdominal aortic aneurysm) Father    Cancer Sister    Kidney cancer Brother    Healthy Son    Healthy Daughter    Healthy Daughter    Past Surgical History:  Procedure Laterality Date   CATARACT EXTRACTION W/PHACO Left 09/27/2023   Procedure: PHACOEMULSIFICATION, CATARACT, WITH IOL INSERTION 7.88 00:46.5;  Surgeon: Annell Kidney, MD;  Location: [facility];  Service: Ophthalmology;  Laterality: Left;   CATARACT EXTRACTION W/PHACO Right 10/11/2023   Procedure: PHACOEMULSIFICATION, CATARACT, WITH IOL INSERTION 7.16 00:31.9;  Surgeon: Annell Kidney, MD;  Location: [facility];  Service: Ophthalmology;  Laterality: Right;   COLONOSCOPY WITH PROPOFOL  N/A 01/19/2015   Procedure: COLONOSCOPY WITH PROPOFOL ;  Surgeon: Cassie Click, MD;  Location: [facility] END[facility];  Service: Endoscopy;  Laterality: N/A;   HERNIA REPAIR Right 08/2012   MASTOIDECTOMY     tongue excision     04/2012   VASECTOMY     Social History   Social History Narrative   Patients desires CPR.   Has HPOA.   Immunization History  Administered Date(s) Administered   Influenza, High Dose Seasonal PF 02/12/2016, 03/03/2017, 02/21/2022, 04/19/2023   Influenza-Unspecified 03/13/2014, 02/11/2018   Pneumococcal Conjugate-13 12/17/2013   Pneumococcal Polysaccharide-23 03/25/2010, 10/09/2015   Tdap 10/09/2015, 03/18/2023   Zoster Recombinant(Shingrix) 11/08/2016, 02/06/2017   Zoster, Live 01/27/2010  Objective: Vital Signs: There were no vitals taken for this visit.   Physical Exam Vitals and nursing note reviewed.  Constitutional:      Appearance: He is well-developed.  HENT:     Head: Normocephalic and atraumatic.  Eyes:     Conjunctiva/sclera: Conjunctivae normal.     Pupils: Pupils are equal, round, and reactive to light.  Cardiovascular:     Rate and  Rhythm: Normal rate and regular rhythm.     Heart sounds: Normal heart sounds.  Pulmonary:     Effort: Pulmonary effort is normal.     Breath sounds: Normal breath sounds.  Abdominal:     General: Bowel sounds are normal.     Palpations: Abdomen is soft.  Musculoskeletal:     Cervical back: Normal range of motion and neck supple.  Skin:    General: Skin is warm and dry.     Capillary Refill: Capillary refill takes less than 2 seconds.  Neurological:     Mental Status: He is alert and oriented to person, place, and time.  Psychiatric:        Behavior: Behavior normal.      Musculoskeletal Exam: ***  CDAI Exam: CDAI Score: -- Patient Global: --; Provider Global: -- Swollen: --; Tender: -- Joint Exam 10/24/2023   No joint exam has been documented for this visit   There is currently no information documented on the homunculus. Go to the Rheumatology activity and complete the homunculus joint exam.  Investigation: No additional findings.  Imaging: No results found.  Recent Labs: Lab Results  Component Value Date   WBC 5.6 08/14/2023   HGB 16.9 08/14/2023   PLT 148 08/14/2023   NA 140 08/14/2023   K 4.4 08/14/2023   CL 103 08/14/2023   CO2 31 08/14/2023   GLUCOSE 88 08/14/2023   BUN 26 (H) 08/14/2023   CREATININE 0.90 08/14/2023   BILITOT 0.7 08/14/2023   ALKPHOS 48 05/08/2023   AST 16 08/14/2023   ALT 19 08/14/2023   PROT 6.5 08/14/2023   ALBUMIN 4.3 05/08/2023   CALCIUM 9.4 08/14/2023   GFRAA >60 03/08/2012    Speciality Comments: No specialty comments available.  Procedures:  No procedures performed Allergies: Clindamycin/lincomycin and Latex   Assessment / Plan:     Visit Diagnoses: Idiopathic chronic gout of multiple sites without tophus - colchicine  p.o. twice daily as needed for flares. uric acid: 6.8 on 08/14/2023.  Elevated antinuclear antibody (ANA) level - Low titer and not significant.  Primary osteoarthritis of both hands  Pain in both  feet - He has bilateral pes cavus and hammertoes.  X-rays showed osteoarthritis changes.  Primary hypertension  History of tongue cancer - 2013, CTX, RTX, laser surgery-follow up x 10 yeras.He goes to ENT now  Acquired hypothyroidism  Testosterone  deficiency  Other specified hearing loss of both ears  OSA on CPAP  Family history of abdominal aortic aneurysm (AAA)  Orders: No orders of the defined types were placed in this encounter.  No orders of the defined types were placed in this encounter.   Face-to-face time spent with patient was *** minutes. Greater than 50% of time was spent in counseling and coordination of care.  Follow-Up Instructions: No follow-ups on file.   Romayne Clubs, PA-C  Note - This record has been created using Dragon software.  Chart creation errors have been sought, but may not always  have been located. Such creation errors do not reflect on  the standard of  medical care.

## 2023-10-23 NOTE — Telephone Encounter (Signed)
 Patient advised yes, we do recommend starting on medications to lower uric acid level after a second flare. Please schedule an appointment to discuss treatment options. Patient scheduled for an appointment on 10/24/2023 at 8:50 am.

## 2023-10-24 ENCOUNTER — Encounter: Payer: Self-pay | Admitting: Physician Assistant

## 2023-10-24 ENCOUNTER — Ambulatory Visit: Payer: Self-pay | Admitting: Physician Assistant

## 2023-10-24 ENCOUNTER — Ambulatory Visit: Attending: Physician Assistant | Admitting: Physician Assistant

## 2023-10-24 VITALS — BP 113/70 | HR 64 | Resp 16 | Ht 69.0 in | Wt 205.0 lb

## 2023-10-24 DIAGNOSIS — M1A09X0 Idiopathic chronic gout, multiple sites, without tophus (tophi): Secondary | ICD-10-CM | POA: Diagnosis not present

## 2023-10-24 DIAGNOSIS — I10 Essential (primary) hypertension: Secondary | ICD-10-CM | POA: Diagnosis not present

## 2023-10-24 DIAGNOSIS — M79671 Pain in right foot: Secondary | ICD-10-CM

## 2023-10-24 DIAGNOSIS — Z5181 Encounter for therapeutic drug level monitoring: Secondary | ICD-10-CM | POA: Diagnosis not present

## 2023-10-24 DIAGNOSIS — E039 Hypothyroidism, unspecified: Secondary | ICD-10-CM | POA: Diagnosis not present

## 2023-10-24 DIAGNOSIS — M19041 Primary osteoarthritis, right hand: Secondary | ICD-10-CM | POA: Diagnosis not present

## 2023-10-24 DIAGNOSIS — Z8249 Family history of ischemic heart disease and other diseases of the circulatory system: Secondary | ICD-10-CM

## 2023-10-24 DIAGNOSIS — E349 Endocrine disorder, unspecified: Secondary | ICD-10-CM | POA: Diagnosis not present

## 2023-10-24 DIAGNOSIS — G4733 Obstructive sleep apnea (adult) (pediatric): Secondary | ICD-10-CM

## 2023-10-24 DIAGNOSIS — R768 Other specified abnormal immunological findings in serum: Secondary | ICD-10-CM | POA: Diagnosis not present

## 2023-10-24 DIAGNOSIS — Z85810 Personal history of malignant neoplasm of tongue: Secondary | ICD-10-CM

## 2023-10-24 DIAGNOSIS — H918X3 Other specified hearing loss, bilateral: Secondary | ICD-10-CM

## 2023-10-24 DIAGNOSIS — M79672 Pain in left foot: Secondary | ICD-10-CM

## 2023-10-24 DIAGNOSIS — M19042 Primary osteoarthritis, left hand: Secondary | ICD-10-CM

## 2023-10-24 LAB — COMPREHENSIVE METABOLIC PANEL WITH GFR
AG Ratio: 2 (calc) (ref 1.0–2.5)
ALT: 20 U/L (ref 9–46)
AST: 20 U/L (ref 10–35)
Albumin: 4.3 g/dL (ref 3.6–5.1)
Alkaline phosphatase (APISO): 55 U/L (ref 35–144)
BUN: 15 mg/dL (ref 7–25)
CO2: 31 mmol/L (ref 20–32)
Calcium: 9.5 mg/dL (ref 8.6–10.3)
Chloride: 101 mmol/L (ref 98–110)
Creat: 0.89 mg/dL (ref 0.70–1.28)
Globulin: 2.2 g/dL (ref 1.9–3.7)
Glucose, Bld: 77 mg/dL (ref 65–99)
Potassium: 4.5 mmol/L (ref 3.5–5.3)
Sodium: 140 mmol/L (ref 135–146)
Total Bilirubin: 0.7 mg/dL (ref 0.2–1.2)
Total Protein: 6.5 g/dL (ref 6.1–8.1)
eGFR: 89 mL/min/{1.73_m2} (ref >=60)

## 2023-10-24 LAB — CBC WITH DIFFERENTIAL/PLATELET
Absolute Lymphocytes: 1344 {cells}/uL (ref 850–3900)
Absolute Monocytes: 646 {cells}/uL (ref 200–950)
Basophils Absolute: 51 {cells}/uL (ref 0–200)
Basophils Relative: 0.8 %
Eosinophils Absolute: 198 {cells}/uL (ref 15–500)
Eosinophils Relative: 3.1 %
HCT: 50.8 % — ABNORMAL HIGH (ref 38.5–50.0)
Hemoglobin: 16.8 g/dL (ref 13.2–17.1)
MCH: 32.3 pg (ref 27.0–33.0)
MCHC: 33.1 g/dL (ref 32.0–36.0)
MCV: 97.7 fL (ref 80.0–100.0)
MPV: 10.2 fL (ref 7.5–12.5)
Monocytes Relative: 10.1 %
Neutro Abs: 4160 {cells}/uL (ref 1500–7800)
Neutrophils Relative %: 65 %
Platelets: 151 10*3/uL (ref 140–400)
RBC: 5.2 10*6/uL (ref 4.20–5.80)
RDW: 12.7 % (ref 11.0–15.0)
Total Lymphocyte: 21 %
WBC: 6.4 10*3/uL (ref 3.8–10.8)

## 2023-10-24 LAB — URIC ACID: Uric Acid, Serum: 6.8 mg/dL (ref 4.0–8.0)

## 2023-10-24 NOTE — Patient Instructions (Addendum)
 Continue colchicine  0.6 mg 1 tablet twice daily to alleviate current flare.  Once the flare has resolved--reduce colchicine  to 0.6 mg 1 tablet daily.   Start allopurinol 150 mg daily then return for lab work in 1 week.  If labs are stable increase allopurinol to 300 mg daily.      Standing Labs We placed an order today for your standing lab work.   Please have your standing labs drawn 1 week after starting allopurinol 150 mg daily.    Please have your labs drawn 2 weeks prior to your appointment so that the provider can discuss your lab results at your appointment, if possible.  Please note that you may see your imaging and lab results in MyChart before we have reviewed them. We will contact you once all results are reviewed. Please allow our office up to 72 hours to thoroughly review all of the results before contacting the office for clarification of your results.  WALK-IN LAB HOURS  Monday through Thursday from 8:00 am -12:30 pm and 1:00 pm-4:00 pm and Friday from 8:00 am-12:00 pm.  Patients with office visits requiring labs will be seen before walk-in labs.  You may encounter longer than normal wait times. Please allow additional time. Wait times may be shorter on  Monday and Thursday afternoons.  We do not book appointments for walk-in labs. We appreciate your patience and understanding with our staff.   Labs are drawn by [facility]. Please bring your co-pay at the time of your lab draw.  You may receive a bill from [facility] for your lab work.  Please note if you are on Hydroxychloroquine and and an order has been placed for a Hydroxychloroquine level,  you will need to have it drawn 4 hours or more after your last dose.  If you wish to have your labs drawn at another location, please call the office 24 hours in advance so we can fax the orders.  The office is located at [address], [address], [place], [place] [address]   If you have any questions regarding directions or hours of  operation,  please call [phone].   As a reminder, please drink plenty of water prior to coming for your lab work. Thanks!   Information for patients with Gout  Gout defined-Gout occurs when urate crystals accumulate in your joint causing the inflammation and intense pain of gout attack.  Urate crystals can form when you have high levels of uric acid in your blood.  Your body produces uric acid when it breaks down prurines-substances that are found naturally in your body, as well as in certain foods such as organ meats, anchioves, herring, asparagus, and mushrooms.  Normally uric acid dissolves in your blood and passes through your kidneys into your urine.  But sometimes your body either produces too much uric acid or your kidneys excrete too little uric acid.  When this happens, uric acid can build up, forming sharp needle-like urate crystals in a joint or surrounding tissue that cause pain, inflammation and swelling.    Gout is characterized by sudden, severe attacks of pain, redness and tenderness in joints, often the joint at the base of the big toe.  Gout is complex form of arthritis that can affect anyone.  Men are more likely to get gout but women become increasingly more susceptible to gout after menopause.  An acute attack of gout can wake you up in the middle of the night with the sensation that your big toe is  on fire.  The affected joint is hot, swollen and so tender that even the weight or the sheet on it may seem intolerable.  If you experience symptoms of an acute gout attack it is important to your doctor as soon as the symptoms start.  Gout that goes untreated can lead to worsening pain and joint damage.  Risk Factors:  You are more likely to develop gout if you have high levels of uric acid in your body.    Factors that increase the uric acid level in your body include:  Lifestyle factors.  Excessive alcohol use-generally more than two drinks a day for men and more than  one for women increase the risk of gout.  Medical conditions.  Certain conditions make it more likely that you will develop gout.  These include hypertension, and chronic conditions such as diabetes, high levels of fat and cholesterol in the blood, and narrowing of the arteries.  Certain medications.  The uses of Thiazide diuretics- commonly used to treat hypertension and low dose aspirin can also increase uric acid levels.  Family history of gout.  If other members of your family have had gout, you are more likely to develop the disease.  Age and sex. Gout occurs more often in men than it does in women, primarily because women tend to have lower uric acid levels than men do.  Men are more likely to develop gout earlier usually between the ages of 62-50- whereas women generally develop signs and symptoms after menopause.    Tests and diagnosis:  Tests to help diagnose gout may include:  Blood test.  Your doctor may recommend a blood test to measure the uric acid level in your blood .  Blood tests can be misleading, though.  Some people have high uric acid levels but never experience gout.  And some people have signs and symptoms of gout, but don't have unusual levels of uric acid in their blood.  Joint fluid test.  Your doctor may use a needle to draw fluid from your affected joint.  When examined under the microscope, your joint fluid may reveal urate crystals.  Treatment:  Treatment for gout usually involves medications.  What medications you and your doctor choose will be based on your current health and other medications you currently take.  Gout medications can be used to treat acute gout attacks and prevent future attacks as well as reduce your risk of complications from gout such as the development of tophi from urate crystal deposits.  Alternative medicine:   Certain foods have been studied for their potential to lower uric acid levels, including:  Coffee.  Studies have found an  association between coffee drinking (regular and decaf) and lower uric acid levels.  The evidence is not enough to encourage non-coffee drinkers to start, but it may give clues to new ways of treating gout in the future.  Vitamin C.  Supplements containing vitamin C may reduce the levels of uric acid in your blood.  However, vitamin as a treatment for gout. Don't assume that if a little vitamin C is good, than lots is better.  Megadoses of vitamin C may increase your bodies uric acid levels.  Cherries.  Cherries have been associated with lower levels of uric acid in studies, but it isn't clear if they have any effect on gout signs and symptoms.  Eating more cherries and other dar-colored fruits, such as blackberries, blueberries, purple grapes and raspberries, may be a safe way to support  your gout treatment.    Lifestyle/Diet Recommendations:  Drink 8 to 16 cups ( about 2 to 4 liters) of fluid each day, with at least half being water. Avoid alcohol Eat a moderate amount of protein, preferably from healthy sources, such as low-fat or fat-free dairy, tofu, eggs, and nut butters. Limit you daily intake of meat, fish, and poultry to 4 to 6 ounces. Avoid high fat meats and desserts. Decrease you intake of shellfish, beef, lamb, pork, eggs and cheese. Choose a good source of vitamin C daily such as citrus fruits, strawberries, broccoli,  brussel sprouts, papaya, and cantaloupe.  Choose a good source of vitamin A every other day such as yellow fruits, or dark green/yellow vegetables. Avoid drastic weigh reduction or fasting.  If weigh loss is desired lose it over a period of several months. See "dietary considerations.." chart for specific food recommendations.  Dietary Considerations for people with Gout  Food with negligible purine content (0-15 mg of purine nitrogen per 100 grams food)  May use as desired except on calorie variations  Non fat milk Cocoa Cereals (except in list II) Hard candies   Buttermilk Carbonated drinks Vegetables (except in list II) Sherbet  Coffee Fruits Sugar Honey  Tea Cottage Cheese Gelatin-jell-o Salt  Fruit juice Breads Angel food Cake   Herbs/spices Jams/Jellies [employer]    Foods that do not contain excessive purine content, but must be limited due to fat content  Cream Eggs Oil and Salad Dressing  Half and Half Peanut Butter Chocolate  Whole Milk Cakes Potato Chips  Butter Ice Cream Fried Foods  Cheese Nuts Waffles, pancakes   List II: Food with moderate purine content (50-150 mg of purine nitrogen per 100 grams of food)  Limit total amount each day to 5 oz. cooked Lean meat, other than those on list III   Poultry, other than those on list III Fish, other than those on list III   Seafood, other than those on list III  These foods may be used occasionally  Peas Lentils Bran  Spinach Oatmeal Dried Beans and Peas  Asparagus Wheat Germ Mushrooms   Additional information about meat choices  Choose fish and poultry, particularly without skin, often.  Select lean, well trimmed cuts of meat.  Avoid all fatty meats, bacon , sausage, fried meats, fried fish, or poultry, luncheon meats, cold cuts, hot dogs, meats canned or frozen in gravy, spareribs and frozen and packaged prepared meats.   List III: Foods with HIGH purine content / Foods to AVOID (150-800 mg of purine nitrogen per 100 grams of food)  Anchovies Herring Meat Broths  Liver Mackerel Meat Extracts  Kidney Scallops Meat Drippings  Sardines Wild Game Mincemeat  Sweetbreads Goose Gravy  Heart Tongue Yeast, baker's and brewers   Commercial soups made with any of the foods listed in List II or List III  In addition avoid all alcoholic beverages

## 2023-10-24 NOTE — Progress Notes (Signed)
 Discussed gout prevention: he has enough attacks to merit prophylaxis with Allopurinol. The possibility of recurrent gout while lowering the uric acid is explained; temporarily colchicine  will be needed to prevent acute gout for 3 months. Will build up to 300 mg daily allopurinol, maintain high fluid intake and low purine diet. Long term use and side effect profile discussed. The patient indicates understanding of these issues and agrees with the plan. See 3 months.   Patient will start allopurinol 150 mg once daily for one week return to clinic for lab work and will start maintenance dose of 300 mg daily.

## 2023-10-24 NOTE — Progress Notes (Signed)
 CBC WNL

## 2023-10-25 ENCOUNTER — Other Ambulatory Visit: Payer: Self-pay | Admitting: *Deleted

## 2023-10-25 DIAGNOSIS — M1A09X0 Idiopathic chronic gout, multiple sites, without tophus (tophi): Secondary | ICD-10-CM

## 2023-10-25 MED ORDER — ALLOPURINOL 300 MG PO TABS: ORAL_TABLET | ORAL | 0 refills | Status: DC

## 2023-10-25 MED ORDER — COLCHICINE 0.6 MG PO TABS: 0.6000 mg | ORAL_TABLET | Freq: Every day | ORAL | 0 refills | Status: AC

## 2023-10-25 NOTE — Progress Notes (Signed)
 CMP WNL Uric acid-6.8.    Please refill colchicine   Please also send in allopurinol 150 mg daily x1 week then increase to 300 mg daily if labs are stable.  Please advise the patient to start allopurinol once his current flare has resolved.

## 2023-10-25 NOTE — Telephone Encounter (Signed)
 Last Fill: colchicine  08/14/2023, allopurinol -new RX  Labs: 10/25/2023 CMP WNL Uric acid-6.8.     Please refill colchicine  Please also send in allopurinol 150 mg daily x1 week then increase to 300 mg daily if labs are stable.  Please advise the patient to start allopurinol once his current flare has resolved.    Next Visit: 11/27/2023  Last Visit: 10/24/2023  DX:  Idiopathic chronic gout of multiple sites without tophus   Current Dose per office note 10/24/2023: lab result 10/24/2023 allopurinol 150 mg daily x1 week then increase to 300 mg daily if labs are stable, colchicine  to 1 tablet daily   Okay to refill Colchicine  and allopurinol?

## 2023-11-01 ENCOUNTER — Other Ambulatory Visit: Payer: Self-pay

## 2023-11-01 DIAGNOSIS — Z5181 Encounter for therapeutic drug level monitoring: Secondary | ICD-10-CM

## 2023-11-02 DIAGNOSIS — Z5181 Encounter for therapeutic drug level monitoring: Secondary | ICD-10-CM | POA: Diagnosis not present

## 2023-11-03 LAB — COMPREHENSIVE METABOLIC PANEL WITH GFR
AG Ratio: 2 (calc) (ref 1.0–2.5)
ALT: 16 U/L (ref 9–46)
AST: 19 U/L (ref 10–35)
Albumin: 4.1 g/dL (ref 3.6–5.1)
Alkaline phosphatase (APISO): 51 U/L (ref 35–144)
BUN: 19 mg/dL (ref 7–25)
CO2: 30 mmol/L (ref 20–32)
Calcium: 9.4 mg/dL (ref 8.6–10.3)
Chloride: 102 mmol/L (ref 98–110)
Creat: 0.99 mg/dL (ref 0.70–1.28)
Globulin: 2.1 g/dL (ref 1.9–3.7)
Glucose, Bld: 75 mg/dL (ref 65–99)
Potassium: 4.2 mmol/L (ref 3.5–5.3)
Sodium: 141 mmol/L (ref 135–146)
Total Bilirubin: 0.5 mg/dL (ref 0.2–1.2)
Total Protein: 6.2 g/dL (ref 6.1–8.1)
eGFR: 78 mL/min/{1.73_m2} (ref >=60)

## 2023-11-03 LAB — URIC ACID: Uric Acid, Serum: 6 mg/dL (ref 4.0–8.0)

## 2023-11-03 NOTE — Progress Notes (Signed)
 Uric acid is improving--6.0.  CMP WNL

## 2023-11-13 NOTE — Progress Notes (Signed)
 Office Visit Note  Patient: Austin Knapp             Date of Birth: 08/17/1945           MRN: [id number]             PCP: Clemens Curt, MD Referring: Tower, Manley Seeds, MD Visit Date: 11/27/2023 Occupation: @GUAROCC @  Subjective:  Medication monitoring  History of Present Illness: Austin Knapp is a 78 y.o. male with history of gout and osteoarthritis.  Patient was started on allopurinol  after his last office visit on 10/24/2023.  He is taking allopurinol  300 mg daily.  He is tolerating allopurinol  without any side effects and has not had any recent gaps in therapy.  He has not had any signs or symptoms of a gout flare.  He is not currently taking colchicine . He denies any increased joint pain or joint swelling at this time.     Activities of Daily Living:  Patient  denies any morning stiffness  Patient Denies nocturnal pain.  Difficulty dressing/grooming: Denies Difficulty climbing stairs: Denies Difficulty getting out of chair: Denies Difficulty using hands for taps, buttons, cutlery, and/or writing: Denies  Review of Systems  Constitutional:  Negative for fatigue.  HENT:  Negative for mouth sores and mouth dryness.   Eyes:  Negative for dryness.  Respiratory:  Negative for shortness of breath.   Cardiovascular:  Negative for chest pain and palpitations.  Gastrointestinal:  Negative for blood in stool, constipation and diarrhea.  Endocrine: Negative for increased urination.  Genitourinary:  Negative for involuntary urination.  Musculoskeletal:  Negative for joint pain, gait problem, joint pain, joint swelling, myalgias, muscle weakness, morning stiffness, muscle tenderness and myalgias.  Skin:  Negative for color change, hair loss and sensitivity to sunlight.  Allergic/Immunologic: Negative for susceptible to infections.  Neurological:  Negative for dizziness and headaches.  Hematological:  Negative for swollen glands.  Psychiatric/Behavioral:  Negative for depressed mood  and sleep disturbance. The patient is not nervous/anxious.     PMFS History:  Patient Active Problem List   Diagnosis Date Noted   Hearing loss 06/21/2023   Elevated antinuclear antibody (ANA) level 05/24/2023   Joint pain 05/22/2023   Gout 05/08/2023   Heel pain 05/08/2023   Routine general medical examination at a health care facility 01/10/2019   Left hand pain 10/01/2018   Left elbow pain 10/01/2018   Prostate cancer screening 09/12/2017   Family history of abdominal aortic aneurysm (AAA) 09/12/2017   Hypothyroidism 09/12/2017   OSA on CPAP 08/14/2014   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   Hypertension    Testosterone  deficiency     Past Medical History:  Diagnosis Date   Actinic keratosis    Central sleep apnea    Hypertension    Idiopathic gout    Low testosterone     Obstructive sleep apnea    Sleep apnea    sever - BiPAP. 56 Hypopneas/hr   Wears hearing aid in both ears     Family History  Problem Relation Age of Onset   Macular degeneration Mother    Glaucoma Mother    Cancer Father    AAA (abdominal aortic aneurysm) Father    Cancer Sister    Kidney cancer Brother    Healthy Son    Healthy Daughter    Healthy Daughter    Past Surgical History:  Procedure Laterality Date   CATARACT EXTRACTION W/PHACO Left 09/27/2023   Procedure: PHACOEMULSIFICATION, CATARACT, WITH IOL INSERTION  7.88 00:46.5;  Surgeon: Annell Kidney, MD;  Location: [facility];  Service: Ophthalmology;  Laterality: Left;   CATARACT EXTRACTION W/PHACO Right 10/11/2023   Procedure: PHACOEMULSIFICATION, CATARACT, WITH IOL INSERTION 7.16 00:31.9;  Surgeon: Annell Kidney, MD;  Location: [facility];  Service: Ophthalmology;  Laterality: Right;   COLONOSCOPY WITH PROPOFOL  N/A 01/19/2015   Procedure: COLONOSCOPY WITH PROPOFOL ;  Surgeon: Cassie Click, MD;  Location: [facility];  Service: Endoscopy;  Laterality: N/A;   HERNIA REPAIR Right 08/2012   MASTOIDECTOMY      tongue excision     04/2012   VASECTOMY     Social History   Social History Narrative   Patients desires CPR.   Has HPOA.   Immunization History  Administered Date(s) Administered   Influenza, High Dose Seasonal PF 02/12/2016, 03/03/2017, 02/21/2022, 04/19/2023   Influenza-Unspecified 03/13/2014, 02/11/2018   Pneumococcal Conjugate-13 12/17/2013   Pneumococcal Polysaccharide-23 03/25/2010, 10/09/2015   Tdap 10/09/2015, 03/18/2023   Zoster Recombinant(Shingrix) 11/08/2016, 02/06/2017   Zoster, Live 01/27/2010     Objective: Vital Signs: BP 119/81 (BP Location: Left Arm, Patient Position: Sitting, Cuff Size: Normal)   Pulse (!) 51   Ht 5' 9 (1.753 m)   Wt 200 lb (90.7 kg)   BMI 29.53 kg/m    Physical Exam Vitals and nursing note reviewed.  Constitutional:      Appearance: He is well-developed.  HENT:     Head: Normocephalic and atraumatic.   Eyes:     Conjunctiva/sclera: Conjunctivae normal.     Pupils: Pupils are equal, round, and reactive to light.    Cardiovascular:     Rate and Rhythm: Normal rate and regular rhythm.     Heart sounds: Normal heart sounds.  Pulmonary:     Effort: Pulmonary effort is normal.     Breath sounds: Normal breath sounds.  Abdominal:     General: Bowel sounds are normal.     Palpations: Abdomen is soft.   Musculoskeletal:     Cervical back: Normal range of motion and neck supple.   Skin:    General: Skin is warm and dry.     Capillary Refill: Capillary refill takes less than 2 seconds.   Neurological:     Mental Status: He is alert and oriented to person, place, and time.   Psychiatric:        Behavior: Behavior normal.      Musculoskeletal Exam: C-spine, thoracic spine, lumbar spine good range of motion.  No midline spinal tenderness.  No SI joint tenderness.  Shoulder joints, elbow joints, wrist joints, MCPs, PIPs, DIPs have good range of motion with no synovitis.  Thickening and prominence of both CMC joints with some  tenderness over the left CMC joint.  Mild PIP and DIP thickening consistent with osteoarthritis of both hands.  Complete fist formation bilaterally.  Hip joints have good range of motion with no groin pain.  Knee joints have good range of motion with no warmth or effusion.  Ankle joints have good range of motion with no tenderness or joint swelling.  No tenderness or synovitis over MTP joints.  CDAI Exam: CDAI Score: -- Patient Global: --; Provider Global: -- Swollen: --; Tender: -- Joint Exam 11/27/2023   No joint exam has been documented for this visit   There is currently no information documented on the homunculus. Go to the Rheumatology activity and complete the homunculus joint exam.  Investigation: No additional findings.  Imaging: No results found.  Recent Labs: Lab  Results  Component Value Date   WBC 6.4 10/24/2023   HGB 16.8 10/24/2023   PLT 151 10/24/2023   NA 141 11/02/2023   K 4.2 11/02/2023   CL 102 11/02/2023   CO2 30 11/02/2023   GLUCOSE 75 11/02/2023   BUN 19 11/02/2023   CREATININE 0.99 11/02/2023   BILITOT 0.5 11/02/2023   ALKPHOS 48 05/08/2023   AST 19 11/02/2023   ALT 16 11/02/2023   PROT 6.2 11/02/2023   ALBUMIN 4.3 05/08/2023   CALCIUM 9.4 11/02/2023   GFRAA >60 03/08/2012    Speciality Comments: No specialty comments available.  Procedures:  No procedures performed Allergies: Clindamycin/lincomycin and Latex   Assessment / Plan:     Visit Diagnoses: Idiopathic chronic gout of multiple sites without tophus - He is not exhibiting any signs or symptoms of a gout flare.  He has clinically been doing well taking allopurinol  300 mg daily.  He is tolerating allopurinol  without any side effects and has not had any recent gaps in therapy.  He was started on allopurinol  after his last office visit on 10/24/2023.  His uric acid level improved from 6.8 on 10/24/23 and uric acid was-6.0 on 11/02/2023.  CMP with GFR was within normal limits on 11/02/2023.  He has  not been taking colchicine  since he has not had any symptoms of a flare. He will remain on allopurinol  300 mg 1 tablet by mouth daily.  He was advised to notify us  if he develops signs or symptoms of a gout flare. Plan to update uric acid and CMP with GFR in 6 months.  He will follow-up in the office in 6 months or sooner if needed.  Medication monitoring encounter: Allopurinol  300 mg 1 tablet by mouth daily.  Colchicine  0.6 mg 1 tablet daily as needed during gout flares. Uric acid 6.0 on 11/02/23.  CMP updated 11/02/23.  Plan to update CMP with GFR and uric acid in 6 months.  Elevated antinuclear antibody (ANA) level - Low titer and not significant.  No clinical features of systemic lupus.  Primary osteoarthritis of both hands - X-rays of both hands were consistent with osteoarthritis on 08/14/2023.  Experiences intermittent discomfort and stiffness in the left CMC joint.  He has not yet purchased a CMC joint brace.  Pain in both feet - Bilateral pes cavus and hammertoes.  X-rays showed osteoarthritic changes.  He is not experiencing any increased discomfort in his feet at this time.  He has good range of motion of both ankle joints with no tenderness or synovitis.  No tenderness or synovitis over MTP joints.  Other medical conditions are listed as follows:  Primary hypertension: Blood pressure was 119/81 today in the office.  History of tongue cancer  Acquired hypothyroidism  Testosterone  deficiency  Other specified hearing loss of both ears  OSA on CPAP  Family history of abdominal aortic aneurysm (AAA)    Orders: No orders of the defined types were placed in this encounter.  No orders of the defined types were placed in this encounter.   Follow-Up Instructions: Return in about 6 months (around 05/28/2024) for Gout.   Romayne Clubs, PA-C  Note - This record has been created using Dragon software.  Chart creation errors have been sought, but may not always  have been  located. Such creation errors do not reflect on  the standard of medical care.

## 2023-11-27 ENCOUNTER — Encounter: Payer: Self-pay | Admitting: Physician Assistant

## 2023-11-27 ENCOUNTER — Ambulatory Visit: Attending: Physician Assistant | Admitting: Physician Assistant

## 2023-11-27 VITALS — BP 119/81 | HR 51 | Ht 69.0 in | Wt 200.0 lb

## 2023-11-27 DIAGNOSIS — Z85810 Personal history of malignant neoplasm of tongue: Secondary | ICD-10-CM

## 2023-11-27 DIAGNOSIS — M79671 Pain in right foot: Secondary | ICD-10-CM | POA: Diagnosis not present

## 2023-11-27 DIAGNOSIS — M79672 Pain in left foot: Secondary | ICD-10-CM

## 2023-11-27 DIAGNOSIS — H918X3 Other specified hearing loss, bilateral: Secondary | ICD-10-CM

## 2023-11-27 DIAGNOSIS — M19042 Primary osteoarthritis, left hand: Secondary | ICD-10-CM

## 2023-11-27 DIAGNOSIS — E039 Hypothyroidism, unspecified: Secondary | ICD-10-CM

## 2023-11-27 DIAGNOSIS — E349 Endocrine disorder, unspecified: Secondary | ICD-10-CM

## 2023-11-27 DIAGNOSIS — M1A09X0 Idiopathic chronic gout, multiple sites, without tophus (tophi): Secondary | ICD-10-CM

## 2023-11-27 DIAGNOSIS — Z5181 Encounter for therapeutic drug level monitoring: Secondary | ICD-10-CM

## 2023-11-27 DIAGNOSIS — I10 Essential (primary) hypertension: Secondary | ICD-10-CM | POA: Diagnosis not present

## 2023-11-27 DIAGNOSIS — R768 Other specified abnormal immunological findings in serum: Secondary | ICD-10-CM

## 2023-11-27 DIAGNOSIS — M19041 Primary osteoarthritis, right hand: Secondary | ICD-10-CM

## 2023-11-27 DIAGNOSIS — G4733 Obstructive sleep apnea (adult) (pediatric): Secondary | ICD-10-CM

## 2023-11-27 DIAGNOSIS — Z8249 Family history of ischemic heart disease and other diseases of the circulatory system: Secondary | ICD-10-CM

## 2024-01-16 ENCOUNTER — Ambulatory Visit: Payer: Self-pay

## 2024-01-16 ENCOUNTER — Ambulatory Visit: Admitting: Nurse Practitioner

## 2024-01-16 VITALS — BP 116/80 | HR 63 | Temp 98.3°F | Ht 69.0 in | Wt 203.8 lb

## 2024-01-16 DIAGNOSIS — R600 Localized edema: Secondary | ICD-10-CM | POA: Diagnosis not present

## 2024-01-16 DIAGNOSIS — R21 Rash and other nonspecific skin eruption: Secondary | ICD-10-CM | POA: Insufficient documentation

## 2024-01-16 LAB — COMPREHENSIVE METABOLIC PANEL WITH GFR
ALT: 17 U/L (ref 0–53)
AST: 17 U/L (ref 0–37)
Albumin: 4.3 g/dL (ref 3.5–5.2)
Alkaline Phosphatase: 50 U/L (ref 39–117)
BUN: 20 mg/dL (ref 6–23)
CO2: 30 meq/L (ref 19–32)
Calcium: 9.4 mg/dL (ref 8.4–10.5)
Chloride: 101 meq/L (ref 96–112)
Creatinine, Ser: 0.9 mg/dL (ref 0.40–1.50)
GFR: 82.56 mL/min (ref >60.00)
Glucose, Bld: 86 mg/dL (ref 70–99)
Potassium: 3.7 meq/L (ref 3.5–5.1)
Sodium: 138 meq/L (ref 135–145)
Total Bilirubin: 0.7 mg/dL (ref 0.2–1.2)
Total Protein: 6.3 g/dL (ref 6.0–8.3)

## 2024-01-16 LAB — CBC
HCT: 48 % (ref 39.0–52.0)
Hemoglobin: 16.1 g/dL (ref 13.0–17.0)
MCHC: 33.5 g/dL (ref 30.0–36.0)
MCV: 98.2 fl (ref 78.0–100.0)
Platelets: 162 K/uL (ref 150.0–400.0)
RBC: 4.89 Mil/uL (ref 4.22–5.81)
RDW: 14.7 % (ref 11.5–15.5)
WBC: 5.2 K/uL (ref 4.0–10.5)

## 2024-01-16 LAB — BRAIN NATRIURETIC PEPTIDE: Pro B Natriuretic peptide (BNP): 7 pg/mL (ref 0.0–100.0)

## 2024-01-16 MED ORDER — PREDNISONE 20 MG PO TABS: ORAL_TABLET | ORAL | 0 refills | Status: AC

## 2024-01-16 MED ORDER — HYDROCORTISONE 2.5 % EX CREA: TOPICAL_CREAM | Freq: Two times a day (BID) | CUTANEOUS | 0 refills | Status: AC

## 2024-01-16 NOTE — Assessment & Plan Note (Signed)
 Ambiguous in nature recently increased unsure if improvement with elevation.  Will check basic labs inclusive of CBC, BMP, D-dimer, BNP.  Patient will close follow-up with PCP.  If D-dimer is elevated we will plan to do bilateral ultrasound rule out possible blood clot.  Patient has recently flown but less than 2 hours each way but he is on exogenous hormones (AndroGel ).

## 2024-01-16 NOTE — Telephone Encounter (Signed)
 FYI Only or Action Required?: FYI only for provider.  Patient was last seen in primary care on 06/21/2023 by Austin Laine LABOR, MD.  [facility] Nurse Triage reporting [facility].  Symptoms began a week ago.  Interventions attempted: OTC medications: calamine.  Symptoms are: gradually worsening. Also having foot swelling and foot feels cold.  Triage Disposition: See HCP Within 4 Hours (Or PCP Triage)  Patient/caregiver understands and will follow disposition?: Yes                  Copied from CRM [id number]. Topic: Clinical - Red Word Triage >> Jan 16, 2024  9:49 AM Drema MATSU wrote: Red Word that prompted transfer to Nurse Triage: Patient this having an allergic reaction to poison ivy from last week. His feet is swollen and cold. He said that the itch is terrible from the ankle to the knee and it wont go away. He is requesting Prednisone .    ----------------------------------------------------------------------- From previous Reason for Contact - Scheduling: Patient/patient representative is calling to schedule an appointment. Refer to attachments for appointment information. Reason for Disposition  [1] Severe poison ivy, oak, or sumac reaction in the past AND [2] face or genitals involved  Answer Assessment - Initial Assessment Questions 1. APPEARANCE of RASH: What does the rash look like?      red 2. LOCATION: Where is the rash located?  (e.g., face, genitals, hands, legs)     Both legs and ankles 3. SIZE: How large is the rash?      Fairly large area 4. ONSET: When did the rash begin?      Exposed 1 week ago and started the next day 5. ITCHING: Does the rash itch? If Yes, ask: How bad is it?     yes 6. EXPOSURE:  How were you exposed to the plant (poison ivy, poison oak, sumac)  When were you exposed?  Note: Sometimes a poison ivy/oak/sumac rash does not appear for 2 to 3 weeks after exposure.      Yes 7. PAST HISTORY: Have you had a poison ivy rash  before? If Yes, ask: How bad was it?     yes 8. OTHER SYMPTOMS: Do you have any other symptoms? (e.g., fever)      Feet are swelling  esp the left foot - and cold to the touch  Protocols used: Poison Ivy - Oak - [facility]

## 2024-01-16 NOTE — Patient Instructions (Signed)
 Nice to see you today I will be in touch with the labs once I have them  Follow up with Dr. Randeen in 1 week, sooner if you need us 

## 2024-01-16 NOTE — Telephone Encounter (Signed)
 Aware Thanks for seeing him

## 2024-01-16 NOTE — Assessment & Plan Note (Signed)
 Will treat patient with prednisone  taper and hydrocortisone  2.5% external cream as needed.

## 2024-01-16 NOTE — Progress Notes (Signed)
 Acute Office Visit  Subjective:     Patient ID: Austin Knapp, male    DOB: 02/13/1946, 78 y.o.   MRN: [id number]  Chief Complaint  Patient presents with   Poison Ivy    Pt complains of itchiness and swelling on both legs that started a few days ago. Slight numbness. Pt has used calamine lotion to help.       Patient is in today for rash and lower extremity swelling with a history of HTN, OSA, hypothyroidism, low testosterone . Gout,joint pain, and elevated ANA  States that a wek ago yesterday he was at his duaghters house and he was clearing out some under bursh. Statse taht there were a lot of pine needles. States he noticed english ivy but did not realize posion ivt. States that he has had itching. States that his feet starte swelling Sunday. States that it idoes feel tight. He does have some ecema in the past.  States that he has noticed that elevation has helped. States tht it feel tihgt.   States that he does not have hx. Of  blood clot.   States that on the 12/24/2023 he flew to [place] that was approx 1.5 hours.  He is on androgel   Review of Systems  Constitutional:  Negative for chills and fever.  Respiratory:  Negative for shortness of breath.   Cardiovascular:  Positive for leg swelling. Negative for chest pain.  Neurological:  Positive for tingling (decreased sensatoin). Negative for headaches.        Objective:    BP 116/80   Pulse 63   Temp 98.3 F (36.8 C) (Oral)   Ht 5' 9 (1.753 m)   Wt 203 lb 12.8 oz (92.4 kg)   SpO2 95%   BMI 30.10 kg/m  BP Readings from Last 3 Encounters:  01/16/24 116/80  11/27/23 119/81  10/24/23 113/70   Wt Readings from Last 3 Encounters:  01/16/24 203 lb 12.8 oz (92.4 kg)  11/27/23 200 lb (90.7 kg)  10/24/23 205 lb (93 kg)   SpO2 Readings from Last 3 Encounters:  01/16/24 95%  10/11/23 97%  09/27/23 95%      Physical Exam Vitals and nursing note reviewed.  Constitutional:      Appearance: Normal appearance.   Cardiovascular:     Rate and Rhythm: Normal rate and regular rhythm.     Pulses:          Dorsalis pedis pulses are 2+ on the right side and 2+ on the left side.       Posterior tibial pulses are 2+ on the right side and 2+ on the left side.     Heart sounds: Normal heart sounds.  Pulmonary:     Effort: Pulmonary effort is normal.     Breath sounds: Normal breath sounds.  Musculoskeletal:     Right lower leg: Edema present.     Left lower leg: Edema present.  Skin:    Capillary Refill: Capillary refill takes less than 2 seconds.     Findings: Rash present.         Comments: Great papular vesicle with erythematous base  Neurological:     Mental Status: He is alert.     No results found for any visits on 01/16/24.      Assessment & Plan:   Problem List Items Addressed This Visit       Musculoskeletal and Integument   Rash - Primary   Will treat patient with prednisone  taper and  hydrocortisone  2.5% external cream as needed.      Relevant Medications   predniSONE  (DELTASONE ) 20 MG tablet   hydrocortisone  2.5 % cream     Other   Bilateral lower extremity edema   Ambiguous in nature recently increased unsure if improvement with elevation.  Will check basic labs inclusive of CBC, BMP, D-dimer, BNP.  Patient will close follow-up with PCP.  If D-dimer is elevated we will plan to do bilateral ultrasound rule out possible blood clot.  Patient has recently flown but less than 2 hours each way but he is on exogenous hormones (AndroGel ).      Relevant Orders   CBC   Comprehensive metabolic panel with GFR   Brain natriuretic peptide   D-dimer, quantitative    Meds ordered this encounter  Medications   predniSONE  (DELTASONE ) 20 MG tablet    Sig: Take 2 tablets (40 mg total) by mouth daily with breakfast for 3 days, THEN 1 tablet (20 mg total) daily with breakfast for 3 days. Avoid NSAIDs.    Dispense:  9 tablet    Refill:  0    Supervising Provider:   RANDEEN HARDY A [[id number]]    hydrocortisone  2.5 % cream    Sig: Apply topically 2 (two) times daily.    Dispense:  30 g    Refill:  0    Supervising Provider:   RANDEEN HARDY A [[id number]]    Return in about 1 week (around 01/23/2024) for Rash/edema wth Dr. RANDEEN .  Adina Crandall, NP

## 2024-01-16 NOTE — Telephone Encounter (Signed)
 Pt had appt with Adina, NP today for sxs  FYI to PCP

## 2024-01-17 ENCOUNTER — Other Ambulatory Visit: Payer: Self-pay | Admitting: Physician Assistant

## 2024-01-17 LAB — D-DIMER, QUANTITATIVE: D-Dimer, Quant: 0.49 ug{FEU}/mL (ref <0.50)

## 2024-01-17 NOTE — Telephone Encounter (Signed)
 Last Fill: 10/25/2023  Labs: 01/16/2024 CBC and CMP WNL   Next Visit: 04/17/2024  Last Visit: 11/27/2023  DX: Idiopathic chronic gout of multiple sites without tophus   Current Dose per office note 11/27/2023: allopurinol  300 mg 1 tablet by mouth daily   Okay to refill Allopurinol ?   Contacted patient to confirm that he is taking one 300mg  dose daily.

## 2024-01-18 ENCOUNTER — Ambulatory Visit: Payer: Self-pay | Admitting: Nurse Practitioner

## 2024-01-23 ENCOUNTER — Ambulatory Visit (INDEPENDENT_AMBULATORY_CARE_PROVIDER_SITE_OTHER): Admitting: Family Medicine

## 2024-01-23 ENCOUNTER — Encounter: Payer: Self-pay | Admitting: Family Medicine

## 2024-01-23 VITALS — BP 130/80 | HR 74 | Temp 98.3°F | Ht 69.0 in | Wt 202.1 lb

## 2024-01-23 DIAGNOSIS — I10 Essential (primary) hypertension: Secondary | ICD-10-CM

## 2024-01-23 DIAGNOSIS — R600 Localized edema: Secondary | ICD-10-CM | POA: Diagnosis not present

## 2024-01-23 DIAGNOSIS — L989 Disorder of the skin and subcutaneous tissue, unspecified: Secondary | ICD-10-CM | POA: Diagnosis not present

## 2024-01-23 DIAGNOSIS — R21 Rash and other nonspecific skin eruption: Secondary | ICD-10-CM

## 2024-01-23 MED ORDER — HYDROXYZINE PAMOATE 25 MG PO CAPS: 25.0000 mg | ORAL_CAPSULE | Freq: Three times a day (TID) | ORAL | 0 refills | Status: AC | PRN

## 2024-01-23 NOTE — Assessment & Plan Note (Signed)
 Reassuring work up and labs from [employer] Reviewed with pt  This is improving as the plant dermatitis slowly improves   Will continue to elevate legs Encouraged to stay cool  Update if not starting to improve in a week or if worsening  Call back and Er precautions noted in detail today

## 2024-01-23 NOTE — Patient Instructions (Addendum)
 You can try the hydroxyzine  for itching  It will sedate  If it causes urinary hesitancy- stop it and let us  know      Stay cool Ice and cold water may help   Watch for extension of redness or swelling/ warmth or fever (signs of antibiotic)   Elevate legs when you can    Make sure all clothing and tools are clean of the plant oil   Update if not starting to improve in a week or if worsening

## 2024-01-23 NOTE — Progress Notes (Signed)
 Subjective:    Patient ID: Austin Knapp, male    DOB: February 22, 1946, 78 y.o.   MRN: [id number]  HPI  Wt Readings from Last 3 Encounters:  01/23/24 202 lb 2 oz (91.7 kg)  01/16/24 203 lb 12.8 oz (92.4 kg)  11/27/23 200 lb (90.7 kg)   29.85 kg/m  Vitals:   01/23/24 1024  BP: 130/80  Pulse: 74  Temp: 98.3 F (36.8 C)  SpO2: 96%   Pt presents for follow up of  Rash - presumed poison ivy  Edema  Also leg lesion-worried about skin cancer   Saw NP Cable on 8/5 Rash/itching and swelling of bilateral LEs Treated with prednisone  taper and 2.5% hydrocort  cream   Taking zyrtec  Prednisone  kept him up at night   Several placed wept clear fluid  Tried cold compress last night  for itch   No signs of bacterial infection  No fever No shortness of breath   Has a vein that popped up and feels hard on the back of left lower leg   Swelling is just a little better  Tries to keep feet elevated    Meloxicam is on medicine list (from [place] for back pain occasionally)  Holding that   Tried benadryl 50 mg   Labs for edema Results for orders placed or performed in visit on 01/16/24  CBC   Collection Time: 01/16/24 11:31 AM  Result Value Ref Range   WBC 5.2 4.0 - 10.5 K/uL   RBC 4.89 4.22 - 5.81 Mil/uL   Platelets 162.0 150.0 - 400.0 K/uL   Hemoglobin 16.1 13.0 - 17.0 g/dL   HCT 51.9 60.9 - 47.9 %   MCV 98.2 78.0 - 100.0 fl   MCHC 33.5 30.0 - 36.0 g/dL   RDW 85.2 88.4 - 84.4 %  Comprehensive metabolic panel with GFR   Collection Time: 01/16/24 11:31 AM  Result Value Ref Range   Sodium 138 135 - 145 mEq/L   Potassium 3.7 3.5 - 5.1 mEq/L   Chloride 101 96 - 112 mEq/L   CO2 30 19 - 32 mEq/L   Glucose, Bld 86 70 - 99 mg/dL   BUN 20 6 - 23 mg/dL   Creatinine, Ser 9.09 0.40 - 1.50 mg/dL   Total Bilirubin 0.7 0.2 - 1.2 mg/dL   Alkaline Phosphatase 50 39 - 117 U/L   AST 17 0 - 37 U/L   ALT 17 0 - 53 U/L   Total Protein 6.3 6.0 - 8.3 g/dL   Albumin 4.3 3.5 - 5.2 g/dL   GFR  17.43 >39.99 mL/min   Calcium 9.4 8.4 - 10.5 mg/dL  Brain natriuretic peptide   Collection Time: 01/16/24 11:31 AM  Result Value Ref Range   Pro B Natriuretic peptide (BNP) 7.0 0.0 - 100.0 pg/mL  D-dimer, quantitative   Collection Time: 01/16/24 11:31 AM  Result Value Ref Range   D-Dimer, Quant 0.49 <0.50 mcg/mL FEU     HTN bp is stable today  No cp or palpitations or headaches or edema  No side effects to medicines  BP Readings from Last 3 Encounters:  01/23/24 130/80  01/16/24 116/80  11/27/23 119/81      Lab Results  Component Value Date   NA 138 01/16/2024   K 3.7 01/16/2024   CO2 30 01/16/2024   GLUCOSE 86 01/16/2024   BUN 20 01/16/2024   CREATININE 0.90 01/16/2024   CALCIUM 9.4 01/16/2024   GFR 82.56 01/16/2024   EGFR 78 11/02/2023  GFRNONAA >60 03/08/2012   Takes doxazosin 8 mg daily from [place] for voiding as well  Hydrochlorothiazide  25 mg daily   Hypothyroidism  Pt has no clinical changes No change in energy level/ hair or skin/ edema and no tremor Treated at [facility]  Levothyroxine 25 mcg daily     Patient Active Problem List   Diagnosis Date Noted   Rash 01/16/2024   Bilateral lower extremity edema 01/16/2024   Hearing loss 06/21/2023   Elevated antinuclear antibody (ANA) level 05/24/2023   Joint pain 05/22/2023   Gout 05/08/2023   Heel pain 05/08/2023   Routine general medical examination at a health care facility 01/10/2019   Left hand pain 10/01/2018   Left elbow pain 10/01/2018   Prostate cancer screening 09/12/2017   Family history of abdominal aortic aneurysm (AAA) 09/12/2017   Hypothyroidism 09/12/2017   OSA on CPAP 08/14/2014   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   Hypertension    Testosterone  deficiency    Past Medical History:  Diagnosis Date   Actinic keratosis    Central sleep apnea    Hypertension    Idiopathic gout    Low testosterone     Obstructive sleep apnea    Sleep apnea    sever - BiPAP. 56 Hypopneas/hr   Wears  hearing aid in both ears    Past Surgical History:  Procedure Laterality Date   CATARACT EXTRACTION W/PHACO Left 09/27/2023   Procedure: PHACOEMULSIFICATION, CATARACT, WITH IOL INSERTION 7.88 00:46.5;  Surgeon: Mittie Gaskin, MD;  Location[facility];  Service: Ophthalmology;  Laterality: Left;   CATARACT EXTRACTION W/PHACO Right 10/11/2023   Procedure: PHACOEMULSIFICATION, CATARACT, WITH IOL INSERTION 7.16 00:31.9;  Surgeon: Mittie Gaskin, MD;  Location: [facility];  Service: Ophthalmology;  Laterality: Right;   COLONOSCOPY WITH PROPOFOL  N/A 01/19/2015   Procedure: COLONOSCOPY WITH PROPOFOL ;  Surgeon: Lamar ONEIDA Holmes, MD;  Location: [facility];  Service: Endoscopy;  Laterality: N/A;   HERNIA REPAIR Right 08/2012   MASTOIDECTOMY     tongue excision     04/2012   VASECTOMY     Social History   Tobacco Use   Smoking status: Former    Current packs/day: 0.00    Types: Cigarettes    Quit date: 1990    Years since quitting: 35.6    Passive exposure: Never   Smokeless tobacco: Never   Tobacco comments:    Quit over 35 years ago. Smoked off and on for for approx 18 yrs  Vaping Use   Vaping status: Never Used  Substance Use Topics   Alcohol use: Yes    Comment: rare   Drug use: No   Family History  Problem Relation Age of Onset   Macular degeneration Mother    Glaucoma Mother    Cancer Father    AAA (abdominal aortic aneurysm) Father    Cancer Sister    Kidney cancer Brother    Healthy Son    Healthy Daughter    Healthy Daughter    Allergies  Allergen Reactions   Clindamycin/Lincomycin Rash   Latex Rash    Tape/Bandages if left on too long   Current Outpatient Medications on File Prior to Visit  Medication Sig Dispense Refill   allopurinol  (ZYLOPRIM ) 300 MG tablet ONE TABLET BY MOUTH ONE TIME DAILY 90 tablet 0   ANDROGEL  PUMP 20.25 MG/ACT (1.62%) GEL APPLY 4 PUMPS ONCE DAILY AS DIRECTED. 450 g 0   aspirin 81 MG tablet Take 81 mg by  mouth daily.  cetirizine (ZYRTEC) 10 MG tablet Take 10 mg by mouth daily as needed for allergies.     colchicine  0.6 MG tablet Take 1 tablet (0.6 mg total) by mouth daily. (Patient taking differently: Take 0.6 mg by mouth daily as needed.) 90 tablet 0   Cyanocobalamin (VITAMIN B-12 PO) Take by mouth daily.     doxazosin (CARDURA) 8 MG tablet Take 8 mg by mouth daily.     fluticasone  (FLONASE ) 50 MCG/ACT nasal spray instill 1 spray into each nostril once daily 16 g 1   hydrochlorothiazide  (HYDRODIURIL ) 25 MG tablet Take 25 mg by mouth daily.     hydrocortisone  2.5 % cream Apply topically 2 (two) times daily. (Patient taking differently: Apply 1 Application topically daily as needed.) 30 g 0   levothyroxine (SYNTHROID) 25 MCG tablet Take 25 mcg by mouth daily before breakfast.     Melatonin 5 MG/15ML LIQD Take 15 mg by mouth at bedtime as needed.     meloxicam (MOBIC) 15 MG tablet Take 15 mg by mouth as needed for pain.     Multiple Vitamin (MULTIVITAMIN) tablet Take 1 tablet by mouth daily.     tretinoin  (RETIN-A ) 0.025 % cream Apply pea sized amount to face nightly as tolerated 45 g 11   Turmeric Curcumin 500 MG CAPS Take by mouth daily.     vitamin E 180 MG (400 UNITS) capsule Take 400 Units by mouth daily.     ZINC-VITAMIN C PO Take 2 capsules by mouth daily. With vitamin D, Quercetin     [DISCONTINUED] lisinopril-hydrochlorothiazide  (PRINZIDE,ZESTORETIC) 10-12.5 MG per tablet Take 1 tablet by mouth daily.     No current facility-administered medications on file prior to visit.    Review of Systems  Constitutional:  Negative for chills and fever.  Eyes:  Negative for itching.  Cardiovascular:  Positive for leg swelling.  Gastrointestinal:  Negative for nausea and vomiting.  Skin:  Positive for rash.  Psychiatric/Behavioral:  Positive for sleep disturbance.        Cannot sleep with prednisone         Objective:   Physical Exam Constitutional:      General: He is not in acute  distress.    Appearance: Normal appearance. He is well-developed and normal weight. He is not ill-appearing or diaphoretic.  HENT:     Head: Normocephalic and atraumatic.  Eyes:     Conjunctiva/sclera: Conjunctivae normal.     Pupils: Pupils are equal, round, and reactive to light.  Neck:     Thyroid : No thyromegaly.     Vascular: No carotid bruit or JVD.  Cardiovascular:     Rate and Rhythm: Normal rate and regular rhythm.     Pulses: Normal pulses.     Heart sounds: Normal heart sounds.     No gallop.     Comments: Small varicosity (compressible) on posterior left calf     Pulmonary:     Effort: Pulmonary effort is normal. No respiratory distress.     Breath sounds: Normal breath sounds. No stridor. No wheezing or rales.  Abdominal:     General: There is no distension or abdominal bruit.     Palpations: Abdomen is soft.  Musculoskeletal:     Cervical back: Normal range of motion and neck supple.     Right lower leg: Edema present.     Left lower leg: Edema present.     Comments: Trace to one plus ankle edema Slightly worse on left where rash is a  bit more severe   Lymphadenopathy:     Cervical: No cervical adenopathy.  Skin:    General: Skin is warm and dry.     Coloration: Skin is not pale.     Findings: No rash.     Comments: Patchy erythematous areas on bilateral ankles and lower legs  Some in linear distribution  No intact vesicles or drainage today (vesicles are drying and becoming darker in color)  Some scabbing No excoriation   Leg swelling is improved per pt - worse on left than right ankle  Tan raised lesion resembling SK on right lateral/posterior leg  3-4 mm in diameter  Homogenous color   Neurological:     Mental Status: He is alert.     Coordination: Coordination normal.     Deep Tendon Reflexes: Reflexes are normal and symmetric. Reflexes normal.  Psychiatric:        Mood and Affect: Mood normal.           Assessment & Plan:   Problem  List Items Addressed This Visit       Cardiovascular and Mediastinum   Hypertension   bp in fair control at this time without any medication  BP Readings from Last 1 Encounters:  01/23/24 130/80   No changes needed right now  Takes doxazosin 8 mg daily from [place] for urology /voiding  Hydrochlorothiazide  25 mg daily Most recent labs reviewed  Disc lifstyle change with low sodium diet and exercise          Musculoskeletal and Integument   Rash - Primary   Consistent with plant dermatitis on lower legs  Per pt no improvement in symptoms with prednisone   Itching is severe at times  Swelling starting to improve  Areas appear to be drying up (were weeping prior) No signs and symptoms of bacterial infection- will watch for   Will try change from benadryl to hydroxyzine  (watching for urinary retention which can be side effect of either) Caution of sedation with hydroxyzine  Continue to elevate legs  Use cold compress/cold rinse if needed   Update if not starting to improve in a week or if worsening  Call back and Er precautions noted in detail today          Other   Bilateral lower extremity edema   Reassuring work up and labs from NP [employer] Reviewed with pt  This is improving as the plant dermatitis slowly improves   Will continue to elevate legs Encouraged to stay cool  Update if not starting to improve in a week or if worsening  Call back and Er precautions noted in detail today

## 2024-01-23 NOTE — Assessment & Plan Note (Signed)
 bp in fair control at this time without any medication  BP Readings from Last 1 Encounters:  01/23/24 130/80   No changes needed right now  Takes doxazosin 8 mg daily from [place] for urology /voiding  Hydrochlorothiazide  25 mg daily Most recent labs reviewed  Disc lifstyle change with low sodium diet and exercise

## 2024-01-23 NOTE — Assessment & Plan Note (Signed)
 Right lateral leg  Tan/raised oval waxy area Is consistent with SK  Reassuring Encouraged pt to also have dermatologist look at it when he goes

## 2024-01-23 NOTE — Assessment & Plan Note (Signed)
 Consistent with plant dermatitis on lower legs  Per pt no improvement in symptoms with prednisone   Itching is severe at times  Swelling starting to improve  Areas appear to be drying up (were weeping prior) No signs and symptoms of bacterial infection- will watch for   Will try change from benadryl to hydroxyzine  (watching for urinary retention which can be side effect of either) Caution of sedation with hydroxyzine  Continue to elevate legs  Use cold compress/cold rinse if needed   Update if not starting to improve in a week or if worsening  Call back and Er precautions noted in detail today

## 2024-03-21 ENCOUNTER — Ambulatory Visit: Admitting: Rheumatology

## 2024-03-25 ENCOUNTER — Encounter: Payer: Self-pay | Admitting: Dermatology

## 2024-03-25 ENCOUNTER — Ambulatory Visit: Payer: Medicare PPO | Admitting: Dermatology

## 2024-03-25 DIAGNOSIS — L821 Other seborrheic keratosis: Secondary | ICD-10-CM

## 2024-03-25 DIAGNOSIS — R238 Other skin changes: Secondary | ICD-10-CM

## 2024-03-25 DIAGNOSIS — Z85828 Personal history of other malignant neoplasm of skin: Secondary | ICD-10-CM

## 2024-03-25 DIAGNOSIS — Z1283 Encounter for screening for malignant neoplasm of skin: Secondary | ICD-10-CM

## 2024-03-25 DIAGNOSIS — L570 Actinic keratosis: Secondary | ICD-10-CM

## 2024-03-25 DIAGNOSIS — D1801 Hemangioma of skin and subcutaneous tissue: Secondary | ICD-10-CM | POA: Diagnosis not present

## 2024-03-25 DIAGNOSIS — W908XXA Exposure to other nonionizing radiation, initial encounter: Secondary | ICD-10-CM

## 2024-03-25 DIAGNOSIS — L578 Other skin changes due to chronic exposure to nonionizing radiation: Secondary | ICD-10-CM

## 2024-03-25 DIAGNOSIS — D229 Melanocytic nevi, unspecified: Secondary | ICD-10-CM

## 2024-03-25 DIAGNOSIS — L814 Other melanin hyperpigmentation: Secondary | ICD-10-CM

## 2024-03-25 NOTE — Progress Notes (Signed)
 Follow-Up Visit   Subjective  Austin Knapp is a 78 y.o. male who presents for the following: Skin Cancer Screening and Full Body Skin Exam hx of Aks, check spot R lower leg ~109m, no symptoms  The patient presents for Total-Body Skin Exam (TBSE) for skin cancer screening and mole check. The patient has spots, moles and lesions to be evaluated, some may be new or changing and the patient may have concern these could be cancer.    The following portions of the chart were reviewed this encounter and updated as appropriate: medications, allergies, medical history  Review of Systems:  No other skin or systemic complaints except as noted in HPI or Assessment and Plan.  Objective  Well appearing patient in no apparent distress; mood and affect are within normal limits.  A full examination was performed including scalp, head, eyes, ears, nose, lips, neck, chest, axillae, abdomen, back, buttocks, bilateral upper extremities, bilateral lower extremities, hands, feet, fingers, toes, fingernails, and toenails. All findings within normal limits unless otherwise noted below.   Relevant physical exam findings are noted in the Assessment and Plan.  L sup forehead x 1, glabella x 1, R antehelix x 1 (3) Pink scaly macules  Assessment & Plan   SKIN CANCER SCREENING PERFORMED TODAY.  ACTINIC DAMAGE - Chronic condition, secondary to cumulative UV/sun exposure - diffuse scaly erythematous macules with underlying dyspigmentation - Recommend daily broad spectrum sunscreen SPF 30+ to sun-exposed areas, reapply every 2 hours as needed.  - Staying in the shade or wearing long sleeves, sun glasses (UVA+UVB protection) and wide brim hats (4-inch brim around the entire circumference of the hat) are also recommended for sun protection.  - Call for new or changing lesions.  LENTIGINES, SEBORRHEIC KERATOSES, HEMANGIOMAS - Benign normal skin lesions - Benign-appearing - Call for any changes - SK R lat lower  leg  MELANOCYTIC NEVI - Tan-brown and/or pink-flesh-colored symmetric macules and papules - Benign appearing on exam today - Observation - Call clinic for new or changing moles - Recommend daily use of broad spectrum spf 30+ sunscreen to sun-exposed areas.   AK (ACTINIC KERATOSIS) (3) L sup forehead x 1, glabella x 1, R antehelix x 1 (3) Actinic keratoses are precancerous spots that appear secondary to cumulative UV radiation exposure/sun exposure over time. They are chronic with expected duration over 1 year. A portion of actinic keratoses will progress to squamous cell carcinoma of the skin. It is not possible to reliably predict which spots will progress to skin cancer and so treatment is recommended to prevent development of skin cancer.  Recommend daily broad spectrum sunscreen SPF 30+ to sun-exposed areas, reapply every 2 hours as needed.  Recommend staying in the shade or wearing long sleeves, sun glasses (UVA+UVB protection) and wide brim hats (4-inch brim around the entire circumference of the hat). Call for new or changing lesions. Destruction of lesion - L sup forehead x 1, glabella x 1, R antehelix x 1 (3) Complexity: simple   Destruction method: cryotherapy   Informed consent: discussed and consent obtained   Timeout:  patient name, date of birth, surgical site, and procedure verified Lesion destroyed using liquid nitrogen: Yes   Region frozen until ice ball extended beyond lesion: Yes   Cryo cycles: 1 or 2. Outcome: patient tolerated procedure well with no complications   Post-procedure details: wound care instructions given     HISTORY OF SQUAMOUS CELL CARCINOMA  - Tongue - Continue yearly follow ups with Otolaryngology.  Goes every April   GANGLION CYST vs DERMATOFIBROMA vs BENIGN NEVUS vs TRAUMATIC INJURY vs other L great toe Exam: 5.2mm papule, no specific features  Treatment Plan: Benign-appearing.  Observation.  Call clinic for new or changing lesions.   Recommend daily use of broad spectrum spf 30+ sunscreen to sun-exposed areas.     Return in about 1 year (around 03/25/2025) for TBSE, Hx of AKs.  I, Grayce Saunas, RMA, am acting as scribe for Boneta Sharps, MD .   Documentation: I have reviewed the above documentation for accuracy and completeness, and I agree with the above.  Boneta Sharps, MD

## 2024-03-25 NOTE — Patient Instructions (Addendum)

## 2024-03-28 ENCOUNTER — Encounter: Payer: Self-pay | Admitting: Dermatology

## 2024-03-28 ENCOUNTER — Ambulatory Visit: Payer: Self-pay | Admitting: Dermatology

## 2024-03-28 DIAGNOSIS — L821 Other seborrheic keratosis: Secondary | ICD-10-CM

## 2024-03-28 DIAGNOSIS — L578 Other skin changes due to chronic exposure to nonionizing radiation: Secondary | ICD-10-CM

## 2024-03-28 DIAGNOSIS — L814 Other melanin hyperpigmentation: Secondary | ICD-10-CM

## 2024-03-28 NOTE — Progress Notes (Signed)
 Follow-Up Visit   Subjective  Austin Knapp is a 78 y.o. male who presents for the following:  Here for 2nd bbl treatment  of browns at face  The following portions of the chart were reviewed this encounter and updated as appropriate: medications, allergies, medical history  Review of Systems:  No other skin or systemic complaints except as noted in HPI or Assessment and Plan.  Objective  Well appearing patient in no apparent distress; mood and affect are within normal limits.   A focused examination was performed of the following areas: Face   Relevant exam findings are noted in the Assessment and Plan.        Spot treatment for Browns   Spot Treatment Settings for [name]    All over treatment for [facility]   All over treatment for brown settings      Assessment & Plan   SEBORRHEIC KERATOSIS - Stuck-on, waxy, tan-brown papules and/or plaques  - Benign-appearing - Discussed benign etiology and prognosis. - Observe - Call for any changes   LENTIGINES/ Actinic Damage Exam: scattered tan macules Due to sun exposure - chronic, secondary to cumulative UV radiation exposure/sun exposure over time - diffuse scaly erythematous macules with underlying dyspigmentation - Recommend daily broad spectrum sunscreen SPF 30+ to sun-exposed areas, reapply every 2 hours as needed.  - Recommend staying in the shade or wearing long sleeves, sun glasses (UVA+UVB protection) and wide brim hats (4-inch brim around the entire circumference of the hat). - Call for new or changing lesions.  Treatment Plan: BBL today for actinic damage and lentigines  Also treated some early sks at face with cryotherapy  Will plan to treat vessels at nose 1st at next scheduled treatment and then use larger crystal all over face    Topical retinoid medications like tretinoin /Retin-A , adapalene/Differin, tazarotene/Fabior, and Epiduo/Epiduo Forte can cause dryness and irritation when first started.  Only apply a pea-sized amount to the entire affected area. Avoid applying it around the eyes, edges of mouth and creases at the nose. If you experience irritation, use a good moisturizer first and/or apply the medicine less often. If you are doing well with the medicine, you can increase how often you use it until you are applying every night. Be careful with sun protection while using this medication as it can make you sensitive to the sun. This medicine should not be used by pregnant women.     Laser safety: Patient was advised in laser safety.  Patient was fitted with laser safety goggles and advised to keep eyes closed during procedure with goggles on. Staff and provider ensured that patient and their own safety goggles were also on and eyes protected during procedure. Laser room door was secured and locked from the inside. Laser room door has laser safety sign affixed to the outside of the door.   Patient given 2nd sample kit today    [place] BBL - 03/28/24 1400      Patient Details   Skin Type: I    Anesthestic Cream Applied: No    Photo Takes: Yes    Consent Signed: Yes    Improvement from Previous Treatment: Yes      Treatment Details   Date: 03/28/24    Treatment #: 2    Area: F    Filter: 1st Pass;2nd Pass      1st Pass   Location: F    Device: 515 to treat brown spots at face    BBL j/cm2:  15    PW Msec Sec: 15    Target Temp: 15    Pulses: 61    11mm: this one      2nd Pass   Location: F    Device: 515   to treat all over browns   BBL j/cm2: 12    PW Msec Sec: 10    Target Temp: 25    Pulses: 157    11mm: this one       Patient tolerated the procedure well.   Austin avoidance was stressed. The patient will call with any problems, questions or concerns prior to their next appointment.   ACTINIC DAMAGE - chronic, secondary to cumulative UV radiation exposure/sun exposure over time - diffuse scaly erythematous macules with underlying dyspigmentation -  Recommend daily broad spectrum sunscreen SPF 30+ to sun-exposed areas, reapply every 2 hours as needed.  - Recommend staying in the shade or wearing long sleeves, sun glasses (UVA+UVB protection) and wide brim hats (4-inch brim around the entire circumference of the hat). - Call for new or changing lesions.  ACTINIC SKIN DAMAGE   Related Medications tretinoin  (RETIN-A ) 0.025 % cream Apply pea sized amount to face nightly as tolerated LENTIGO   SEBORRHEIC KERATOSIS    Return if symptoms worsen or fail to improve.  IEleanor Blush, CMA, am acting as scribe for Alm Rhyme, MD.   Documentation: I have reviewed the above documentation for accuracy and completeness, and I agree with the above.  Alm Rhyme, MD

## 2024-03-28 NOTE — Patient Instructions (Addendum)
 Seborrheic Keratosis  What causes seborrheic keratoses? Seborrheic keratoses are harmless, common skin growths that first appear during adult life.  As time goes by, more growths appear.  Some people may develop a large number of them.  Seborrheic keratoses appear on both covered and uncovered body parts.  They are not caused by sunlight.  The tendency to develop seborrheic keratoses can be inherited.  They vary in color from skin-colored to gray, brown, or even black.  They can be either smooth or have a rough, warty surface.   Seborrheic keratoses are superficial and look as if they were stuck on the skin.  Under the microscope this type of keratosis looks like layers upon layers of skin.  That is why at times the top layer may seem to fall off, but the rest of the growth remains and re-grows.    Treatment Seborrheic keratoses do not need to be treated, but can easily be removed in the office.  Seborrheic keratoses often cause symptoms when they rub on clothing or jewelry.  Lesions can be in the way of shaving.  If they become inflamed, they can cause itching, soreness, or burning.  Removal of a seborrheic keratosis can be accomplished by freezing, burning, or surgery. If any spot bleeds, scabs, or grows rapidly, please return to have it checked, as these can be an indication of a skin cancer.   Cryotherapy Aftercare  Wash gently with soap and water everyday.   Apply Vaseline and Band-Aid daily until healed.     Due to recent changes in healthcare laws, you may see results of your pathology and/or laboratory studies on MyChart before the doctors have had a chance to review them. We understand that in some cases there may be results that are confusing or concerning to you. Please understand that not all results are received at the same time and often the doctors may need to interpret multiple results in order to provide you with the best plan of care or course of treatment. Therefore, we ask  that you please give us  2 business days to thoroughly review all your results before contacting the office for clarification. Should we see a critical lab result, you will be contacted sooner.   If You Need Anything After Your Visit  If you have any questions or concerns for your doctor, please call our main line at [phone] and press option 4 to reach your doctor's medical assistant. If no one answers, please leave a voicemail as directed and we will return your call as soon as possible. Messages left after 4 pm will be answered the following business day.   You may also send us  a message via MyChart. We typically respond to MyChart messages within 1-2 business days.  For prescription refills, please ask your pharmacy to contact our office. Our fax number is [phone].  If you have an urgent issue when the clinic is closed that cannot wait until the next business day, you can page your doctor at the number below.    Please note that while we do our best to be available for urgent issues outside of office hours, we are not available 24/7.   If you have an urgent issue and are unable to reach us , you may choose to seek medical care at your doctor's office, retail clinic, urgent care center, or emergency room.  If you have a medical emergency, please immediately call 911 or go to the emergency department.  Pager Numbers  - Dr. Hester:  [phone]  - Dr. Jackquline: [phone]  - Dr. Claudene: [phone]   - Dr. Raymund: [phone]  In the event of inclement weather, please call our main line at [phone] for an update on the status of any delays or closures.  Dermatology Medication Tips: Please keep the boxes that topical medications come in in order to help keep track of the instructions about where and how to use these. Pharmacies typically print the medication instructions only on the boxes and not directly on the medication tubes.   If your medication is too expensive,  please contact our office at [phone] option 4 or send us  a message through MyChart.   We are unable to tell what your co-pay for medications will be in advance as this is different depending on your insurance coverage. However, we may be able to find a substitute medication at lower cost or fill out paperwork to get insurance to cover a needed medication.   If a prior authorization is required to get your medication covered by your insurance company, please allow us  1-2 business days to complete this process.  Drug prices often vary depending on where the prescription is filled and some pharmacies may offer cheaper prices.  The website www.goodrx.com contains coupons for medications through different pharmacies. The prices here do not account for what the cost may be with help from insurance (it may be cheaper with your insurance), but the website can give you the price if you did not use any insurance.  - You can print the associated coupon and take it with your prescription to the pharmacy.  - You may also stop by our office during regular business hours and pick up a GoodRx coupon card.  - If you need your prescription sent electronically to a different pharmacy, notify our office through [facility] or by phone at [phone] option 4.     Si Usted Necesita Algo Despus de Su Visita  Tambin puede enviarnos un mensaje a travs de Clinical cytogeneticist. Por lo general respondemos a los mensajes de MyChart en el transcurso de 1 a 2 das hbiles.  Para renovar recetas, por favor pida a su farmacia que se ponga en contacto con nuestra oficina. Randi lakes de fax es [place] [phone].  Si tiene un asunto urgente cuando la clnica est cerrada y que no puede esperar hasta el siguiente da hbil, puede llamar/localizar a su doctor(a) al nmero que aparece a continuacin.   Por favor, tenga en cuenta que aunque hacemos todo lo posible para estar disponibles para asuntos urgentes fuera del  horario de [place], no estamos disponibles las 24 horas del da, los 7 [address]  [address] de la [place].   Si tiene un problema urgente y no puede comunicarse con nosotros, puede optar por buscar atencin mdica  en el consultorio de su doctor(a), en una clnica privada, en un centro de atencin urgente o en una sala de emergencias.  Si tiene Engineer, drilling, por favor llame inmediatamente al 911 o vaya a la sala de emergencias.  Nmeros de bper  - Dr. Hester: [phone]  - Dra. Jackquline: [phone]  - Dr. Claudene: [phone]  - Dra. Kitts: [phone]  En caso de inclemencias del [place], por favor llame a nuestra lnea principal al [phone] para una actualizacin sobre el estado de cualquier retraso o cierre.  Consejos para la medicacin en dermatologa: Por favor, guarde las cajas en las que vienen los medicamentos de uso tpico para ayudarle a seguir las instrucciones sobre dnde y cmo  usarlos. Las farmacias generalmente imprimen las instrucciones del medicamento slo en las cajas y no directamente en los tubos del [place].   Si su medicamento es muy caro, por favor, pngase en contacto con landry rieger llamando al [phone] y presione la opcin 4 o envenos un mensaje a travs de Clinical cytogeneticist.   No podemos decirle cul ser su copago por los medicamentos por adelantado ya que esto es diferente dependiendo de la cobertura de su seguro. Sin embargo, es posible que podamos encontrar un medicamento sustituto a Audiological scientist un formulario para que el seguro cubra el medicamento que se considera necesario.   Si se requiere una autorizacin previa para que su compaa de seguros malta su medicamento, por favor permtanos de 1 a 2 das hbiles para completar este proceso.  Los precios de los medicamentos varan con frecuencia dependiendo del Environmental consultant de dnde se surte la receta y alguna farmacias pueden ofrecer precios ms baratos.  El sitio web www.goodrx.com tiene cupones para  medicamentos de Health and safety inspector. Los precios aqu no tienen en cuenta lo que podra costar con la ayuda del seguro (puede ser ms barato con su seguro), pero el sitio web puede darle el precio si no utiliz Tourist information centre manager.  - Puede imprimir el cupn correspondiente y llevarlo con su receta a la farmacia.  - Tambin puede pasar por nuestra oficina durante el horario de atencin regular y Education officer, museum una tarjeta de cupones de GoodRx.  - Si necesita que su receta se enve electrnicamente a una farmacia diferente, informe a nuestra oficina a travs de MyChart de [place] o por telfono llamando al [phone] y presione la opcin 4.

## 2024-04-01 ENCOUNTER — Telehealth: Payer: Self-pay

## 2024-04-01 NOTE — Telephone Encounter (Signed)
 Called patient to see how he was doing after recent BBl treatment. Patient reports he is doing good, did reports some redness after treatment and day after, but he is not bothered by anything and feels everything is getting better. Has scheduled to have another procedure in December.

## 2024-04-04 NOTE — Progress Notes (Signed)
 Office Visit Note  Patient: Austin Knapp             Date of Birth: Feb 16, 1946           MRN: [id number]             PCP: Randeen Laine LABOR, MD Referring: Tower, Laine LABOR, MD Visit Date: 04/17/2024 Occupation: Data Unavailable  Subjective:  Medication management  History of Present Illness: Austin Knapp is a 78 y.o. male with gout and osteoarthritis.  He returns today after his last visit in June 2025.  He has not had a gout flare since the last visit.  He states he is continues to be on allopurinol  300 mg daily.  He did not have to take colchicine .  He notices some discomfort in his left CMC joint.  He states he had a prescription for left [facility] joint which he lost.  He would like another prescription.    Activities of Daily Living:  Patient reports morning stiffness for 30 minutes.   Patient Denies nocturnal pain.  Difficulty dressing/grooming: Denies Difficulty climbing stairs: Denies Difficulty getting out of chair: Denies Difficulty using hands for taps, buttons, cutlery, and/or writing: Denies  Review of Systems  Constitutional:  Positive for fatigue.  HENT:  Positive for mouth dryness. Negative for mouth sores.   Eyes:  Negative for dryness.  Respiratory:  Negative for shortness of breath.   Cardiovascular:  Negative for chest pain and palpitations.  Gastrointestinal:  Negative for blood in stool, constipation and diarrhea.  Endocrine: Negative for increased urination.  Genitourinary:  Negative for involuntary urination.  Musculoskeletal:  Positive for joint pain, joint pain and morning stiffness. Negative for gait problem, joint swelling, myalgias, muscle weakness, muscle tenderness and myalgias.  Skin:  Negative for color change, rash, hair loss and sensitivity to sunlight.  Allergic/Immunologic: Negative for susceptible to infections.  Neurological:  Negative for dizziness and headaches.  Hematological:  Negative for swollen glands.  Psychiatric/Behavioral:  Positive  for sleep disturbance. Negative for depressed mood. The patient is not nervous/anxious.     PMFS History:  Patient Active Problem List   Diagnosis Date Noted   Skin lesion of right leg 01/23/2024   Rash 01/16/2024   Bilateral lower extremity edema 01/16/2024   Hearing loss 06/21/2023   Elevated antinuclear antibody (ANA) level 05/24/2023   Joint pain 05/22/2023   Gout 05/08/2023   Heel pain 05/08/2023   Routine general medical examination at a health care facility 01/10/2019   Left hand pain 10/01/2018   Left elbow pain 10/01/2018   Prostate cancer screening 09/12/2017   Family history of abdominal aortic aneurysm (AAA) 09/12/2017   Hypothyroidism 09/12/2017   OSA on CPAP 08/14/2014   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   Hypertension    Testosterone  deficiency     Past Medical History:  Diagnosis Date   Actinic keratosis    Central sleep apnea    Hypertension    Idiopathic gout    Low testosterone     Obstructive sleep apnea    Sleep apnea    sever - BiPAP. 56 Hypopneas/hr   Wears hearing aid in both ears     Family History  Problem Relation Age of Onset   Macular degeneration Mother    Glaucoma Mother    Cancer Father    AAA (abdominal aortic aneurysm) Father    Cancer Sister    Kidney cancer Brother    Healthy Son    Healthy Daughter  Healthy Daughter    Past Surgical History:  Procedure Laterality Date   CATARACT EXTRACTION W/PHACO Left 09/27/2023   Procedure: PHACOEMULSIFICATION, CATARACT, WITH IOL INSERTION 7.88 00:46.5;  Surgeon: Mittie Gaskin, MD;  Location: [facility];  Service: Ophthalmology;  Laterality: Left;   CATARACT EXTRACTION W/PHACO Right 10/11/2023   Procedure: PHACOEMULSIFICATION, CATARACT, WITH IOL INSERTION 7.16 00:31.9;  Surgeon: Mittie Gaskin, MD;  Location: [facility];  Service: Ophthalmology;  Laterality: Right;   COLONOSCOPY WITH PROPOFOL  N/A 01/19/2015   Procedure: COLONOSCOPY WITH PROPOFOL ;  Surgeon:  Lamar ONEIDA Holmes, MD;  [facility];  Service: Endoscopy;  Laterality: N/A;   HERNIA REPAIR Right 08/2012   MASTOIDECTOMY     tongue excision     04/2012   VASECTOMY     Social History   Tobacco Use   Smoking status: Former    Current packs/day: 0.00    Types: Cigarettes    Quit date: 1990    Years since quitting: 35.8    Passive exposure: Never   Smokeless tobacco: Never   Tobacco comments:    Quit over 35 years ago. Smoked off and on for for approx 18 yrs  Vaping Use   Vaping status: Never Used  Substance Use Topics   Alcohol use: Yes    Comment: rare   Drug use: No   Social History   Social History Narrative   Patients desires CPR.   Has HPOA.     Immunization History  Administered Date(s) Administered   INFLUENZA, HIGH DOSE SEASONAL PF 02/12/2016, 03/03/2017, 02/21/2022, 04/19/2023   Influenza-Unspecified 03/13/2014, 02/11/2018   Pneumococcal Conjugate-13 12/17/2013   Pneumococcal Polysaccharide-23 03/25/2010, 10/09/2015   Tdap 10/09/2015, 03/18/2023   Zoster Recombinant(Shingrix) 11/08/2016, 02/06/2017   Zoster, Live 01/27/2010     Objective: Vital Signs: BP 128/79   Pulse 65   Temp 98.1 F (36.7 C)   Resp 17   Ht 5' 9 (1.753 m)   Wt 208 lb 3.2 oz (94.4 kg)   BMI 30.75 kg/m    Physical Exam Vitals and nursing note reviewed.  Constitutional:      Appearance: He is well-developed.  HENT:     Head: Normocephalic and atraumatic.  Eyes:     Conjunctiva/sclera: Conjunctivae normal.     Pupils: Pupils are equal, round, and reactive to light.  Cardiovascular:     Rate and Rhythm: Normal rate and regular rhythm.     Heart sounds: Normal heart sounds.  Pulmonary:     Effort: Pulmonary effort is normal.     Breath sounds: Normal breath sounds.  Abdominal:     General: Bowel sounds are normal.     Palpations: Abdomen is soft.  Musculoskeletal:     Cervical back: Normal range of motion and neck supple.  Skin:    General: Skin is warm  and dry.     Capillary Refill: Capillary refill takes less than 2 seconds.  Neurological:     Mental Status: He is alert and oriented to person, place, and time.  Psychiatric:        Behavior: Behavior normal.      Musculoskeletal Exam: Cervical, thoracic and lumbar spine Juengel range of motion.  He had good range of motion of his shoulders, elbows, wrist, MCPs PIPs and DIPs.  Prominence of bilateral CMC PIP and DIP joints was noted with no synovitis.  Hip joints and knee joints in good range of motion.  Bilateral lower extremity edema was noted.  There was no tenderness  over ankles and MTPs.  CDAI Exam: CDAI Score: -- Patient Global: --; Provider Global: -- Swollen: --; Tender: -- Joint Exam 04/17/2024   No joint exam has been documented for this visit   There is currently no information documented on the homunculus. Go to the Rheumatology activity and complete the homunculus joint exam.  Investigation: No additional findings.  Imaging: No results found.  Recent Labs: Lab Results  Component Value Date   WBC 5.2 01/16/2024   HGB 16.1 01/16/2024   PLT 162.0 01/16/2024   NA 138 01/16/2024   K 3.7 01/16/2024   CL 101 01/16/2024   CO2 30 01/16/2024   GLUCOSE 86 01/16/2024   BUN 20 01/16/2024   CREATININE 0.90 01/16/2024   BILITOT 0.7 01/16/2024   ALKPHOS 50 01/16/2024   AST 17 01/16/2024   ALT 17 01/16/2024   PROT 6.3 01/16/2024   ALBUMIN 4.3 01/16/2024   CALCIUM 9.4 01/16/2024   GFRAA >60 03/08/2012    Speciality Comments: No specialty comments available.  Procedures:  No procedures performed Allergies: Clindamycin/lincomycin and Latex   Assessment / Plan:     Visit Diagnoses: Idiopathic chronic gout of multiple sites without tophus - uric acid: 6.0 on 11/02/2023. -He has not had a flare of gout since the last visit.  He is on allopurinol  300 mg daily.  I will check uric acid level today.  Plan: Uric acid, Uric acid.  Prescription refill for allopurinol  was  sent.  Medication monitoring encounter - Allopurinol  300 mg 1 tablet by mouth daily.  Colchicine  0.6 mg 1 tablet daily as needed during gout flares. -August 2025 CBC and CMP were normal.  Will recheck labs in February.  Plan: CBC with Differential/Platelet, Comprehensive metabolic panel with GFR  Elevated antinuclear antibody (ANA) level - Low titer and not significant.  No clinical features of systemic lupus.  Primary osteoarthritis of both hands -he continues to have some discomfort over the [facility] joints especially the left CMC joint.  Prescription for left CMC brace was given.  PIP and DIP thickening was noted.  X-rays of both hands were consistent with osteoarthritis on 08/14/2023.  Pain in both feet - Bilateral pes cavus and hammertoes.  X-rays showed osteoarthritic changes.  Bilateral lower extremity edema was noted.  Primary hypertension-blood pressure was normal at 128/79.  Acquired hypothyroidism  History of tongue cancer  Testosterone  deficiency  Other specified hearing loss of both ears  OSA on CPAP  Family history of abdominal aortic aneurysm (AAA)  Orders: Orders Placed This Encounter  Procedures   Uric acid   CBC with Differential/Platelet   Comprehensive metabolic panel with GFR   Uric acid   Meds ordered this encounter  Medications   allopurinol  (ZYLOPRIM ) 300 MG tablet    Sig: ONE TABLET BY MOUTH ONE TIME DAILY    Dispense:  90 tablet    Refill:  1     Follow-Up Instructions: Return in about 6 months (around 10/15/2024) for Osteoarthritis, Gout.   Maya Nash, MD  Note - This record has been created using Animal nutritionist.  Chart creation errors have been sought, but may not always  have been located. Such creation errors do not reflect on  the standard of medical care.

## 2024-04-10 DIAGNOSIS — G4733 Obstructive sleep apnea (adult) (pediatric): Secondary | ICD-10-CM | POA: Diagnosis not present

## 2024-04-10 DIAGNOSIS — E039 Hypothyroidism, unspecified: Secondary | ICD-10-CM | POA: Diagnosis not present

## 2024-04-10 DIAGNOSIS — Z79899 Other long term (current) drug therapy: Secondary | ICD-10-CM | POA: Diagnosis not present

## 2024-04-10 DIAGNOSIS — N529 Male erectile dysfunction, unspecified: Secondary | ICD-10-CM | POA: Diagnosis not present

## 2024-04-10 DIAGNOSIS — Z8249 Family history of ischemic heart disease and other diseases of the circulatory system: Secondary | ICD-10-CM | POA: Diagnosis not present

## 2024-04-10 DIAGNOSIS — I129 Hypertensive chronic kidney disease with stage 1 through stage 4 chronic kidney disease, or unspecified chronic kidney disease: Secondary | ICD-10-CM | POA: Diagnosis not present

## 2024-04-10 DIAGNOSIS — M1990 Unspecified osteoarthritis, unspecified site: Secondary | ICD-10-CM | POA: Diagnosis not present

## 2024-04-10 DIAGNOSIS — K219 Gastro-esophageal reflux disease without esophagitis: Secondary | ICD-10-CM | POA: Diagnosis not present

## 2024-04-10 DIAGNOSIS — M109 Gout, unspecified: Secondary | ICD-10-CM | POA: Diagnosis not present

## 2024-04-17 ENCOUNTER — Ambulatory Visit: Attending: Rheumatology | Admitting: Rheumatology

## 2024-04-17 ENCOUNTER — Encounter: Payer: Self-pay | Admitting: Rheumatology

## 2024-04-17 VITALS — BP 128/79 | HR 65 | Temp 98.1°F | Resp 17 | Ht 69.0 in | Wt 208.2 lb

## 2024-04-17 DIAGNOSIS — I10 Essential (primary) hypertension: Secondary | ICD-10-CM

## 2024-04-17 DIAGNOSIS — Z5181 Encounter for therapeutic drug level monitoring: Secondary | ICD-10-CM

## 2024-04-17 DIAGNOSIS — E349 Endocrine disorder, unspecified: Secondary | ICD-10-CM | POA: Diagnosis not present

## 2024-04-17 DIAGNOSIS — M79671 Pain in right foot: Secondary | ICD-10-CM | POA: Diagnosis not present

## 2024-04-17 DIAGNOSIS — G4733 Obstructive sleep apnea (adult) (pediatric): Secondary | ICD-10-CM

## 2024-04-17 DIAGNOSIS — R7689 Other specified abnormal immunological findings in serum: Secondary | ICD-10-CM | POA: Diagnosis not present

## 2024-04-17 DIAGNOSIS — M19041 Primary osteoarthritis, right hand: Secondary | ICD-10-CM

## 2024-04-17 DIAGNOSIS — M1A09X0 Idiopathic chronic gout, multiple sites, without tophus (tophi): Secondary | ICD-10-CM | POA: Diagnosis not present

## 2024-04-17 DIAGNOSIS — H918X3 Other specified hearing loss, bilateral: Secondary | ICD-10-CM

## 2024-04-17 DIAGNOSIS — M19042 Primary osteoarthritis, left hand: Secondary | ICD-10-CM

## 2024-04-17 DIAGNOSIS — M79672 Pain in left foot: Secondary | ICD-10-CM

## 2024-04-17 DIAGNOSIS — E039 Hypothyroidism, unspecified: Secondary | ICD-10-CM | POA: Diagnosis not present

## 2024-04-17 DIAGNOSIS — Z85810 Personal history of malignant neoplasm of tongue: Secondary | ICD-10-CM

## 2024-04-17 DIAGNOSIS — Z8249 Family history of ischemic heart disease and other diseases of the circulatory system: Secondary | ICD-10-CM

## 2024-04-17 MED ORDER — ALLOPURINOL 300 MG PO TABS: ORAL_TABLET | ORAL | 1 refills | Status: AC

## 2024-04-17 NOTE — Patient Instructions (Signed)
 Standing Labs We placed an order today for your standing lab work.   Please have your standing labs drawn in February  Please have your labs drawn 2 weeks prior to your appointment so that the provider can discuss your lab results at your appointment, if possible.  Please note that you may see your imaging and lab results in MyChart before we have reviewed them. We will contact you once all results are reviewed. Please allow our office up to 72 hours to thoroughly review all of the results before contacting the office for clarification of your results.  WALK-IN LAB HOURS  Monday through Thursday from 8:00 am -12:30 pm and 1:00 pm-4:30 pm and Friday from 8:00 am-12:00 pm.  Patients with office visits requiring labs will be seen before walk-in labs.  You may encounter longer than normal wait times. Please allow additional time. Wait times may be shorter on  Monday and Thursday afternoons.  We do not book appointments for walk-in labs. We appreciate your patience and understanding with our staff.   Labs are drawn by [facility]. Please bring your co-pay at the time of your lab draw.  You may receive a bill from [facility] for your lab work.  Please note if you are on Hydroxychloroquine and and an order has been placed for a Hydroxychloroquine level,  you will need to have it drawn 4 hours or more after your last dose.  If you wish to have your labs drawn at another location, please call the office 24 hours in advance so we can fax the orders.  The office is located at [address], [place], [place] [address]   If you have any questions regarding directions or hours of operation,  please call [phone].   As a reminder, please drink plenty of water prior to coming for your lab work. Thanks!

## 2024-04-18 ENCOUNTER — Ambulatory Visit: Payer: Self-pay | Admitting: Rheumatology

## 2024-04-18 LAB — URIC ACID: Uric Acid, Serum: 4.7 mg/dL (ref 4.0–8.0)

## 2024-04-18 NOTE — Progress Notes (Signed)
Uric acid is in the desirable range.

## 2024-05-02 DIAGNOSIS — H353131 Nonexudative age-related macular degeneration, bilateral, early dry stage: Secondary | ICD-10-CM | POA: Diagnosis not present

## 2024-05-02 DIAGNOSIS — H04123 Dry eye syndrome of bilateral lacrimal glands: Secondary | ICD-10-CM | POA: Diagnosis not present

## 2024-05-02 DIAGNOSIS — H43813 Vitreous degeneration, bilateral: Secondary | ICD-10-CM | POA: Diagnosis not present

## 2024-05-02 DIAGNOSIS — Z961 Presence of intraocular lens: Secondary | ICD-10-CM | POA: Diagnosis not present

## 2024-05-22 ENCOUNTER — Ambulatory Visit: Admitting: Dermatology

## 2024-05-22 ENCOUNTER — Encounter: Payer: Self-pay | Admitting: Dermatology

## 2024-05-22 DIAGNOSIS — W908XXA Exposure to other nonionizing radiation, initial encounter: Secondary | ICD-10-CM | POA: Diagnosis not present

## 2024-05-22 DIAGNOSIS — L988 Other specified disorders of the skin and subcutaneous tissue: Secondary | ICD-10-CM

## 2024-05-22 DIAGNOSIS — D489 Neoplasm of uncertain behavior, unspecified: Secondary | ICD-10-CM

## 2024-05-22 DIAGNOSIS — L821 Other seborrheic keratosis: Secondary | ICD-10-CM | POA: Diagnosis not present

## 2024-05-22 DIAGNOSIS — I781 Nevus, non-neoplastic: Secondary | ICD-10-CM

## 2024-05-22 DIAGNOSIS — L814 Other melanin hyperpigmentation: Secondary | ICD-10-CM

## 2024-05-22 DIAGNOSIS — L820 Inflamed seborrheic keratosis: Secondary | ICD-10-CM

## 2024-05-22 DIAGNOSIS — L578 Other skin changes due to chronic exposure to nonionizing radiation: Secondary | ICD-10-CM

## 2024-05-22 NOTE — Progress Notes (Signed)
 Follow-Up Visit   Subjective  Austin Knapp is a 78 y.o. male who presents for the following: 8 week bbl  Here to have vessels at nose treated and brown spots on face treated  The patient has spots, moles and lesions to be evaluated, some may be new or changing and the patient may have concern these could be cancer.  Patient reports a spot on right glabella that has been treated in past but will not go away. Also some itchy spots on back of left shoulder.   The following portions of the chart were reviewed this encounter and updated as appropriate: medications, allergies, medical history  Review of Systems:  No other skin or systemic complaints except as noted in HPI or Assessment and Plan.  Objective  Well appearing patient in no apparent distress; mood and affect are within normal limits.   A focused examination was performed of the following areas: Face, nose, left posterior shoulder, right glabella   Relevant exam findings are noted in the Assessment and Plan.  BBL PHOTOS BEFORE TREATMENT        TELANGIECTASIAS AT NOSE   TELANGIECTASIAS AT NOSE   TELANGIECTASIAS AT NOSE     ALL OVER BROWN SPOTS    left posterior shoulder x 4 (4) Erythematous stuck-on, waxy papule or plaque right glabella 0.6 cm flesh colored papule    Assessment & Plan   ACTINIC DAMAGE - chronic, secondary to cumulative UV radiation exposure/sun exposure over time - diffuse scaly erythematous macules with underlying dyspigmentation - Recommend daily broad spectrum sunscreen SPF 30+ to sun-exposed areas, reapply every 2 hours as needed.  - Recommend staying in the shade or wearing long sleeves, sun glasses (UVA+UVB protection) and wide brim hats (4-inch brim around the entire circumference of the hat). - Call for new or changing lesions.  SEBORRHEIC KERATOSIS - Stuck-on, waxy, tan-brown papules and/or plaques  - Benign-appearing - Discussed benign etiology and prognosis. 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  - Recommend staying in the shade or wearing long sleeves, sun glasses (UVA+UVB protection) and wide brim hats (4-inch brim around the entire circumference of the hat). - Call for new or changing lesions.   Treatment Plan:  Laser safety: Patient was advised in laser safety.  Patient was fitted with laser safety goggles and advised to keep eyes closed during procedure with goggles on. Staff and provider ensured that patient and their own safety goggles were also on and eyes protected during procedure. Laser room door was secured and locked from the inside. Laser room door has laser safety sign affixed to the outside of the door.      [place] BBL - 05/22/24 1500      Patient Details   Skin Type: I    Anesthestic Cream Applied: No    Photo Takes: Yes    Consent Signed: Yes    Improvement from Previous Treatment: Yes      Treatment Details   Date: 05/22/24    Treatment #: 3    Area: f    Filter: 1st Pass;2nd Pass;3rd Pass      1st Pass   Location: F   to treat telangectasias at nose   Device:  560   treat telangectasias at nose   BBL j/cm2: 25    PW Msec Sec: 26    Target Temp: 20    Pulses: 41    11mm: this one      2nd Pass   Location: F    Device: 515   telangectasias at nose   BBL j/cm2: 15    PW Msec Sec: 15    Target Temp: 15    Pulses: 70    11mm: this one      Patient tolerated the procedure well.   Austin avoidance was stressed. The patient will call with any problems, questions or concerns prior to their next appointment.   LENTIGINES/ Actinic Damage Exam: scattered tan macules at forehead, cheeks, chin, upper  lip, and temples   Due to sun exposure - chronic, secondary to cumulative UV radiation exposure/sun exposure over time - diffuse scaly erythematous macules with underlying dyspigmentation - Recommend daily broad spectrum sunscreen SPF 30+ to sun-exposed areas, reapply every 2 hours as needed.  - Recommend staying in the shade or wearing long sleeves, sun glasses (UVA+UVB protection) and wide brim hats (4-inch brim around the entire circumference of the hat). - Call for new or changing lesions.  Treatment Plan: Laser safety: Patient was advised in laser safety.  Patient was fitted with laser safety goggles and advised to keep eyes closed during procedure with goggles on. Staff and provider ensured that patient and their own safety goggles were also on and eyes protected during procedure. Laser room door was secured and locked from the inside. Laser room door has laser safety sign affixed to the outside of the door.    Patient given 3rd sample kit today   [place] BBL - 05/22/24 1500      Patient Details   Skin Type: I    Anesthestic Cream Applied: No    Photo Takes: Yes    Consent Signed: Yes    Improvement from Previous Treatment: Yes      Treatment Details   Date: 05/22/24    Treatment #: 3    Area: f    Filter: 1st Pass;2nd Pass;3rd Pass       3rd Pass   Location: F    Device: 515  to treat browns all over face  BBL j/cm2: 12    PW Msec Sec: 10    Target Temp: 25    Pulses: 139    15x15: this one    Patient tolerated the procedure well.   Austin avoidance was stressed. The patient will call with any problems, questions or concerns prior to their next appointment.  FACIAL ELASTOSIS Exam: Rhytides and volume loss. Wrinkles on face   Treatment Plan: Discussed Tretinoin  0.025 % cream will not help with wrinkling at crows feet, deep lines at forehead and deep lines at mid face  Discussed other more aggressive procedures would be beneficial such as Botox, Fillers, deep  chemical peels, Co2 laser, and dermabrasion.  Discussed otc Red Light Therapy Mask are beneficial for helping with acne and some texture   Discussed LHR for ear hair - do not recommend - recommend plucking or shaving   Discussed otc collagen supplements - ok to take  Recommend daily broad spectrum sunscreen SPF 30+ to sun-exposed areas, reapply every 2 hours as needed. Call for new or changing lesions.  Staying in the shade or wearing long sleeves, sun glasses (UVA+UVB protection) and wide brim hats (4-inch brim around the entire circumference of the  hat) are also recommended for sun protection.    INFLAMED SEBORRHEIC KERATOSIS (4) left posterior shoulder x 4 (4) Symptomatic, irritating, patient would like treated. Destruction of lesion - left posterior shoulder x 4 (4) Complexity: simple   Destruction method: cryotherapy   Informed consent: discussed and consent obtained   Timeout:  patient name, date of birth, surgical site, and procedure verified Lesion destroyed using liquid nitrogen: Yes   Region frozen until ice ball extended beyond lesion: Yes   Outcome: patient tolerated procedure well with no complications   Post-procedure details: wound care instructions given    NEOPLASM OF UNCERTAIN BEHAVIOR right glabella Epidermal / dermal shaving  Lesion diameter (cm):  0.6 Informed consent: discussed and consent obtained   Timeout: patient name, date of birth, surgical site, and procedure verified   Procedure prep:  Patient was prepped and draped in usual sterile fashion Prep type:  Isopropyl alcohol Anesthesia: the lesion was anesthetized in a standard fashion   Anesthetic:  1% lidocaine  w/ epinephrine  1-100,000 buffered w/ 8.4% NaHCO3 Instrument used: flexible razor blade   Hemostasis achieved with: pressure, aluminum chloride and electrodesiccation   Outcome: patient tolerated procedure well   Post-procedure details: sterile dressing applied and wound care instructions given    Dressing type: bandage and petrolatum    Specimen 1 - Surgical pathology Differential Diagnosis: R/o nevus vs sebaceous hyperplasia vs other  Small specimen in bottle Check Margins: yes R/o nevus vs sebaceous hyperplasia vs other   Return in about 3 months (around 08/20/2024) for BBL .  IEleanor Blush, CMA, am acting as scribe for Alm Rhyme, MD.   Documentation: I have reviewed the above documentation for accuracy and completeness, and I agree with the above.  Alm Rhyme, MD

## 2024-05-22 NOTE — Patient Instructions (Addendum)
 Biopsy Wound Care Instructions  Leave the original bandage on for 24 hours if possible.  If the bandage becomes soaked or soiled before that time, it is OK to remove it and examine the wound.  A small amount of post-operative bleeding is normal.  If excessive bleeding occurs, remove the bandage, place gauze over the site and apply continuous pressure (no peeking) over the area for 30 minutes. If this does not work, please call our clinic as soon as possible or page your doctor if it is after hours.   Once a day, cleanse the wound with soap and water. It is fine to shower. If a thick crust develops you may use a Q-tip dipped into dilute hydrogen peroxide (mix 1:1 with water) to dissolve it.  Hydrogen peroxide can slow the healing process, so use it only as needed.    After washing, apply petroleum jelly (Vaseline) or an antibiotic ointment if your doctor prescribed one for you, followed by a bandage.    For best healing, the wound should be covered with a layer of ointment at all times. If you are not able to keep the area covered with a bandage to hold the ointment in place, this may mean re-applying the ointment several times a day.  Continue this wound care until the wound has healed and is no longer open.   Itching and mild discomfort is normal during the healing process. However, if you develop pain or severe itching, please call our office.   If you have any discomfort, you can take Tylenol  (acetaminophen ) or ibuprofen as directed on the bottle. (Please do not take these if you have an allergy to them or cannot take them for another reason).  Some redness, tenderness and white or yellow material in the wound is normal healing.  If the area becomes very sore and red, or develops a thick yellow-green material (pus), it may be infected; please notify us .    If you have stitches, return to clinic as directed to have the stitches removed. You will continue wound care for 2-3 days after the stitches  are removed.   Wound healing continues for up to one year following surgery. It is not unusual to experience pain in the scar from time to time during the interval.  If the pain becomes severe or the scar thickens, you should notify the office.    A slight amount of redness in a scar is expected for the first six months.  After six months, the redness will fade and the scar will soften and fade.  The color difference becomes less noticeable with time.  If there are any problems, return for a post-op surgery check at your earliest convenience.  To improve the appearance of the scar, you can use silicone scar gel, cream, or sheets (such as Mederma or Serica) every night for up to one year. These are available over the counter (without a prescription).  Please call our office at [phone] for any questions or concerns.      Seborrheic Keratosis  What causes seborrheic keratoses? Seborrheic keratoses are harmless, common skin growths that first appear during adult life.  As time goes by, more growths appear.  Some people may develop a large number of them.  Seborrheic keratoses appear on both covered and uncovered body parts.  They are not caused by sunlight.  The tendency to develop seborrheic keratoses can be inherited.  They vary in color from skin-colored to gray, brown, or even black.  They can be either smooth or have a rough, warty surface.   Seborrheic keratoses are superficial and look as if they were stuck on the skin.  Under the microscope this type of keratosis looks like layers upon layers of skin.  That is why at times the top layer may seem to fall off, but the rest of the growth remains and re-grows.    Treatment Seborrheic keratoses do not need to be treated, but can easily be removed in the office.  Seborrheic keratoses often cause symptoms when they rub on clothing or jewelry.  Lesions can be in the way of shaving.  If they become inflamed, they can cause itching, soreness, or  burning.  Removal of a seborrheic keratosis can be accomplished by freezing, burning, or surgery. If any spot bleeds, scabs, or grows rapidly, please return to have it checked, as these can be an indication of a skin cancer.   Cryotherapy Aftercare  Wash gently with soap and water everyday.   Apply Vaseline and Band-Aid daily until healed.     Due to recent changes in healthcare laws, you may see results of your pathology and/or laboratory studies on MyChart before the doctors have had a chance to review them. We understand that in some cases there may be results that are confusing or concerning to you. Please understand that not all results are received at the same time and often the doctors may need to interpret multiple results in order to provide you with the best plan of care or course of treatment. Therefore, we ask that you please give us  2 business days to thoroughly review all your results before contacting the office for clarification. Should we see a critical lab result, you will be contacted sooner.   If You Need Anything After Your Visit  If you have any questions or concerns for your doctor, please call our main line at [phone] and press option 4 to reach your doctor's medical assistant. If no one answers, please leave a voicemail as directed and we will return your call as soon as possible. Messages left after 4 pm will be answered the following business day.   You may also send us  a message via MyChart. We typically respond to MyChart messages within 1-2 business days.  For prescription refills, please ask your pharmacy to contact our office. Our fax number is [phone].  If you have an urgent issue when the clinic is closed that cannot wait until the next business day, you can page your doctor at the number below.    Please note that while we do our best to be available for urgent issues outside of office hours, we are not available 24/7.   If you have an urgent  issue and are unable to reach us , you may choose to seek medical care at your doctor's office, retail clinic, urgent care center, or emergency room.  If you have a medical emergency, please immediately call 911 or go to the emergency department.  Pager Numbers  - Dr. Hester: [phone]  - Dr. Jackquline: [phone]  - Dr. Claudene: [phone]   - Dr. Raymund: [phone]  In the event of inclement weather, please call our main line at [phone] for an update on the status of any delays or closures.  Dermatology Medication Tips: Please keep the boxes that topical medications come in in order to help keep track of the instructions about where and how to use these. Pharmacies typically print the medication instructions only on  the boxes and not directly on the medication tubes.   If your medication is too expensive, please contact our office at [phone] option 4 or send us  a message through MyChart.   We are unable to tell what your co-pay for medications will be in advance as this is different depending on your insurance coverage. However, we may be able to find a substitute medication at lower cost or fill out paperwork to get insurance to cover a needed medication.   If a prior authorization is required to get your medication covered by your insurance company, please allow us  1-2 business days to complete this process.  Drug prices often vary depending on where the prescription is filled and some pharmacies may offer cheaper prices.  The website www.goodrx.com contains coupons for medications through different pharmacies. The prices here do not account for what the cost may be with help from insurance (it may be cheaper with your insurance), but the website can give you the price if you did not use any insurance.  - You can print the associated coupon and take it with your prescription to the pharmacy.  - You may also stop by our office during regular business hours and pick up a  GoodRx coupon card.  - If you need your prescription sent electronically to a different pharmacy, notify our office through [facility] or by phone at [phone] option 4.     Si Usted Necesita Algo Despus de Su Visita  Tambin puede enviarnos un mensaje a travs de Clinical cytogeneticist. Por lo general respondemos a los mensajes de MyChart en el transcurso de 1 a 2 das hbiles.  Para renovar recetas, por favor pida a su farmacia que se ponga en contacto con nuestra oficina. Randi lakes de fax es [place] [phone].  Si tiene un asunto urgente cuando la clnica est cerrada y que no puede esperar hasta el siguiente da hbil, puede llamar/localizar a su doctor(a) al nmero que aparece a continuacin.   Por favor, tenga en cuenta que aunque hacemos todo lo posible para estar disponibles para asuntos urgentes fuera del horario de [place], no estamos disponibles las 24 horas del da, los 7 [address]  [address] de la [place].   Si tiene un problema urgente y no puede comunicarse con nosotros, puede optar por buscar atencin mdica  en el consultorio de su doctor(a), en una clnica privada, en un centro de atencin urgente o en una sala de emergencias.  Si tiene Engineer, drilling, por favor llame inmediatamente al 911 o vaya a la sala de emergencias.  Nmeros de bper  - Dr. Hester: [phone]  - Dra. Jackquline: [phone]  - Dr. Claudene: [phone]  - Dra. Kitts: [phone]  En caso de inclemencias del [place], por favor llame a nuestra lnea principal al [phone] para una actualizacin sobre el estado de cualquier retraso o cierre.  Consejos para la medicacin en dermatologa: Por favor, guarde las cajas en las que vienen los medicamentos de uso tpico para ayudarle a seguir las instrucciones sobre dnde y cmo usarlos. Las farmacias generalmente imprimen las instrucciones del medicamento slo en las cajas y no directamente en los tubos del [place].   Si su medicamento es muy caro, por  favor, pngase en contacto con landry rieger llamando al [phone] y presione la opcin 4 o envenos un mensaje a travs de Clinical cytogeneticist.   No podemos decirle cul ser su copago por los medicamentos por adelantado ya que esto es diferente dependiendo de la cobertura de su seguro.  Sin embargo, es posible que podamos encontrar un medicamento sustituto a Audiological scientist un formulario para que el seguro cubra el medicamento que se considera necesario.   Si se requiere una autorizacin previa para que su compaa de seguros malta su medicamento, por favor permtanos de 1 a 2 das hbiles para completar este proceso.  Los precios de los medicamentos varan con frecuencia dependiendo del Environmental consultant de dnde se surte la receta y alguna farmacias pueden ofrecer precios ms baratos.  El sitio web www.goodrx.com tiene cupones para medicamentos de Health and safety inspector. Los precios aqu no tienen en cuenta lo que podra costar con la ayuda del seguro (puede ser ms barato con su seguro), pero el sitio web puede darle el precio si no utiliz Tourist information centre manager.  - Puede imprimir el cupn correspondiente y llevarlo con su receta a la farmacia.  - Tambin puede pasar por nuestra oficina durante el horario de atencin regular y Education officer, museum una tarjeta de cupones de GoodRx.  - Si necesita que su receta se enve electrnicamente a una farmacia diferente, informe a nuestra oficina a travs de MyChart de [place] o por telfono llamando al [phone] y presione la opcin 4.

## 2024-05-23 ENCOUNTER — Telehealth: Payer: Self-pay

## 2024-05-23 NOTE — Telephone Encounter (Signed)
 Called patient today to see how he was feeling after BBL treatment yesterday, patient states redness has subsided and is feeling good. No adverse reactions reported. Patient scheduled for BBL procedure in March.    Advised to call if he had any further concerns.

## 2024-05-27 ENCOUNTER — Ambulatory Visit: Payer: Self-pay | Admitting: Dermatology

## 2024-05-27 LAB — SURGICAL PATHOLOGY

## 2024-05-28 NOTE — Telephone Encounter (Addendum)
 Called and discussed results with patient. He verbalized understanding and denied further questions. ----- Message from Alm Rhyme, MD sent at 05/27/2024  6:10 PM EST ----- FINAL DIAGNOSIS        1. Skin, right glabella :       SEBORRHEIC KERATOSIS, IRRITATED, BASE INVOLVED   Benign irritated keratosis No further treatment needed unless recurs

## 2024-06-17 ENCOUNTER — Ambulatory Visit (INDEPENDENT_AMBULATORY_CARE_PROVIDER_SITE_OTHER): Payer: Medicare PPO

## 2024-06-17 VITALS — BP 118/68 | HR 88 | Ht 69.0 in | Wt 209.4 lb

## 2024-06-17 DIAGNOSIS — Z0000 Encounter for general adult medical examination without abnormal findings: Secondary | ICD-10-CM | POA: Diagnosis not present

## 2024-06-17 NOTE — Progress Notes (Signed)
 "  Chief Complaint  Patient presents with   Medicare Wellness     Subjective:   Austin Knapp is a 79 y.o. male who presents for a Medicare Annual Wellness Visit.  Visit info / Clinical Intake: Medicare Wellness Visit Type:: Subsequent Annual Wellness Visit Persons participating in visit and providing information:: patient Medicare Wellness Visit Mode:: In-person (required for WTM) Interpreter Needed?: No Pre-visit prep was completed: yes AWV questionnaire completed by patient prior to visit?: yes Date:: 06/13/24 Living arrangements:: (!) lives alone Patient's Overall Health Status Rating: good Typical amount of pain: some Does pain affect daily life?: no Are you currently prescribed opioids?: no  Dietary Habits and Nutritional Risks How many meals a day?: 3 Eats fruit and vegetables daily?: yes Most meals are obtained by: preparing own meals In the last 2 weeks, have you had any of the following?: none Diabetic:: no  Functional Status Activities of Daily Living (to include ambulation/medication): Independent Ambulation: Independent with device- listed below Home Assistive Devices/Equipment: CPAP (Bi-Pap) Medication Administration: Independent Home Management (perform basic housework or laundry): Independent Manage your own finances?: yes Primary transportation is: driving Concerns about vision?: no *vision screening is required for WTM* Concerns about hearing?: no  Fall Screening Falls in the past year?: 0 Number of falls in past year: 0 Was there an injury with Fall?: 0 Fall Risk Category Calculator: 0 Patient Fall Risk Level: Low Fall Risk  Fall Risk Patient at Risk for Falls Due to: No Fall Risks Fall risk Follow up: Falls evaluation completed; Falls prevention discussed  Home and Transportation Safety: All rugs have non-skid backing?: yes All stairs or steps have railings?: N/A, no stairs Grab bars in the bathtub or shower?: yes Have non-skid surface in  bathtub or shower?: (!) no Good home lighting?: yes Regular seat belt use?: yes Hospital stays in the last year:: no  Cognitive Assessment Difficulty concentrating, remembering, or making decisions? : no Will 6CIT or Mini Cog be Completed: yes What year is it?: 0 points What month is it?: 0 points Give patient an address phrase to remember (5 components): [address] [place],, [place] About what time is it?: 0 points Count backwards from 20 to 1: 0 points Say the months of the year in reverse: 0 points Repeat the address phrase from earlier: 0 points 6 CIT Score: 0 points  Advance Directives (For Healthcare) Does Patient Have a Medical Advance Directive?: Yes Does patient want to make changes to medical advance directive?: Yes (Inpatient - patient requests chaplain consult to change a medical advance directive) Type of Advance Directive: Healthcare Power of [place]; Living will Copy of Healthcare Power of Attorney in Chart?: No - copy requested Copy of Living Will in Chart?: No - copy requested  Reviewed/Updated  Reviewed/Updated: Reviewed All (Medical, Surgical, Family, Medications, Allergies, Care Teams, Patient Goals)    Allergies (verified) Clindamycin/lincomycin and Latex   Current Medications (verified) Outpatient Encounter Medications as of 06/17/2024  Medication Sig   allopurinol  (ZYLOPRIM ) 300 MG tablet ONE TABLET BY MOUTH ONE TIME DAILY   ANDROGEL  PUMP 20.25 MG/ACT (1.62%) GEL APPLY 4 PUMPS ONCE DAILY AS DIRECTED.   aspirin 81 MG tablet Take 81 mg by mouth daily.   cetirizine (ZYRTEC) 10 MG tablet Take 10 mg by mouth daily as needed for allergies.   colchicine  0.6 MG tablet Take 1 tablet (0.6 mg total) by mouth daily. (Patient taking differently: Take 0.6 mg by mouth daily as needed.)   doxazosin (CARDURA) 8 MG tablet  Take 8 mg by mouth daily.   fluticasone  (FLONASE ) 50 MCG/ACT nasal spray instill 1 spray into each nostril once daily   hydrochlorothiazide   (HYDRODIURIL ) 25 MG tablet Take 25 mg by mouth daily.   hydrocortisone  2.5 % cream Apply topically 2 (two) times daily.   levothyroxine (SYNTHROID) 25 MCG tablet Take 25 mcg by mouth daily before breakfast.   Melatonin 5 MG/15ML LIQD Take 15 mg by mouth at bedtime as needed.   meloxicam (MOBIC) 15 MG tablet Take 15 mg by mouth as needed for pain.   Multiple Vitamin (MULTIVITAMIN) tablet Take 1 tablet by mouth daily.   tretinoin  (RETIN-A ) 0.025 % cream Apply pea sized amount to face nightly as tolerated   Turmeric Curcumin 500 MG CAPS Take by mouth daily.   vitamin E 180 MG (400 UNITS) capsule Take 400 Units by mouth daily.   ZINC-VITAMIN C PO Take 2 capsules by mouth daily. With vitamin D, Quercetin   Cyanocobalamin (VITAMIN B-12 PO) Take by mouth daily. (Patient not taking: Reported on 06/17/2024)   hydrOXYzine  (VISTARIL ) 25 MG capsule Take 1 capsule (25 mg total) by mouth every 8 (eight) hours as needed for itching. Caution of sedation (Patient not taking: Reported on 06/17/2024)   [DISCONTINUED] lisinopril-hydrochlorothiazide  (PRINZIDE,ZESTORETIC) 10-12.5 MG per tablet Take 1 tablet by mouth daily.   No facility-administered encounter medications on file as of 06/17/2024.    History: Past Medical History:  Diagnosis Date   Actinic keratosis    Central sleep apnea    Hypertension    Idiopathic gout    Low testosterone     Obstructive sleep apnea    Sleep apnea    sever - BiPAP. 56 Hypopneas/hr   Wears hearing aid in both ears    Past Surgical History:  Procedure Laterality Date   CATARACT EXTRACTION W/PHACO Left 09/27/2023   Procedure: PHACOEMULSIFICATION, CATARACT, WITH IOL INSERTION 7.88 00:46.5;  Surgeon: Mittie Gaskin, MD;  Location: [facility];  Service: Ophthalmology;  Laterality: Left;   CATARACT EXTRACTION W/PHACO Right 10/11/2023   Procedure: PHACOEMULSIFICATION, CATARACT, WITH IOL INSERTION 7.16 00:31.9;  Surgeon: Mittie Gaskin, MD;  Location: [facility];  Service: Ophthalmology;  Laterality: Right;   COLONOSCOPY WITH PROPOFOL  N/A 01/19/2015   Procedure: COLONOSCOPY WITH PROPOFOL ;  Surgeon: Lamar ONEIDA Holmes, MD;  Location: [facility];  Service: Endoscopy;  Laterality: N/A;   HERNIA REPAIR Right 08/2012   MASTOIDECTOMY     tongue excision     04/2012   VASECTOMY     Family History  Problem Relation Age of Onset   Macular degeneration Mother    Glaucoma Mother    Cancer Father    AAA (abdominal aortic aneurysm) Father    Cancer Sister    Kidney cancer Brother    Healthy Son    Healthy Daughter    Healthy Daughter    Social History   Occupational History   Not on file  Tobacco Use   Smoking status: Former    Current packs/day: 0.00    Types: Cigarettes    Quit date: 1990    Years since quitting: 36.0    Passive exposure: Never   Smokeless tobacco: Never   Tobacco comments:    Quit over 35 years ago. Smoked off and on for for approx 18 yrs  Vaping Use   Vaping status: Never Used  Substance and Sexual Activity   Alcohol use: Yes    Alcohol/week: 2.0 standard drinks of alcohol    Types: 1 Glasses  of wine, 1 Cans of beer per week    Comment: rare   Drug use: No   Sexual activity: Yes   Tobacco Counseling Counseling given: Yes Tobacco comments: Quit over 35 years ago. Smoked off and on for for approx 18 yrs  SDOH Screenings   Food Insecurity: No Food Insecurity (06/17/2024)  Housing: Unknown (06/17/2024)  Transportation Needs: No Transportation Needs (06/17/2024)  Utilities: Not At Risk (06/17/2024)  Alcohol Screen: Low Risk (06/13/2024)  Depression (PHQ2-9): Low Risk (06/17/2024)  Financial Resource Strain: Low Risk (06/13/2024)  Physical Activity: Insufficiently Active (06/17/2024)  Social Connections: Moderately Integrated (06/17/2024)  Stress: No Stress Concern Present (06/17/2024)  Tobacco Use: Medium Risk (06/17/2024)  Health Literacy: Adequate Health Literacy (06/17/2024)   See flowsheets for full screening  details  Depression Screen PHQ 2 & 9 Depression Scale- Over the past 2 weeks, how often have you been bothered by any of the following problems? Little interest or pleasure in doing things: 0 Feeling down, depressed, or hopeless (PHQ Adolescent also includes...irritable): 0 PHQ-2 Total Score: 0 Trouble falling or staying asleep, or sleeping too much: 0 Feeling tired or having little energy: 0 Poor appetite or overeating (PHQ Adolescent also includes...weight loss): 0 Feeling bad about yourself - or that you are a failure or have let yourself or your family down: 0 Trouble concentrating on things, such as reading the newspaper or watching television (PHQ Adolescent also includes...like school work): 0 Moving or speaking so slowly that other people could have noticed. Or the opposite - being so fidgety or restless that you have been moving around a lot more than usual: 0 Thoughts that you would be better off dead, or of hurting yourself in some way: 0 PHQ-9 Total Score: 0 If you checked off any problems, how difficult have these problems made it for you to do your work, take care of things at home, or get along with other people?: Not difficult at all  Depression Treatment Depression Interventions/Treatment : EYV7-0 Score <4 Follow-up Not Indicated; Medication; Currently on Treatment     Goals Addressed               This Visit's Progress     Patient Stated (pt-stated)        Patient stated he plans to continue staying active and get to the gym more this year             Objective:    Today's Vitals   06/17/24 1516  BP: 118/68  Pulse: 88  SpO2: 94%  Weight: 209 lb 6.4 oz (95 kg)  Height: 5' 9 (1.753 m)   Body mass index is 30.92 kg/m.  Hearing/Vision screen Hearing Screening - Comments:: Denies hearing difficulties   Vision Screening - Comments:: Denies vision concerns - up-do-date eye exams with [place] Immunizations and Health Maintenance Health Maintenance   Topic Date Due   COVID-19 Vaccine (1) 02/07/2026 (Originally 05/04/1946)   Medicare Annual Wellness (AWV)  06/17/2025   DTaP/Tdap/Td (3 - Td or Tdap) 03/17/2033   Pneumococcal Vaccine: 50+ Years  Completed   Influenza Vaccine  Completed   Hepatitis C Screening  Completed   Zoster Vaccines- Shingrix  Completed   Meningococcal B Vaccine  Aged Out   Colonoscopy  Discontinued        Assessment/Plan:  This is a routine wellness examination for Austin Knapp.  Patient Care Team: Tower, Laine LABOR, MD as PCP - General (Family Medicine) Tonna Anes, MD as Referring Physician (Hematology and Oncology) Pressley,  Zachary POUR, PA-C as Consulting Physician (Physician Assistant) Mittie Gaskin, MD as Referring Physician (Ophthalmology)  I have personally reviewed and noted the following in the patients chart:   Medical and social history Use of alcohol, tobacco or illicit drugs  Current medications and supplements including opioid prescriptions. Functional ability and status Nutritional status Physical activity Advanced directives List of other physicians Hospitalizations, surgeries, and ER visits in previous 12 months Vitals Screenings to include cognitive, depression, and falls Referrals and appointments  No orders of the defined types were placed in this encounter.  In addition, I have reviewed and discussed with patient certain preventive protocols, quality metrics, and best practice recommendations. A written personalized care plan for preventive services as well as general preventive health recommendations were provided to patient.   Austin Knapp, CMA   06/17/2024   Return in 1 year (on 06/17/2025).  After Visit Summary: (In Person-Declined) Patient declined AVS at this time.  Nurse Notes: scheduled 2027 AWV/CPE appts "

## 2024-06-17 NOTE — Patient Instructions (Addendum)
 Austin Knapp,  Thank you for taking the time for your Medicare Wellness Visit. I appreciate your continued commitment to your health goals. Please review the care plan we discussed, and feel free to reach out if I can assist you further.  Please note that Annual Wellness Visits do not include a physical exam. Some assessments may be limited, especially if the visit was conducted virtually. If needed, we may recommend an in-person follow-up with your provider.  Ongoing Care Seeing your primary care provider every 3 to 6 months helps us  monitor your health and provide consistent, personalized care.   Referrals If a referral was made during today's visit and you haven't received any updates within two weeks, please contact the referred provider directly to check on the status.  Recommended Screenings:  Health Maintenance  Topic Date Due   COVID-19 Vaccine (4 - 2025-26 season) 02/07/2026*   Medicare Annual Wellness Visit  06/17/2025   DTaP/Tdap/Td vaccine (3 - Td or Tdap) 03/17/2033   Pneumococcal Vaccine for age over 29  Completed   Flu Shot  Completed   Hepatitis C Screening  Completed   Zoster (Shingles) Vaccine  Completed   Meningitis B Vaccine  Aged Out   Colon Cancer Screening  Discontinued  *Topic was postponed. The date shown is not the original due date.       10/11/2023    9:58 AM  Advanced Directives  Does Patient Have a Medical Advance Directive? Yes  Type of Estate Agent of [place];Living will  Does patient want to make changes to medical advance directive? No - Patient declined  Copy of Healthcare Power of Attorney in Chart? No - copy requested    Vision: Annual vision screenings are recommended for early detection of glaucoma, cataracts, and diabetic retinopathy. These exams can also reveal signs of chronic conditions such as diabetes and high blood pressure.  Dental: Annual dental screenings help detect early signs of oral cancer, gum disease, and  other conditions linked to overall health, including heart disease and diabetes.

## 2024-06-28 ENCOUNTER — Ambulatory Visit: Admitting: Family Medicine

## 2024-06-28 ENCOUNTER — Encounter: Payer: Self-pay | Admitting: Family Medicine

## 2024-06-28 VITALS — BP 126/70 | HR 54 | Temp 97.6°F | Ht 68.75 in | Wt 208.4 lb

## 2024-06-28 DIAGNOSIS — I10 Essential (primary) hypertension: Secondary | ICD-10-CM | POA: Diagnosis not present

## 2024-06-28 DIAGNOSIS — G4733 Obstructive sleep apnea (adult) (pediatric): Secondary | ICD-10-CM | POA: Diagnosis not present

## 2024-06-28 DIAGNOSIS — E349 Endocrine disorder, unspecified: Secondary | ICD-10-CM | POA: Diagnosis not present

## 2024-06-28 DIAGNOSIS — Z125 Encounter for screening for malignant neoplasm of prostate: Secondary | ICD-10-CM

## 2024-06-28 DIAGNOSIS — Z0000 Encounter for general adult medical examination without abnormal findings: Secondary | ICD-10-CM

## 2024-06-28 DIAGNOSIS — E039 Hypothyroidism, unspecified: Secondary | ICD-10-CM | POA: Diagnosis not present

## 2024-06-28 DIAGNOSIS — M109 Gout, unspecified: Secondary | ICD-10-CM

## 2024-06-28 LAB — LIPID PANEL
Cholesterol: 171 mg/dL (ref 28–200)
HDL: 36.7 mg/dL — ABNORMAL LOW
LDL Cholesterol: 107 mg/dL — ABNORMAL HIGH (ref 10–99)
NonHDL: 134.02
Total CHOL/HDL Ratio: 5
Triglycerides: 137 mg/dL (ref 10.0–149.0)
VLDL: 27.4 mg/dL (ref 0.0–40.0)

## 2024-06-28 LAB — COMPREHENSIVE METABOLIC PANEL WITH GFR
ALT: 19 U/L (ref 3–53)
AST: 18 U/L (ref 5–37)
Albumin: 4.2 g/dL (ref 3.5–5.2)
Alkaline Phosphatase: 43 U/L (ref 39–117)
BUN: 23 mg/dL (ref 6–23)
CO2: 33 meq/L — ABNORMAL HIGH (ref 19–32)
Calcium: 9.2 mg/dL (ref 8.4–10.5)
Chloride: 103 meq/L (ref 96–112)
Creatinine, Ser: 0.91 mg/dL (ref 0.40–1.50)
GFR: 81.21 mL/min
Glucose, Bld: 82 mg/dL (ref 70–99)
Potassium: 4.4 meq/L (ref 3.5–5.1)
Sodium: 140 meq/L (ref 135–145)
Total Bilirubin: 0.8 mg/dL (ref 0.2–1.2)
Total Protein: 6.3 g/dL (ref 6.0–8.3)

## 2024-06-28 LAB — CBC WITH DIFFERENTIAL/PLATELET
Basophils Absolute: 0.1 K/uL (ref 0.0–0.1)
Basophils Relative: 1.2 % (ref 0.0–3.0)
Eosinophils Absolute: 0.2 K/uL (ref 0.0–0.7)
Eosinophils Relative: 4.4 % (ref 0.0–5.0)
HCT: 49.8 % (ref 39.0–52.0)
Hemoglobin: 16.7 g/dL (ref 13.0–17.0)
Lymphocytes Relative: 25.1 % (ref 12.0–46.0)
Lymphs Abs: 1.1 K/uL (ref 0.7–4.0)
MCHC: 33.6 g/dL (ref 30.0–36.0)
MCV: 97.6 fl (ref 78.0–100.0)
Monocytes Absolute: 0.5 K/uL (ref 0.1–1.0)
Monocytes Relative: 10.8 % (ref 3.0–12.0)
Neutro Abs: 2.6 K/uL (ref 1.4–7.7)
Neutrophils Relative %: 58.5 % (ref 43.0–77.0)
Platelets: 152 K/uL (ref 150.0–400.0)
RBC: 5.1 Mil/uL (ref 4.22–5.81)
RDW: 14.4 % (ref 11.5–15.5)
WBC: 4.4 K/uL (ref 4.0–10.5)

## 2024-06-28 LAB — PSA, MEDICARE: PSA: 2.01 ng/mL (ref 0.10–4.00)

## 2024-06-28 LAB — TSH: TSH: 4.46 u[IU]/mL (ref 0.35–5.50)

## 2024-06-28 NOTE — Assessment & Plan Note (Signed)
 Psa today  No clinical changes Goes to the [facility] Also has testosterone  supplement

## 2024-06-28 NOTE — Assessment & Plan Note (Signed)
 Continues tesosterone replacement from the [facility] No clinical changes

## 2024-06-28 NOTE — Patient Instructions (Addendum)
 Aim for 30 minutes (or more) of exercise 5 days weekly  Add some strength training to your routine, this is important for bone and brain health and can reduce your risk of falls and help your body use insulin properly and regulate weight  Light weights, exercise bands , and internet videos are a good way to start  Yoga (chair or regular), machines , floor exercises or a gym with machines are also good options    Take care of yourself   Labs today

## 2024-06-28 NOTE — Assessment & Plan Note (Signed)
 TSH today  Levothyroxine 100 mcg daily from [facility]  No clinical changes

## 2024-06-28 NOTE — Assessment & Plan Note (Signed)
 Per pt both obstructive and complex sleep apnea Uses bipap and this works well  Seen at the [facility]

## 2024-06-28 NOTE — Assessment & Plan Note (Signed)
 bp in fair control at this time without any medication  BP Readings from Last 1 Encounters:  06/28/24 126/70   No changes needed right now  Takes doxazosin 8 mg daily from [facility] for urology /voiding  Hydrochlorothiazide  25 mg daily Most recent labs reviewed  Disc lifstyle change with low sodium diet and exercise  Lab today

## 2024-06-28 NOTE — Progress Notes (Unsigned)
 "  Subjective:    Patient ID: Austin Knapp, male    DOB: 16-Nov-1945, 79 y.o.   MRN: [id number]  HPI  Here for health maintenance exam and to review chronic medical problems   Wt Readings from Last 3 Encounters:  06/28/24 208 lb 6 oz (94.5 kg)  06/17/24 209 lb 6.4 oz (95 kg)  04/17/24 208 lb 3.2 oz (94.4 kg)   31.00 kg/m  Vitals:   06/28/24 0816  BP: 126/70  Pulse: (!) 54  Temp: 97.6 F (36.4 C)  SpO2: 96%    Immunization History  Administered Date(s) Administered   Fluad Trivalent(High Dose 65+) 04/22/2024   INFLUENZA, HIGH DOSE SEASONAL PF 02/12/2016, 03/03/2017, 02/21/2022, 04/19/2023   Influenza-Unspecified 03/13/2014, 02/11/2018   Pneumococcal Conjugate-13 12/17/2013   Pneumococcal Polysaccharide-23 03/25/2010, 10/09/2015   Tdap 10/09/2015, 03/18/2023   Zoster Recombinant(Shingrix) 11/08/2016, 02/06/2017   Zoster, Live 01/27/2010    There are no preventive care reminders to display for this patient.   Doing well  Feels ok    Prostate health Lab Results  Component Value Date   PSA 2.01 06/28/2024   PSA 1.13 01/04/2019   PSA 1.11 09/12/2017   Testosterone  replacement   Colon cancer screening  Colonoscopy 07/2020  Bone health   Falls-none  Fractures-none  Supplements    Exercise  Active but not a lot extra  Just bought some kettle bells   Is a sugar addict   Has regular derm care    Mood    06/28/2024    8:20 AM 06/17/2024    3:19 PM 01/23/2024   10:31 AM 01/16/2024   11:07 AM 06/21/2023   10:12 AM  Depression screen PHQ 2/9  Decreased Interest 0 0 0 0 0  Down, Depressed, Hopeless 0 0 0 0 0  PHQ - 2 Score 0 0 0 0 0  Altered sleeping 0 0 1 1 0  Tired, decreased energy 1 0 1 1 1   Change in appetite 2 0 0 1 0  Feeling bad or failure about yourself  0 0 0 0 0  Trouble concentrating 0 0 0 0 0  Moving slowly or fidgety/restless 0 0 0 0 0  Suicidal thoughts 0 0 0 0 0  PHQ-9 Score 3 0 2  3  1    Difficult doing work/chores Not difficult at  all Not difficult at all Not difficult at all Not difficult at all Not difficult at all     Data saved with a previous flowsheet row definition   HTN bp is stable today  No cp or palpitations or headaches or edema  No side effects to medicines  BP Readings from Last 3 Encounters:  06/28/24 126/70  06/17/24 118/68  04/17/24 128/79     Lab Results  Component Value Date   NA 140 06/28/2024   K 4.4 06/28/2024   CO2 33 (H) 06/28/2024   GLUCOSE 82 06/28/2024   BUN 23 06/28/2024   CREATININE 0.91 06/28/2024   CALCIUM 9.2 06/28/2024   GFR 81.21 06/28/2024   EGFR 78 11/02/2023   GFRNONAA >60 03/08/2012   Hct 25 mg daily  Doxazosin 8 mg daily  OSA and complex sleep apnea  Uses bipap     Hypothyroidism  Pt has no clinical changes No change in energy level/ hair or skin/ edema and no tremor Lab Results  Component Value Date   TSH 4.46 06/28/2024    Levothyroxine 25 mcg daily    Goes to [facility]  Gout -no  recent flares  Allopurinol  300 mg daily from rheumatology  Colchicine  prn   Lab Results  Component Value Date   LABURIC 4.7 04/17/2024     Cholesterol  Lab Results  Component Value Date   CHOL 171 06/28/2024   HDL 36.70 (L) 06/28/2024   LDLCALC 107 (H) 06/28/2024   TRIG 137.0 06/28/2024   CHOLHDL 5 06/28/2024     Patient Active Problem List   Diagnosis Date Noted   Bilateral lower extremity edema 01/16/2024   Hearing loss 06/21/2023   Elevated antinuclear antibody (ANA) level 05/24/2023   Gout 05/08/2023   Routine general medical examination at a health care facility 01/10/2019   Left elbow pain 10/01/2018   Prostate cancer screening 09/12/2017   Family history of abdominal aortic aneurysm (AAA) 09/12/2017   Hypothyroidism 09/12/2017   OSA on CPAP 08/14/2014   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  Treatments were effective   Cataract    Being monitored by opthmologist   Central sleep apnea    Depression    Rare, but sometimes I do get down   Glaucoma    Being watched by opthmalogist   Hypertension    Idiopathic gout    Low testosterone     Obstructive sleep apnea    Sleep apnea    sever - BiPAP. 56 Hypopneas/hr   Thyroid  disease    Resulting from 35 radiation treatments for cancer   Wears hearing aid in both ears    Past Surgical History:  Procedure Laterality Date   CATARACT EXTRACTION W/PHACO Left 09/27/2023   Procedure: PHACOEMULSIFICATION, CATARACT, WITH IOL INSERTION 7.88 00:46.5;  Surgeon: Mittie Gaskin, MD;  Location[facility];  Service: Ophthalmology;  Laterality: Left;   CATARACT EXTRACTION W/PHACO Right 10/11/2023   Procedure: PHACOEMULSIFICATION, CATARACT, WITH IOL INSERTION 7.16 00:31.9;  Surgeon: Mittie Gaskin, MD;  Location: [facility];  Service: Ophthalmology;  Laterality: Right;   COLONOSCOPY WITH PROPOFOL  N/A 01/19/2015   Procedure: COLONOSCOPY WITH PROPOFOL ;  Surgeon: Lamar ONEIDA Holmes, MD;  Location: [facility];  Service: Endoscopy;  Laterality: N/A;   COSMETIC SURGERY     Scar removal fromchin   HERNIA REPAIR Right 08/11/2012   Right side   MASTOIDECTOMY     tongue excision     04/2012   VASECTOMY     Social History[1] Family History  Problem Relation Age of Onset   Macular degeneration Mother    Glaucoma Mother    Diabetes Mother    Heart disease Mother    Vision loss Mother    Cancer Father    AAA (abdominal aortic aneurysm) Father    Alcohol abuse Father    Early death Father    Cancer Sister    Kidney cancer Brother    Healthy Son    Healthy Daughter    Healthy Daughter    Anxiety disorder Daughter    Anxiety disorder Daughter    Kidney disease Brother    Cancer Brother    Stroke Brother     Obesity Brother    Allergies[2] Medications Ordered Prior to Encounter[3]  Review of Systems  Constitutional:  Negative for activity change, appetite change, fatigue, fever and unexpected  weight change.  HENT:  Negative for congestion, rhinorrhea, sore throat and trouble swallowing.   Eyes:  Negative for pain, redness, itching and visual disturbance.  Respiratory:  Negative for cough, chest tightness, shortness of breath and wheezing.   Cardiovascular:  Negative for chest pain and palpitations.  Gastrointestinal:  Negative for abdominal pain, blood in stool, constipation, diarrhea and nausea.  Endocrine: Negative for cold intolerance, heat intolerance, polydipsia and polyuria.  Genitourinary:  Negative for difficulty urinating, dysuria, frequency and urgency.  Musculoskeletal:  Negative for arthralgias, joint swelling and myalgias.  Skin:  Negative for pallor and rash.  Neurological:  Negative for dizziness, tremors, weakness, numbness and headaches.  Hematological:  Negative for adenopathy. Does not bruise/bleed easily.  Psychiatric/Behavioral:  Negative for decreased concentration and dysphoric mood. The patient is not nervous/anxious.        Objective:   Physical Exam Constitutional:      General: He is not in acute distress.    Appearance: Normal appearance. He is well-developed. He is obese. He is not ill-appearing or diaphoretic.  HENT:     Head: Normocephalic and atraumatic.     Right Ear: Tympanic membrane, ear canal and external ear normal.     Left Ear: Tympanic membrane, ear canal and external ear normal.     Nose: Nose normal. No congestion.     Mouth/Throat:     Mouth: Mucous membranes are moist.     Pharynx: Oropharynx is clear. No posterior oropharyngeal erythema.  Eyes:     General: No scleral icterus.       Right eye: No discharge.        Left eye: No discharge.     Conjunctiva/sclera: Conjunctivae normal.     Pupils: Pupils are equal, round, and reactive to  light.  Neck:     Thyroid : No thyromegaly.     Vascular: No carotid bruit or JVD.  Cardiovascular:     Rate and Rhythm: Normal rate and regular rhythm.     Pulses: Normal pulses.     Heart sounds: Normal heart sounds.     No gallop.  Pulmonary:     Effort: Pulmonary effort is normal. No respiratory distress.     Breath sounds: Normal breath sounds. No wheezing or rales.     Comments: Good air exch Chest:     Chest wall: No tenderness.  Abdominal:     General: Bowel sounds are normal. There is no distension or abdominal bruit.     Palpations: Abdomen is soft. There is no mass.     Tenderness: There is no abdominal tenderness.     Hernia: No hernia is present.  Musculoskeletal:        General: No tenderness.     Cervical back: Normal range of motion and neck supple. No rigidity. No muscular tenderness.     Right lower leg: No edema.     Left lower leg: No edema.  Lymphadenopathy:     Cervical: No cervical adenopathy.  Skin:    General: Skin is warm and dry.     Coloration: Skin is not pale.     Findings: No erythema or rash.     Comments: Solar lentigines diffusely   Neurological:     Mental Status: He is alert.     Cranial Nerves: No cranial nerve deficit.     Motor: No abnormal muscle tone.     Coordination: Coordination normal.     Gait: Gait normal.     Deep  Tendon Reflexes: Reflexes are normal and symmetric. Reflexes normal.  Psychiatric:        Mood and Affect: Mood normal.        Cognition and Memory: Cognition and memory normal.           Assessment & Plan:   Problem List Items Addressed This Visit       Cardiovascular and Mediastinum   Hypertension   bp in fair control at this time without any medication  BP Readings from Last 1 Encounters:  06/28/24 126/70   No changes needed right now  Takes doxazosin 8 mg daily from [facility] for urology /voiding  Hydrochlorothiazide  25 mg daily Most recent labs reviewed  Disc lifstyle change with low sodium diet  and exercise  Lab today      Relevant Orders   CBC with Differential/Platelet (Completed)   Lipid Panel (Completed)   Comprehensive metabolic panel with GFR (Completed)   TSH (Completed)     Respiratory   OSA on CPAP   Per pt both obstructive and complex sleep apnea Uses bipap and this works well  Seen at the [facility]        Endocrine   Hypothyroidism   TSH today  Levothyroxine 100 mcg daily from [facility]  No clinical changes       Relevant Orders   TSH (Completed)     Other   Testosterone  deficiency   Continues tesosterone replacement from the [facility] No clinical changes       Routine general medical examination at a health care facility - Primary   Reviewed health habits including diet and exercise and skin cancer prevention Reviewed appropriate screening tests for age  Also reviewed health mt list, fam hx and immunization status , as well as social and family history   See HPI Labs reviewed and ordered Health Maintenance  Topic Date Due   COVID-19 Vaccine (1) 02/07/2026*   Medicare Annual Wellness Visit  06/17/2025   DTaP/Tdap/Td vaccine (3 - Td or Tdap) 03/17/2033   Pneumococcal Vaccine for age over 46  Completed   Flu Shot  Completed   Hepatitis C Screening  Completed   Zoster (Shingles) Vaccine  Completed   Meningitis B Vaccine  Aged Out   Colon Cancer Screening  Discontinued  *Topic was postponed. The date shown is not the original due date.    Psa added to labs  Discussed fall prevention, supplements and exercise for bone density  Encouraged to start strength training program  PHQ 3 (some fatigue and increase desire for sweets but does not feel depressed)        Prostate cancer screening   Psa today  No clinical changes Goes to the [facility] Also has testosterone  supplement       Relevant Orders   PSA, Medicare (Completed)   Gout   Under rheum care  Uric acid controlled with allopurinol  No recent flares   Continues hydrochlorothiazide  for blood pressure            [1]  Social History Tobacco Use   Smoking status: Former    Current packs/day: 0.00    Types: Cigarettes    Quit date: 09/15/1988    Years since quitting: 35.8    Passive exposure: Never   Smokeless tobacco: Never   Tobacco comments:    Off and on occasional smoker from about age 46- age 69.  Vaping Use   Vaping status: Never Used  Substance Use Topics   Alcohol use: Not Currently  Alcohol/week: 2.0 standard drinks of alcohol    Comment: Very rarely use alcohol   Drug use: Never  [2]  Allergies Allergen Reactions   Clindamycin/Lincomycin Rash   Latex Rash    Tape/Bandages if left on too long  [3]  Current Outpatient Medications on File Prior to Visit  Medication Sig Dispense Refill   allopurinol  (ZYLOPRIM ) 300 MG tablet ONE TABLET BY MOUTH ONE TIME DAILY 90 tablet 1   ANDROGEL  PUMP 20.25 MG/ACT (1.62%) GEL APPLY 4 PUMPS ONCE DAILY AS DIRECTED. 450 g 0   aspirin 81 MG tablet Take 81 mg by mouth daily.     cetirizine (ZYRTEC) 10 MG tablet Take 10 mg by mouth daily as needed for allergies.     colchicine  0.6 MG tablet Take 1 tablet (0.6 mg total) by mouth daily. (Patient taking differently: Take 0.6 mg by mouth daily as needed.) 90 tablet 0   Cyanocobalamin (VITAMIN B-12 PO) Take by mouth daily.     doxazosin (CARDURA) 8 MG tablet Take 8 mg by mouth daily.     fluticasone  (FLONASE ) 50 MCG/ACT nasal spray instill 1 spray into each nostril once daily 16 g 1   hydrochlorothiazide  (HYDRODIURIL ) 25 MG tablet Take 25 mg by mouth daily.     hydrocortisone  2.5 % cream Apply topically 2 (two) times daily. 30 g 0   hydrOXYzine  (VISTARIL ) 25 MG capsule Take 1 capsule (25 mg total) by mouth every 8 (eight) hours as needed for itching. Caution of sedation 20 capsule 0   levothyroxine (SYNTHROID) 25 MCG tablet Take 25 mcg by mouth daily before breakfast.     Melatonin 5 MG/15ML LIQD Take 15 mg by mouth at bedtime as needed.     meloxicam (MOBIC) 15 MG tablet Take 15 mg by  mouth as needed for pain.     Multiple Vitamin (MULTIVITAMIN) tablet Take 1 tablet by mouth daily.     tretinoin  (RETIN-A ) 0.025 % cream Apply pea sized amount to face nightly as tolerated 45 g 11   Turmeric Curcumin 500 MG CAPS Take by mouth daily.     vitamin E 180 MG (400 UNITS) capsule Take 400 Units by mouth daily.     ZINC-VITAMIN C PO Take 2 capsules by mouth daily. With vitamin D, Quercetin     [DISCONTINUED] lisinopril-hydrochlorothiazide  (PRINZIDE,ZESTORETIC) 10-12.5 MG per tablet Take 1 tablet by mouth daily.     No current facility-administered medications on file prior to visit.   "

## 2024-06-28 NOTE — Assessment & Plan Note (Signed)
 Reviewed health habits including diet and exercise and skin cancer prevention Reviewed appropriate screening tests for age  Also reviewed health mt list, fam hx and immunization status , as well as social and family history   See HPI Labs reviewed and ordered Health Maintenance  Topic Date Due   COVID-19 Vaccine (1) 02/07/2026*   Medicare Annual Wellness Visit  06/17/2025   DTaP/Tdap/Td vaccine (3 - Td or Tdap) 03/17/2033   Pneumococcal Vaccine for age over 75  Completed   Flu Shot  Completed   Hepatitis C Screening  Completed   Zoster (Shingles) Vaccine  Completed   Meningitis B Vaccine  Aged Out   Colon Cancer Screening  Discontinued  *Topic was postponed. The date shown is not the original due date.    Psa added to labs  Discussed fall prevention, supplements and exercise for bone density  Encouraged to start strength training program  PHQ 3 (some fatigue and increase desire for sweets but does not feel depressed)

## 2024-06-28 NOTE — Assessment & Plan Note (Signed)
 Under rheum care  Uric acid controlled with allopurinol  No recent flares   Continues hydrochlorothiazide  for blood pressure

## 2024-06-30 ENCOUNTER — Encounter: Payer: Self-pay | Admitting: Family Medicine

## 2024-06-30 ENCOUNTER — Ambulatory Visit: Payer: Self-pay | Admitting: Family Medicine

## 2024-06-30 DIAGNOSIS — E785 Hyperlipidemia, unspecified: Secondary | ICD-10-CM | POA: Insufficient documentation

## 2024-08-29 ENCOUNTER — Ambulatory Visit: Admitting: Dermatology

## 2024-10-16 ENCOUNTER — Ambulatory Visit: Admitting: Rheumatology

## 2025-03-25 ENCOUNTER — Ambulatory Visit: Admitting: Dermatology

## 2025-06-30 ENCOUNTER — Encounter: Admitting: Family Medicine

## 2025-06-30 ENCOUNTER — Ambulatory Visit
